# Patient Record
Sex: Female | Born: 1950 | Race: White | Hispanic: No | Marital: Married | Smoking: Never smoker
Health system: Southern US, Community
[De-identification: ages and names within clinical notes are randomized; demographics above are authoritative.]

## PROBLEM LIST (undated history)

## (undated) DIAGNOSIS — M1990 Unspecified osteoarthritis, unspecified site: Secondary | ICD-10-CM

## (undated) DIAGNOSIS — E119 Type 2 diabetes mellitus without complications: Secondary | ICD-10-CM

## (undated) DIAGNOSIS — F5104 Psychophysiologic insomnia: Secondary | ICD-10-CM

## (undated) DIAGNOSIS — G35 Multiple sclerosis: Secondary | ICD-10-CM

## (undated) DIAGNOSIS — G4730 Sleep apnea, unspecified: Secondary | ICD-10-CM

## (undated) DIAGNOSIS — Z8781 Personal history of (healed) traumatic fracture: Secondary | ICD-10-CM

## (undated) DIAGNOSIS — I10 Essential (primary) hypertension: Secondary | ICD-10-CM

## (undated) DIAGNOSIS — L719 Rosacea, unspecified: Secondary | ICD-10-CM

## (undated) DIAGNOSIS — G709 Myoneural disorder, unspecified: Secondary | ICD-10-CM

## (undated) DIAGNOSIS — M722 Plantar fascial fibromatosis: Secondary | ICD-10-CM

## (undated) DIAGNOSIS — H409 Unspecified glaucoma: Secondary | ICD-10-CM

## (undated) DIAGNOSIS — Z8711 Personal history of peptic ulcer disease: Secondary | ICD-10-CM

## (undated) DIAGNOSIS — Z87898 Personal history of other specified conditions: Secondary | ICD-10-CM

## (undated) DIAGNOSIS — R32 Unspecified urinary incontinence: Secondary | ICD-10-CM

## (undated) DIAGNOSIS — O00109 Unspecified tubal pregnancy without intrauterine pregnancy: Secondary | ICD-10-CM

## (undated) DIAGNOSIS — H269 Unspecified cataract: Secondary | ICD-10-CM

## (undated) DIAGNOSIS — M26609 Unspecified temporomandibular joint disorder, unspecified side: Secondary | ICD-10-CM

## (undated) DIAGNOSIS — G43909 Migraine, unspecified, not intractable, without status migrainosus: Secondary | ICD-10-CM

## (undated) DIAGNOSIS — T7840XA Allergy, unspecified, initial encounter: Secondary | ICD-10-CM

## (undated) DIAGNOSIS — E785 Hyperlipidemia, unspecified: Secondary | ICD-10-CM

## (undated) DIAGNOSIS — E669 Obesity, unspecified: Secondary | ICD-10-CM

## (undated) HISTORY — DX: Personal history of other specified conditions: Z87.898

## (undated) HISTORY — DX: Allergy, unspecified, initial encounter: T78.40XA

## (undated) HISTORY — DX: Rosacea, unspecified: L71.9

## (undated) HISTORY — PX: BRAIN SURGERY: SHX531

## (undated) HISTORY — PX: APPENDECTOMY: SHX54

## (undated) HISTORY — DX: Unspecified temporomandibular joint disorder, unspecified side: M26.609

## (undated) HISTORY — PX: GALLBLADDER SURGERY: SHX652

## (undated) HISTORY — DX: Obesity, unspecified: E66.9

## (undated) HISTORY — DX: Unspecified tubal pregnancy without intrauterine pregnancy: O00.109

## (undated) HISTORY — DX: Unspecified osteoarthritis, unspecified site: M19.90

## (undated) HISTORY — DX: Essential (primary) hypertension: I10

## (undated) HISTORY — DX: Plantar fascial fibromatosis: M72.2

## (undated) HISTORY — DX: Unspecified glaucoma: H40.9

## (undated) HISTORY — DX: Personal history of peptic ulcer disease: Z87.11

## (undated) HISTORY — PX: FRACTURE SURGERY: SHX138

## (undated) HISTORY — DX: Unspecified urinary incontinence: R32

## (undated) HISTORY — PX: EYE SURGERY: SHX253

## (undated) HISTORY — DX: Multiple sclerosis: G35

## (undated) HISTORY — DX: Sleep apnea, unspecified: G47.30

## (undated) HISTORY — DX: Myoneural disorder, unspecified: G70.9

## (undated) HISTORY — DX: Migraine, unspecified, not intractable, without status migrainosus: G43.909

## (undated) HISTORY — DX: Psychophysiologic insomnia: F51.04

## (undated) HISTORY — PX: CHOLECYSTECTOMY: SHX55

## (undated) HISTORY — DX: Personal history of (healed) traumatic fracture: Z87.81

## (undated) HISTORY — PX: OTHER SURGICAL HISTORY: SHX169

## (undated) HISTORY — DX: Hyperlipidemia, unspecified: E78.5

## (undated) HISTORY — DX: Type 2 diabetes mellitus without complications: E11.9

## (undated) HISTORY — DX: Unspecified cataract: H26.9

---

## 1992-08-13 LAB — HM PAP SMEAR

## 1995-03-03 LAB — HM PAP SMEAR

## 1997-10-28 ENCOUNTER — Ambulatory Visit (HOSPITAL_COMMUNITY): Admission: RE | Admit: 1997-10-28 | Discharge: 1997-10-28 | Payer: Self-pay | Admitting: Obstetrics and Gynecology

## 1998-04-27 ENCOUNTER — Encounter: Payer: Self-pay | Admitting: Obstetrics and Gynecology

## 1998-04-27 ENCOUNTER — Ambulatory Visit (HOSPITAL_COMMUNITY): Admission: RE | Admit: 1998-04-27 | Discharge: 1998-04-27 | Payer: Self-pay | Admitting: Obstetrics and Gynecology

## 1999-05-19 ENCOUNTER — Other Ambulatory Visit: Admission: RE | Admit: 1999-05-19 | Discharge: 1999-05-19 | Payer: Self-pay | Admitting: Family Medicine

## 1999-05-19 ENCOUNTER — Ambulatory Visit (HOSPITAL_COMMUNITY): Admission: RE | Admit: 1999-05-19 | Discharge: 1999-05-19 | Payer: Self-pay | Admitting: Family Medicine

## 1999-05-19 ENCOUNTER — Encounter: Payer: Self-pay | Admitting: Family Medicine

## 2000-05-30 ENCOUNTER — Encounter: Payer: Self-pay | Admitting: Family Medicine

## 2000-05-30 ENCOUNTER — Ambulatory Visit (HOSPITAL_COMMUNITY): Admission: RE | Admit: 2000-05-30 | Discharge: 2000-05-30 | Payer: Self-pay | Admitting: Family Medicine

## 2001-06-05 ENCOUNTER — Ambulatory Visit (HOSPITAL_COMMUNITY): Admission: RE | Admit: 2001-06-05 | Discharge: 2001-06-05 | Payer: Self-pay | Admitting: Family Medicine

## 2001-06-05 ENCOUNTER — Encounter: Payer: Self-pay | Admitting: Family Medicine

## 2002-07-11 ENCOUNTER — Encounter: Payer: Self-pay | Admitting: Family Medicine

## 2002-07-11 ENCOUNTER — Ambulatory Visit (HOSPITAL_COMMUNITY): Admission: RE | Admit: 2002-07-11 | Discharge: 2002-07-11 | Payer: Self-pay | Admitting: Family Medicine

## 2003-03-28 ENCOUNTER — Encounter: Payer: Self-pay | Admitting: Family Medicine

## 2003-03-28 ENCOUNTER — Ambulatory Visit (HOSPITAL_COMMUNITY): Admission: RE | Admit: 2003-03-28 | Discharge: 2003-03-28 | Payer: Self-pay | Admitting: Family Medicine

## 2003-05-11 ENCOUNTER — Ambulatory Visit (HOSPITAL_COMMUNITY): Admission: RE | Admit: 2003-05-11 | Discharge: 2003-05-11 | Payer: Self-pay | Admitting: Family Medicine

## 2003-05-11 ENCOUNTER — Encounter: Payer: Self-pay | Admitting: Family Medicine

## 2003-06-18 ENCOUNTER — Ambulatory Visit (HOSPITAL_COMMUNITY): Admission: RE | Admit: 2003-06-18 | Discharge: 2003-06-18 | Payer: Self-pay | Admitting: Neurology

## 2003-11-04 ENCOUNTER — Ambulatory Visit (HOSPITAL_COMMUNITY): Admission: RE | Admit: 2003-11-04 | Discharge: 2003-11-04 | Payer: Self-pay | Admitting: Neurology

## 2004-03-29 ENCOUNTER — Ambulatory Visit (HOSPITAL_COMMUNITY): Admission: RE | Admit: 2004-03-29 | Discharge: 2004-03-29 | Payer: Self-pay | Admitting: Obstetrics and Gynecology

## 2004-06-07 ENCOUNTER — Encounter: Admission: RE | Admit: 2004-06-07 | Discharge: 2004-06-07 | Payer: Self-pay | Admitting: Neurology

## 2004-09-23 ENCOUNTER — Ambulatory Visit: Payer: Self-pay | Admitting: Cardiology

## 2004-10-01 ENCOUNTER — Ambulatory Visit: Payer: Self-pay

## 2004-11-28 ENCOUNTER — Ambulatory Visit (HOSPITAL_COMMUNITY): Admission: RE | Admit: 2004-11-28 | Discharge: 2004-11-28 | Payer: Self-pay | Admitting: Neurology

## 2005-01-16 ENCOUNTER — Ambulatory Visit (HOSPITAL_COMMUNITY): Admission: RE | Admit: 2005-01-16 | Discharge: 2005-01-16 | Payer: Self-pay | Admitting: Neurology

## 2005-04-05 ENCOUNTER — Ambulatory Visit (HOSPITAL_COMMUNITY): Admission: RE | Admit: 2005-04-05 | Discharge: 2005-04-05 | Payer: Self-pay | Admitting: Obstetrics and Gynecology

## 2005-04-13 ENCOUNTER — Ambulatory Visit: Payer: Self-pay | Admitting: Cardiology

## 2005-04-19 ENCOUNTER — Ambulatory Visit: Payer: Self-pay | Admitting: Cardiology

## 2005-07-06 ENCOUNTER — Ambulatory Visit: Payer: Self-pay | Admitting: Cardiology

## 2005-09-14 ENCOUNTER — Ambulatory Visit: Payer: Self-pay | Admitting: Cardiology

## 2005-10-26 ENCOUNTER — Ambulatory Visit: Payer: Self-pay | Admitting: Cardiology

## 2006-01-07 ENCOUNTER — Emergency Department (HOSPITAL_COMMUNITY): Admission: EM | Admit: 2006-01-07 | Discharge: 2006-01-07 | Payer: Self-pay | Admitting: Family Medicine

## 2006-02-28 ENCOUNTER — Encounter: Admission: RE | Admit: 2006-02-28 | Discharge: 2006-05-29 | Payer: Self-pay | Admitting: Neurology

## 2006-04-07 ENCOUNTER — Ambulatory Visit (HOSPITAL_COMMUNITY): Admission: RE | Admit: 2006-04-07 | Discharge: 2006-04-07 | Payer: Self-pay | Admitting: Obstetrics and Gynecology

## 2006-04-13 ENCOUNTER — Ambulatory Visit: Payer: Self-pay | Admitting: Cardiology

## 2006-04-18 ENCOUNTER — Ambulatory Visit: Payer: Self-pay | Admitting: Cardiology

## 2006-05-03 ENCOUNTER — Ambulatory Visit: Payer: Self-pay | Admitting: Cardiology

## 2006-05-12 ENCOUNTER — Ambulatory Visit: Payer: Self-pay | Admitting: Cardiology

## 2006-06-02 ENCOUNTER — Encounter: Admission: RE | Admit: 2006-06-02 | Discharge: 2006-06-02 | Payer: Self-pay | Admitting: Orthopedic Surgery

## 2006-10-11 ENCOUNTER — Ambulatory Visit (HOSPITAL_COMMUNITY): Admission: RE | Admit: 2006-10-11 | Discharge: 2006-10-11 | Payer: Self-pay | Admitting: Neurology

## 2006-11-01 ENCOUNTER — Encounter
Admission: RE | Admit: 2006-11-01 | Discharge: 2007-01-30 | Payer: Self-pay | Admitting: Physical Medicine & Rehabilitation

## 2006-11-01 ENCOUNTER — Ambulatory Visit: Payer: Self-pay | Admitting: Physical Medicine & Rehabilitation

## 2007-01-17 ENCOUNTER — Ambulatory Visit: Payer: Self-pay | Admitting: Cardiology

## 2007-03-16 LAB — HM HEPATITIS C SCREENING LAB: HM Hepatitis Screen: NEGATIVE

## 2007-07-23 ENCOUNTER — Ambulatory Visit: Payer: Self-pay | Admitting: Cardiology

## 2007-08-09 ENCOUNTER — Encounter: Admission: RE | Admit: 2007-08-09 | Discharge: 2007-08-09 | Payer: Self-pay | Admitting: Family Medicine

## 2007-08-17 ENCOUNTER — Encounter: Admission: RE | Admit: 2007-08-17 | Discharge: 2007-08-17 | Payer: Self-pay | Admitting: Family Medicine

## 2008-01-16 ENCOUNTER — Ambulatory Visit: Payer: Self-pay | Admitting: Cardiology

## 2008-01-17 ENCOUNTER — Ambulatory Visit: Payer: Self-pay | Admitting: Cardiology

## 2008-01-17 LAB — CONVERTED CEMR LAB
BUN: 17 mg/dL (ref 6–23)
CO2: 31 meq/L (ref 19–32)
Creatinine, Ser: 0.9 mg/dL (ref 0.4–1.2)
Glucose, Bld: 106 mg/dL — ABNORMAL HIGH (ref 70–99)
LDL Cholesterol: 107 mg/dL — ABNORMAL HIGH (ref 0–99)
Potassium: 3.7 meq/L (ref 3.5–5.1)
Sodium: 142 meq/L (ref 135–145)
Total CHOL/HDL Ratio: 4.6
Total Protein: 7.2 g/dL (ref 6.0–8.3)
Triglycerides: 136 mg/dL (ref 0–149)
VLDL: 27 mg/dL (ref 0–40)

## 2008-08-23 LAB — HM PAP SMEAR

## 2008-12-26 ENCOUNTER — Telehealth (INDEPENDENT_AMBULATORY_CARE_PROVIDER_SITE_OTHER): Payer: Self-pay | Admitting: *Deleted

## 2009-01-14 ENCOUNTER — Encounter (INDEPENDENT_AMBULATORY_CARE_PROVIDER_SITE_OTHER): Payer: Self-pay | Admitting: *Deleted

## 2009-01-14 ENCOUNTER — Encounter: Payer: Self-pay | Admitting: Cardiology

## 2009-01-14 LAB — CONVERTED CEMR LAB
ALT: 50 units/L
BUN: 19 mg/dL
CO2: 23 meq/L
Creatinine, Ser: 1.05 mg/dL
Eosinophils Relative: 2 %
HCT: 39.3 %
Hemoglobin: 14 g/dL
Lymphocytes, automated: 39 %
MCV: 89 fL
Monocytes Relative: 9 %
Neutrophils Relative %: 49 %
Platelets: 194 10*3/uL
Potassium: 3.5 meq/L
RBC: 4.44 M/uL
Total Bilirubin: 0.2 mg/dL
Total Protein: 6.9 g/dL

## 2009-01-30 ENCOUNTER — Encounter: Payer: Self-pay | Admitting: Cardiology

## 2009-01-30 ENCOUNTER — Ambulatory Visit (HOSPITAL_COMMUNITY): Admission: RE | Admit: 2009-01-30 | Discharge: 2009-01-30 | Payer: Self-pay | Admitting: Neurology

## 2009-02-06 ENCOUNTER — Ambulatory Visit (HOSPITAL_COMMUNITY): Payer: Self-pay | Admitting: Licensed Clinical Social Worker

## 2009-02-16 ENCOUNTER — Ambulatory Visit (HOSPITAL_COMMUNITY): Payer: Self-pay | Admitting: Licensed Clinical Social Worker

## 2009-02-20 DIAGNOSIS — R0609 Other forms of dyspnea: Secondary | ICD-10-CM | POA: Insufficient documentation

## 2009-02-20 DIAGNOSIS — R5381 Other malaise: Secondary | ICD-10-CM

## 2009-02-20 DIAGNOSIS — R0989 Other specified symptoms and signs involving the circulatory and respiratory systems: Secondary | ICD-10-CM | POA: Insufficient documentation

## 2009-02-20 DIAGNOSIS — M129 Arthropathy, unspecified: Secondary | ICD-10-CM

## 2009-02-20 DIAGNOSIS — K219 Gastro-esophageal reflux disease without esophagitis: Secondary | ICD-10-CM

## 2009-02-20 DIAGNOSIS — R350 Frequency of micturition: Secondary | ICD-10-CM

## 2009-02-20 DIAGNOSIS — F3289 Other specified depressive episodes: Secondary | ICD-10-CM | POA: Insufficient documentation

## 2009-02-20 DIAGNOSIS — F329 Major depressive disorder, single episode, unspecified: Secondary | ICD-10-CM

## 2009-02-20 DIAGNOSIS — IMO0002 Reserved for concepts with insufficient information to code with codable children: Secondary | ICD-10-CM | POA: Insufficient documentation

## 2009-02-20 DIAGNOSIS — I10 Essential (primary) hypertension: Secondary | ICD-10-CM | POA: Insufficient documentation

## 2009-02-20 DIAGNOSIS — G35 Multiple sclerosis: Secondary | ICD-10-CM

## 2009-02-20 DIAGNOSIS — R5383 Other fatigue: Secondary | ICD-10-CM | POA: Insufficient documentation

## 2009-02-20 HISTORY — DX: Multiple sclerosis: G35

## 2009-02-20 HISTORY — DX: Major depressive disorder, single episode, unspecified: F32.9

## 2009-02-20 HISTORY — DX: Other malaise: R53.81

## 2009-02-20 HISTORY — DX: Gastro-esophageal reflux disease without esophagitis: K21.9

## 2009-02-20 HISTORY — DX: Arthropathy, unspecified: M12.9

## 2009-02-20 HISTORY — DX: Other specified depressive episodes: F32.89

## 2009-02-23 ENCOUNTER — Ambulatory Visit (HOSPITAL_COMMUNITY): Payer: Self-pay | Admitting: Licensed Clinical Social Worker

## 2009-03-02 ENCOUNTER — Ambulatory Visit: Payer: Self-pay | Admitting: Cardiology

## 2009-03-02 DIAGNOSIS — R072 Precordial pain: Secondary | ICD-10-CM | POA: Insufficient documentation

## 2009-03-02 DIAGNOSIS — R079 Chest pain, unspecified: Secondary | ICD-10-CM | POA: Insufficient documentation

## 2009-03-02 HISTORY — DX: Precordial pain: R07.2

## 2009-03-03 ENCOUNTER — Encounter (HOSPITAL_COMMUNITY): Admission: RE | Admit: 2009-03-03 | Discharge: 2009-06-01 | Payer: Self-pay | Admitting: Neurology

## 2009-03-10 ENCOUNTER — Ambulatory Visit (HOSPITAL_COMMUNITY): Payer: Self-pay | Admitting: Licensed Clinical Social Worker

## 2009-03-13 ENCOUNTER — Encounter (INDEPENDENT_AMBULATORY_CARE_PROVIDER_SITE_OTHER): Payer: Self-pay | Admitting: *Deleted

## 2009-03-20 ENCOUNTER — Telehealth: Payer: Self-pay | Admitting: Cardiology

## 2009-03-24 ENCOUNTER — Ambulatory Visit (HOSPITAL_COMMUNITY): Payer: Self-pay | Admitting: Licensed Clinical Social Worker

## 2009-04-14 ENCOUNTER — Ambulatory Visit (HOSPITAL_COMMUNITY): Payer: Self-pay | Admitting: Licensed Clinical Social Worker

## 2009-04-27 ENCOUNTER — Ambulatory Visit (HOSPITAL_COMMUNITY): Payer: Self-pay | Admitting: Licensed Clinical Social Worker

## 2009-05-06 ENCOUNTER — Ambulatory Visit (HOSPITAL_COMMUNITY): Payer: Self-pay | Admitting: Psychology

## 2009-05-14 ENCOUNTER — Ambulatory Visit (HOSPITAL_COMMUNITY): Payer: Self-pay | Admitting: Psychology

## 2009-05-20 ENCOUNTER — Ambulatory Visit (HOSPITAL_COMMUNITY): Payer: Self-pay | Admitting: Psychology

## 2009-05-27 ENCOUNTER — Ambulatory Visit (HOSPITAL_COMMUNITY): Payer: Self-pay | Admitting: Psychology

## 2009-06-09 ENCOUNTER — Ambulatory Visit (HOSPITAL_COMMUNITY): Payer: Self-pay | Admitting: Psychology

## 2009-06-22 ENCOUNTER — Encounter (HOSPITAL_COMMUNITY): Admission: RE | Admit: 2009-06-22 | Discharge: 2009-09-03 | Payer: Self-pay | Admitting: Psychiatry

## 2009-06-24 ENCOUNTER — Ambulatory Visit (HOSPITAL_COMMUNITY): Payer: Self-pay | Admitting: Psychology

## 2009-07-03 ENCOUNTER — Ambulatory Visit (HOSPITAL_COMMUNITY): Payer: Self-pay | Admitting: Psychology

## 2009-07-23 ENCOUNTER — Telehealth (INDEPENDENT_AMBULATORY_CARE_PROVIDER_SITE_OTHER): Payer: Self-pay | Admitting: *Deleted

## 2009-08-10 ENCOUNTER — Encounter: Payer: Self-pay | Admitting: Cardiology

## 2009-08-15 ENCOUNTER — Ambulatory Visit (HOSPITAL_COMMUNITY): Admission: RE | Admit: 2009-08-15 | Discharge: 2009-08-15 | Payer: Self-pay | Admitting: Neurology

## 2009-09-14 ENCOUNTER — Encounter (HOSPITAL_COMMUNITY): Admission: RE | Admit: 2009-09-14 | Discharge: 2009-12-13 | Payer: Self-pay | Admitting: Neurology

## 2009-09-24 ENCOUNTER — Telehealth (INDEPENDENT_AMBULATORY_CARE_PROVIDER_SITE_OTHER): Payer: Self-pay | Admitting: *Deleted

## 2009-10-27 ENCOUNTER — Telehealth (INDEPENDENT_AMBULATORY_CARE_PROVIDER_SITE_OTHER): Payer: Self-pay | Admitting: *Deleted

## 2009-10-29 ENCOUNTER — Telehealth (INDEPENDENT_AMBULATORY_CARE_PROVIDER_SITE_OTHER): Payer: Self-pay | Admitting: *Deleted

## 2009-11-23 LAB — HM PAP SMEAR

## 2009-12-16 ENCOUNTER — Encounter (HOSPITAL_COMMUNITY): Admission: RE | Admit: 2009-12-16 | Discharge: 2010-03-16 | Payer: Self-pay | Admitting: Neurology

## 2010-01-27 ENCOUNTER — Ambulatory Visit (HOSPITAL_COMMUNITY): Admission: RE | Admit: 2010-01-27 | Discharge: 2010-01-27 | Payer: Self-pay | Admitting: Neurology

## 2010-03-16 ENCOUNTER — Ambulatory Visit (HOSPITAL_COMMUNITY): Admission: RE | Admit: 2010-03-16 | Discharge: 2010-03-16 | Payer: Self-pay | Admitting: Neurology

## 2010-04-07 ENCOUNTER — Encounter (HOSPITAL_COMMUNITY): Admission: RE | Admit: 2010-04-07 | Discharge: 2010-07-06 | Payer: Self-pay | Admitting: Neurology

## 2010-05-04 ENCOUNTER — Ambulatory Visit: Payer: Self-pay | Admitting: Cardiology

## 2010-05-04 DIAGNOSIS — Z9119 Patient's noncompliance with other medical treatment and regimen: Secondary | ICD-10-CM

## 2010-05-04 DIAGNOSIS — E785 Hyperlipidemia, unspecified: Secondary | ICD-10-CM

## 2010-05-04 HISTORY — DX: Patient's noncompliance with other medical treatment and regimen: Z91.19

## 2010-05-04 HISTORY — DX: Hyperlipidemia, unspecified: E78.5

## 2010-05-17 ENCOUNTER — Telehealth (INDEPENDENT_AMBULATORY_CARE_PROVIDER_SITE_OTHER): Payer: Self-pay | Admitting: *Deleted

## 2010-05-22 LAB — LAB REPORT - SCANNED: EGFR (Non-African Amer.): 63

## 2010-07-06 ENCOUNTER — Encounter
Admission: RE | Admit: 2010-07-06 | Discharge: 2010-09-02 | Payer: Self-pay | Source: Home / Self Care | Attending: Neurology | Admitting: Neurology

## 2010-07-27 ENCOUNTER — Encounter (HOSPITAL_COMMUNITY)
Admission: RE | Admit: 2010-07-27 | Discharge: 2010-10-05 | Payer: Self-pay | Source: Home / Self Care | Attending: Neurology | Admitting: Neurology

## 2010-08-18 ENCOUNTER — Ambulatory Visit (HOSPITAL_COMMUNITY)
Admission: RE | Admit: 2010-08-18 | Discharge: 2010-08-18 | Payer: Self-pay | Source: Home / Self Care | Attending: Neurosurgery | Admitting: Neurosurgery

## 2010-08-25 ENCOUNTER — Inpatient Hospital Stay (HOSPITAL_COMMUNITY)
Admission: RE | Admit: 2010-08-25 | Discharge: 2010-08-25 | Payer: Self-pay | Source: Home / Self Care | Attending: Neurosurgery | Admitting: Neurosurgery

## 2010-09-24 ENCOUNTER — Encounter
Admission: RE | Admit: 2010-09-24 | Discharge: 2010-10-05 | Payer: Self-pay | Source: Home / Self Care | Attending: Neurology | Admitting: Neurology

## 2010-09-26 ENCOUNTER — Encounter: Payer: Self-pay | Admitting: Family Medicine

## 2010-09-26 ENCOUNTER — Encounter: Payer: Self-pay | Admitting: Neurology

## 2010-09-27 ENCOUNTER — Ambulatory Visit (HOSPITAL_COMMUNITY)
Admission: RE | Admit: 2010-09-27 | Discharge: 2010-09-27 | Payer: Self-pay | Source: Home / Self Care | Attending: Neurology | Admitting: Neurology

## 2010-09-28 ENCOUNTER — Ambulatory Visit
Admission: RE | Admit: 2010-09-28 | Discharge: 2010-09-28 | Payer: Self-pay | Source: Home / Self Care | Attending: Psychology | Admitting: Psychology

## 2010-10-05 ENCOUNTER — Ambulatory Visit: Admit: 2010-10-05 | Payer: Self-pay | Admitting: Psychology

## 2010-10-05 NOTE — Assessment & Plan Note (Signed)
Summary: per check out/sf  Medications Added TYSABRI 300 MG/15ML CONC (NATALIZUMAB) q monthly for MS ZONISAMIDE 100 MG CAPS (ZONISAMIDE) 1 tab once daily w/3 tabs of 25 to equal 175 mg total ZONISAMIDE 25 MG CAPS (ZONISAMIDE) 3 caps once daily w/100mg  tab to equal 175        Visit Type:  1 yr f/u Primary Provider:  Windle Guard MD  CC:  pt states she had cp x 2 wks back in May however states that she almost called our office but decided not to because she states she was not going to do what the Dr would have suggested so she decided not to call our office not go to the ED pt states she had sob and arm pain during this 2 wks.Marland KitchenMarland KitchenMarland KitchenMarland Kitchenpt lost 13 lb since 02/2009.  History of Present Illness: Ms. Gravlin returns today for further evaluation and management of her hypertension, chest pain, and mixed hyperlipidemia.  She tells today that she has had chest pain in May which he calls about. It was worrisome enough that we told her to go the emergency room. She decided not to go. She tells my clinical assistant today that she is not going to do with the doctors tell her to do regardless.  She will stop her atenolol so she can go on a multiple sclerosis drugs. Her blood pressures been under good control and she has lost 13 pounds since June of 2010. She brings in her blood pressure log today.  She's also stopped her simvastatin. She did not tell her primary care about this as well.    Current Medications (verified): 1)  Diovan 320 Mg Tabs (Valsartan) .Marland Kitchen.. 1 Tab Once Daily 2)  Atenolol-Chlorthalidone 50-25 Mg Tabs (Atenolol-Chlorthalidone) .... 1/2 Tab Every Morning 3)  Relpax 40 Mg Tabs (Eletriptan Hydrobromide) .... As Needed 4)  Tysabri 300 Mg/26ml Conc (Natalizumab) .... Q Monthly For Ms 5)  Zonisamide 100 Mg Caps (Zonisamide) .Marland Kitchen.. 1 Tab Once Daily W/3 Tabs of 25 To Equal 175 Mg Total 6)  Zonisamide 25 Mg Caps (Zonisamide) .... 3 Caps Once Daily W/100mg  Tab To Equal 175  Allergies: 1)  !  Niacin  Past History:  Past Medical History: Last updated: 02/20/2009 HYPERTENSION, UNSPECIFIED (ICD-401.9) DYSPNEA ON EXERTION (ICD-786.09) FATIGUE (ICD-780.79) GERD (ICD-530.81) DEPRESSION (ICD-311) ARTHRITIS (ICD-716.90) DEGENERATIVE DISC DISEASE (ICD-722.6) MULTIPLE SCLEROSIS (ICD-340) FREQUENCY, URINARY (ICD-788.41) Ulcer..during college yrs  Past Surgical History: Last updated: 02/20/2009 Lumbar puncture... 06/18/2003 ACUPUNCTURE TREATMENT #2.Marland Kitchen 11/07/2006 Acupuncture treatment #3... 11/10/2006 Acupuncture treatment #4.Marland Kitchen 11/17/2006 Appendectomy at age 23 Repair of leg Fx < 57yr Repair of depressed skull Fx < 65yr Cholecystectomy..1984 Ectopic pregnancy rupture..1984 Pylonidal cyst removal..1973 3 Vaginal births, 64, 48, 89  Family History: Last updated: 02/20/2009 Pt is adopted says birth family sketchy. Mother: alive and in her 60's..says mother and 2 sisters' have heart disease Father: deceased ?28's..liver cancer Siblings: 2 sisters deceased..1 sister..lung and brain ca                2nd sister MI and COPD                2 other sisters alive ? 80's                1 brother alive ? 37's  Social History: Last updated: 02/20/2009 Full Time Married  Tobacco Use - No.  Alcohol Use - no Regular Exercise - yes Drug Use - no  Risk Factors: Exercise: yes (02/20/2009)  Risk Factors: Smoking Status: never (02/20/2009)  Review of Systems       negative other than history of present illness  Vital Signs:  Patient profile:   60 year old female Height:      67 inches Weight:      168.8 pounds BMI:     26.53 Pulse rate:   65 / minute Pulse rhythm:   irregular BP sitting:   104 / 70  (left arm) Cuff size:   large  Vitals Entered By: Danielle Rankin, CMA (May 04, 2010 9:40 AM)  Physical Exam  General:  no acute distress, she has lost weight Head:  normocephalic and atraumatic Eyes:  PERRLA/EOM intact; conjunctiva and lids normal. Neck:  Neck supple,  no JVD. No masses, thyromegaly or abnormal cervical nodes. Chest Tenesia Escudero:  no deformities or breast masses noted Lungs:  Clear bilaterally to auscultation and percussion. Heart:  PMI nondisplaced, regular rate and rhythm, no murmur rub or gallop. Carotids equal bilaterally without bruit Msk:  decreased ROM.   Pulses:  pulses normal in all 4 extremities Extremities:  No clubbing or cyanosis. Neurologic:  Alert and oriented x 3. Skin:  Intact without lesions or rashes. Psych:  agitated.  alert oriented x3   Problems:  Medical Problems Added: 1)  Dx of Pers Hx Noncompliance W/med Tx [facility]s [facility]  (ICD-V15.81) 2)  Dx of Hyperlipidemia-mixed  (ICD-272.4)  Impression & Recommendations:  Problem # 1:  CHEST PAIN-PRECORDIAL (ICD-786.51) Assessment Improved She Has not had any further chest pain since May. She was advised at that time to go to the ED but refused. No further evaluation this time. The following medications were removed from the medication list:    Atenolol-chlorthalidone 50-25 Mg Tabs (Atenolol-chlorthalidone) .Marland Kitchen... 1/2 tab every morning  Problem # 2:  HYPERTENSION, UNSPECIFIED (ICD-401.9) Assessment: Improved Hopefully she will have good blood pressure control off the atenolol chlorthalidone. She will monitor this. The following medications were removed from the medication list:    Atenolol-chlorthalidone 50-25 Mg Tabs (Atenolol-chlorthalidone) .Marland Kitchen... 1/2 tab every morning Her updated medication list for this problem includes:    Diovan 320 Mg Tabs (Valsartan) .Marland Kitchen... 1 tab once daily  Orders: EKG w/ Interpretation (93000)  Problem # 3:  MULTIPLE SCLEROSIS (ICD-340) Hopefully coming off the beta block will allow her to go on p.o. multiple sclerosis drug.   Problem # 4:  HYPERLIPIDEMIA-MIXED (ICD-272.4) She has taken herself off the simvastatin. No blood work or followup necessary. The following medications were removed from the medication list:    Simvastatin 10 Mg  Tabs (Simvastatin) .Marland Kitchen... 1 tab once daily  Problem # 5:  PERS HX NONCOMPLIANCE W/MED TX PRS [facility] (ICD-V15.81)  Clinical Reports Reviewed:  Nuclear Study:  11/19/2003:  Interpretation: Clinically and electrocardiographically negative for ischemia. The patient did, however, have significant baseline hypertension with a hypertensive blood pressure response. Perfusion data were esssentially negative for ischemia. There was a small anteroapical defect, which likely represents breast attenuation. There was no associated Maija Biggers motion abnormality.  Learta Codding, MD   Patient Instructions: 1)  Your physician recommends that you schedule a follow-up appointment in: as needed with Dr. Daleen Squibb 2)  Your physician recommends that you continue on your current medications as directed. Please refer to the Current Medication list given to you today.

## 2010-10-05 NOTE — Progress Notes (Signed)
   Pt Completed ROI, I copied records and LMOM for her to Pick up. [facility] Mes[place]ore  May 17, 2010 9:48 AM

## 2010-10-05 NOTE — Progress Notes (Signed)
Summary: Records release to [facility]     Follow-up for Phone Call       Follow-up by: Lenard Forth    Attempts were made to resolve issue of records not being released in a timely matter. Second request received at [facility] 09/24/09, stamped as received by [employer] 09/28/09-ID# [id number]. Records processed 10/16/09, sent to [facility] in [place] for release. Records not released until 2/20. [facility] could not tell me a reason for the delay in releasing the information so I asked for a Supervisor to return my call. Pincus Badder from [facility] left a voicemail 2/23 and explained [place] had a snowstorm that hindered their processes.  Miss Tozzi came to [facility] Wednesday 2/23 to present the final request letter received from [facility].  After calling [facility] because patient had received third and final warning 2/19-postmarked 2/15, a decision was made to fax the records.Patient was notified after confirmation was received confirming receipt of records from [place] representing [employer] who will process her claim.  Appended Document: Records release to [facility] Received call back from [facility]. Bill for $55.65 will be written off due to exceeding time limits.

## 2010-10-05 NOTE — Progress Notes (Signed)
   Spoke with Pt.10/26/09 & 10/27/09, SHe has been trying to get answers on her Disability, [facility] says they have not recieved the records, in the system it says they were mailed out on 10/16/09, I have told pt that, she does not wont to leave a message she would like some answers.I have also spoken with Renne @ (10/27/09) [facility] she said all she can do is take a message and give it to [place] tomorrow when she returns.I told pt I will try and get some answers for her and return her call @ [phone]. I have aslo told her I will speak with Almira Coaster and see if something can be resolved. Cala Bradford Mesiemore  October 27, 2009 12:59 PM

## 2010-10-05 NOTE — Progress Notes (Signed)
   Pt.Dropped off a Disability paper from [facility] @ front Desk, She signed a ROI and I have forwarded to [facility]. Cala Bradford Mesiemore  September 24, 2009 10:46 AM

## 2010-10-11 ENCOUNTER — Encounter (HOSPITAL_COMMUNITY): Payer: 59 | Attending: Neurology

## 2010-10-11 DIAGNOSIS — G35 Multiple sclerosis: Secondary | ICD-10-CM | POA: Insufficient documentation

## 2010-10-11 DIAGNOSIS — Z79899 Other long term (current) drug therapy: Secondary | ICD-10-CM | POA: Insufficient documentation

## 2010-10-12 ENCOUNTER — Ambulatory Visit (INDEPENDENT_AMBULATORY_CARE_PROVIDER_SITE_OTHER): Payer: 59 | Admitting: Psychology

## 2010-10-12 DIAGNOSIS — F331 Major depressive disorder, recurrent, moderate: Secondary | ICD-10-CM

## 2010-10-13 ENCOUNTER — Ambulatory Visit: Payer: 59 | Attending: Neurology

## 2010-10-13 DIAGNOSIS — IMO0001 Reserved for inherently not codable concepts without codable children: Secondary | ICD-10-CM | POA: Insufficient documentation

## 2010-10-13 DIAGNOSIS — R269 Unspecified abnormalities of gait and mobility: Secondary | ICD-10-CM | POA: Insufficient documentation

## 2010-10-13 DIAGNOSIS — M6281 Muscle weakness (generalized): Secondary | ICD-10-CM | POA: Insufficient documentation

## 2010-10-19 ENCOUNTER — Ambulatory Visit (INDEPENDENT_AMBULATORY_CARE_PROVIDER_SITE_OTHER): Payer: 59 | Admitting: Psychology

## 2010-10-19 DIAGNOSIS — F331 Major depressive disorder, recurrent, moderate: Secondary | ICD-10-CM

## 2010-10-26 ENCOUNTER — Ambulatory Visit (INDEPENDENT_AMBULATORY_CARE_PROVIDER_SITE_OTHER): Payer: 59 | Admitting: Psychology

## 2010-10-26 DIAGNOSIS — F331 Major depressive disorder, recurrent, moderate: Secondary | ICD-10-CM

## 2010-11-03 ENCOUNTER — Ambulatory Visit (INDEPENDENT_AMBULATORY_CARE_PROVIDER_SITE_OTHER): Payer: 59 | Admitting: Psychology

## 2010-11-03 DIAGNOSIS — F331 Major depressive disorder, recurrent, moderate: Secondary | ICD-10-CM

## 2010-11-08 ENCOUNTER — Encounter (HOSPITAL_COMMUNITY)
Admission: RE | Admit: 2010-11-08 | Discharge: 2010-11-08 | Disposition: A | Discharge status: Home / Self Care | Payer: 59 | Source: Ambulatory Visit | Attending: Neurology | Admitting: Neurology

## 2010-11-08 DIAGNOSIS — Z79899 Other long term (current) drug therapy: Secondary | ICD-10-CM | POA: Insufficient documentation

## 2010-11-08 DIAGNOSIS — G35 Multiple sclerosis: Secondary | ICD-10-CM | POA: Insufficient documentation

## 2010-11-15 LAB — CBC
HCT: 39.4 % (ref 36.0–46.0)
Hemoglobin: 13.6 g/dL (ref 12.0–15.0)
MCH: 30.4 pg (ref 26.0–34.0)
RBC: 4.47 MIL/uL (ref 3.87–5.11)

## 2010-11-15 LAB — URINALYSIS, ROUTINE W REFLEX MICROSCOPIC
Hgb urine dipstick: NEGATIVE
Nitrite: NEGATIVE
Specific Gravity, Urine: 1.016 (ref 1.005–1.030)
Urobilinogen, UA: 0.2 mg/dL (ref 0.0–1.0)

## 2010-11-15 LAB — BASIC METABOLIC PANEL
BUN: 17 mg/dL (ref 6–23)
Calcium: 9.9 mg/dL (ref 8.4–10.5)
Creatinine, Ser: 0.97 mg/dL (ref 0.4–1.2)
GFR calc Af Amer: >60 mL/min (ref >60)
Sodium: 143 mEq/L (ref 135–145)

## 2010-11-15 LAB — SURGICAL PCR SCREEN
MRSA, PCR: NEGATIVE
Staphylococcus aureus: NEGATIVE

## 2010-11-15 LAB — URINE MICROSCOPIC-ADD ON

## 2010-11-22 LAB — CREATININE, SERUM: GFR calc non Af Amer: 55 mL/min — ABNORMAL LOW (ref >60)

## 2010-11-24 LAB — MISCELLANEOUS TEST

## 2010-12-06 ENCOUNTER — Encounter (HOSPITAL_COMMUNITY): Payer: 59 | Attending: Neurology

## 2010-12-06 DIAGNOSIS — G35 Multiple sclerosis: Secondary | ICD-10-CM | POA: Insufficient documentation

## 2011-01-03 ENCOUNTER — Encounter (HOSPITAL_COMMUNITY)
Admission: RE | Admit: 2011-01-03 | Discharge: 2011-01-03 | Payer: 59 | Source: Ambulatory Visit | Attending: Neurology | Admitting: Neurology

## 2011-01-06 ENCOUNTER — Ambulatory Visit (INDEPENDENT_AMBULATORY_CARE_PROVIDER_SITE_OTHER): Payer: 59 | Admitting: Psychology

## 2011-01-06 DIAGNOSIS — F331 Major depressive disorder, recurrent, moderate: Secondary | ICD-10-CM

## 2011-01-18 NOTE — Assessment & Plan Note (Signed)
[facility]                            CARDIOLOGY OFFICE NOTE   NAME:[name], Katherine [name]                         MRN:          [id number]  DATE:01/17/2007                            DOB:          1950-10-10    [name] returns today for further management of her hypertension.   She is having more and more difficulties at work.  She says she does not  think and do her job nearly as well as she used to.  She is getting  ready to retire.  The multiple sclerosis has really become an issue.  She has had a lot of multiple areas of pain, and has actually been  through some acupuncture.  Please refer to the notes from Dr. Wynn Banker.   Her blood pressure from her checking has been, on the average, 140  systolic, 85-90 diastolic.  She does have pressures that are in the  120s, 130s.   CURRENT MEDICATIONS:  1. Atenolol chlorthalidone 50/25 a half a tablet daily.  2. Effexor XR 150 mg a day.  3. Relpax 40 mg b.i.d. p.r.n.  4. Betaseron 0.25 mg injection every other day.  5. Diovan 320 mg a day.  6. Calcium and vitamin D.   PHYSICAL EXAMINATION:  She is very emotional and tearful today.  Her blood pressure is actually good at 127/80, her pulse is 70 and  regular.  EKG is normal.  Her weight is 197, up 11.  HEENT:  Normocephalic, atraumatic.  PERRLA.  Extraocular movements  intact.  Her sclerae are injected from crying.  Facial symmetry is  normal.  NECK:  Supple.  Carotids are full without bruits.  There is no  thyromegaly.  Trachea is midline.  LUNGS:  Remarkable for expiratory rhonchi in the upper airways (she has  had a respiratory tract infection).  HEART:  Reveals a soft S1, S2, without murmur, rub, or gallop.  ABDOMINAL EXAM:  Soft, good bowel sounds.  EXTREMITIES:  Reveal no edema.  Pulses are brisk.  NEURO EXAM:  Intact.   ASSESSMENT AND PLAN:  Katherine [name]'s blood pressure is still borderline.  I think a lot of this is related to a lot of the  emotional stress and  pain that she has gone through.  I have chosen not  to change her regimen today.  I think retiring, staying at home, doing  more exercise will help.  She will keep a check on this.  I will see her  back again in 6 months.     Thomas C. Daleen Squibb, MD, [facility]  Electronically Signed    TCW/MedQ  DD: 01/17/2007  DT: 01/17/2007  Job #: [id number]   cc:   Windle Guard, M.D.

## 2011-01-18 NOTE — Assessment & Plan Note (Signed)
[facility]                            CARDIOLOGY OFFICE NOTE   NAME:Mcgonagle, Elnita Maxwell                         MRN:          [id number]  DATE:07/23/2007                            DOB:          Aug 24, 1951    Ms. Chaviano returns today for further management of her hypertension.   She has gotten really serious about her weight and is down 13 pounds.  She feels remarkably better.  Her blood pressures have been running  around 130-140 systolic over about 80.  She does not check it near as  frequently.  she has no complaints today.  She states she is going to  lose even further weight.   Her meds are unchanged since last visit.  Her hypertensive meds  including Diovan 320 mg a day, atenolol, chlorthalidone 50/25 a half a  tablet q. day.   Her blood pressure today 139/81, her pulse is 64 and regular, weight is  184.  HEENT:  Unchanged.  Carotids are full without bruits.  No JVD.  Thyroid is not enlarged.  Trachea is midline.  LUNGS:  Clear.  HEART:  Regular rate and rhythm.  ABDOMEN:  Soft.  EXTREMITIES:  No cyanosis, clubbing, or edema.  Pulses are intact.  NEURO EXAM:  Grossly intact.   I am pleased with Mrs. Delmont's weight loss.  She seems to be happier  with herself and much more positive today than she was last visit.   I will plan on seeing her back again in 6 months.     Thomas C. Daleen Squibb, MD, [facility]  Electronically Signed    TCW/MedQ  DD: 07/23/2007  DT: 07/24/2007  Job #: [id number]

## 2011-01-18 NOTE — Assessment & Plan Note (Signed)
[facility]                            CARDIOLOGY OFFICE NOTE   NAME:[name], [name]                         MRN:          [id number]  DATE:01/16/2008                            DOB:          1950/09/20    Katherine [name] comes in today for further management of her hypertension.  Her blood pressures at home have been quite good, running around  130/about 80.   She continues to remain on Diovan 320 mg q.a.m., atenolol/chlorthalidone  50/25 one-half tablet q.a.m., and simvastatin 10 mg a day.  She is on  Rebif for her multiple sclerosis.  She takes vitamin B6.   She is way overdue blood work for her lipids and LFTs.  She says that  Dr. Jeannetta Nap is counting on me to draw these.   She denies any orthopnea, PND, or peripheral edema.  She really has  limitations with exercise because of her MS.  However, she had been  picking strawberries and doing preserves all day!   PHYSICAL EXAMINATION:  Her blood pressure, today, is 144/86; her pulse  72 and regular.  Weight is 188 and stable.  HEENT:  Unchanged.  Carotids were equal bilaterally without bruits, no JVD.  Thyroid is not  enlarged.  Trachea is midline.  LUNGS:  Clear.  HEART:  Reveals a nondisplaced PMI.  She has normal S1-S2 without  gallop.  ABDOMINAL EXAM: Soft, good bowel sounds.  EXTREMITIES:  Revealed no cyanosis, clubbing, or edema.  Pulses are  intact.  NEURO:  Exam is intact.   Katherine [name] is due blood work; however, she has not fasted.  We will have  her return for fasting lipids, LFTs, and a Chem-7.  I have made no  changes in her medical program since her blood pressure seems to be  under good control at home.  I will see her back, again, in a year.     Thomas C. Daleen Squibb, MD, [facility]  Electronically Signed    TCW/MedQ  DD: 01/16/2008  DT: 01/16/2008  Job #: [id number]   cc:   Windle Guard, M.D.

## 2011-01-21 NOTE — Op Note (Signed)
   Katherine Yu, Katherine Yu                            ACCOUNT NO.:  [id number]   MEDICAL RECORD NO.:  [id number]                   PATIENT TYPE:  OUT   LOCATION:  [facility]                                  FACILITY:  [facility]   PHYSICIAN:  Marlan Palau, M.D.               DATE OF BIRTH:  [birth year]   DATE OF PROCEDURE:  06/18/2003  DATE OF DISCHARGE:                                 OPERATIVE REPORT   PROCEDURE:  Lumbar puncture.   HISTORY:  This patient is a 60 year old with history of problems with  difficulty with concentration, weakness in the right greater than left upper  extremity, abnormal MRI scan. The patient is being evaluated for possible  demyelinating disease.   DESCRIPTION OF PROCEDURE:  The lumbar puncture was performed with the  patient in the fetal position on the right side. The low back was cleaned  with Betadine solution and approximately 2 mL of 1% Xylocaine was used as a  local anesthetic. A 20 gauge spinal needle was inserted in the L3-4  interspace and approximately 18 mL of clear colorless spinal fluid was  removed for testing. Opening pressure was 200 mm of water. Tube #1 was sent  for VDRL cryptococcal antigen angiotensin converting enzyme level, tube #2  was sent for oligoclonal banding, IgG albumin ration, tube #3 was sent for  cell differential, glucose and protein. Tube #4 was sent for Limes antibody  panel, blood work was also sent for ANA, rheumatoid factor, sed rate,  antiphospholipid antibody panel, homocystine level, lupus anticoagulant. The  patient tolerated the procedure well, no complications in the above  procedure were noted.                                               Marlan Palau, M.D.    CKW/MEDQ  D:  06/18/2003  T:  06/18/2003  Job:  [id number]

## 2011-01-21 NOTE — Procedures (Signed)
Katherine Yu, Katherine Yu                  ACCOUNT NO.:  [id number]   MEDICAL RECORD NO.:  [id number]           PATIENT TYPE:   LOCATION:                                 FACILITY:   PHYSICIAN:  Erick Colace, M.D.DATE OF BIRTH:  1951/05/09   DATE OF PROCEDURE:  11/10/2006  DATE OF DISCHARGE:                               OPERATIVE REPORT   PROCEDURE:  Acupuncture treatment #3.   ATTENDING:  Erick Colace, M.D.   INDICATION:  Right carpal tunnel syndrome, cervicalgia as well as  lumbago.   Pain score today 3/10.   TREATMENT:  Treatment today consisted of bilateral GB20, bilateral BL10,  right MH6, right MH7, right TH4, right TH5, right TH8 and bilateral  BL23, DL52 and GV4.  Electrical stimulation between points, 4 Hz for 30  minutes.  The patient tolerated the procedure well.  The patient was  checked, 14 needles in and 14 needles out.      Erick Colace, M.D.  Electronically Signed     AEK/MEDQ  D:  11/10/2006 16:04:31  T:  11/11/2006 07:17:16  Job:  [id number]

## 2011-01-21 NOTE — Procedures (Signed)
[name], [name]                  ACCOUNT NO.:  [id number]   MEDICAL RECORD NO.:  [id number]          PATIENT TYPE:  REC   LOCATION:  [facility]                          FACILITY:  [facility]   PHYSICIAN:  Erick Colace, M.D.DATE OF BIRTH:  04/21/1951   DATE OF PROCEDURE:  11/07/2006  DATE OF DISCHARGE:                               OPERATIVE REPORT   ACUPUNCTURE TREATMENT #2.   Last treatment performed February 29.  Pain score today 2/10, but  averaging 4 mainly neck and right wrist and hand, has some numbness,  tingling right hand.  Her treatment today consisted of bilateral GB20,  bilateral BL10 and GV14 as well as right MH6, right MH7, right TH4,  right TH5 and right TH8.  Electrical stimulation between TH4, MH7 and  TH5, TH8 as well as between GB20 and BL10 bilaterally 4 hertz x30  minutes.  The patient tolerated the procedure well.  Pre and post  procedure instructions given.  Ten needles in, 10 needles out.      Erick Colace, M.D.  Electronically Signed     AEK/MEDQ  D:  11/07/2006 09:10:27  T:  11/07/2006 10:42:25  Job:  [id number]   cc:   Marlan Palau, M.D.  Fax: [phone]

## 2011-01-21 NOTE — Group Therapy Note (Signed)
CHIEF COMPLAINT:  Neck pain, right thumb pain, right knee pain.   HISTORY:  A 60 year old female diagnosed with multiple sclerosis  approximately 4 years ago.  She thinks she may have had some symptoms  longer than that.  She had cervical degenerative disk disease as well  and states she has had neck pain for several years.  In addition, she  has developed some numbness of the hands and feet, had an EMG  demonstrating carpal tunnel, but lower extremity neuropathy.  She has  had knee pain diagnosed as degenerative joint disease of the right knee  and has arthritis of the right thumb joint, although she does not  remember exactly which of the thumb joints had the x-ray findings at Dr.  Carlos Levering office.   SOCIAL HISTORY:  She continues to work full time at [employer] and  has been there for many years.   She rates her average pain as 3/10.  Functional status:  She can walk 45  minutes 3 times a week.  She drives.  She climbs steps slowly.  Sometimes uses a cane.  Does not need any assistance with any household  duties or shopping.  Her pain improves with rest, heat, exercise, pacing  her activities, and medications.  She gets some physical therapy at  [facility].   REVIEW OF SYSTEMS:  Positive for numbness, tremor, tingling, spasms in  the legs, dizziness, confusion, nausea, respiratory infections,  coughing.   SOCIAL HISTORY:  Married.  Lives with her husband and 37 year old son.   FAMILY HISTORY:  Heart disease.  No high blood pressure, alcohol abuse,  rheumatoid arthritis, but states she was adopted so she does not know a  lot of her family history.   Blood pressure 119/66, respiratory rate 16, O2 saturation 97% on room  air, heart rate is 65 beats per minute.  She is a well-developed, well-nourished female in no acute distress.  Mood and affect are appropriate.  She has full strength bilateral upper and lower extremities.  She has no  evidence of ataxia and  finger to nose testing or on tandem gait.  She has no skin lesions on the trunk, arms, or legs.  She has mild  tenderness to palpation in the cervical paraspinal.  Her cervical range  of motion is good.  Lumbar range of motion is good as well.   REVIEW OF IMAGING STUDIES:  Include MRI of the brain demonstrate  moderately advanced white matter disease compatible with MS, without  significant changes from 2006, apparently diffuse gray-white junction.  C spine MRI showed no edema on any of the lesions of the cervical cord,  and only mild spondylosis at C5-6, C6-7.  She has had a right knee MRI  demonstrating degenerative changes mainly at the patellofemoral  compartment, and MRI of the lumbar spine December 26, 2004.  Minimal  degenerative disease L4-5.   IMPRESSION:  1. Mild cervical degenerative disk with spondylosis and cervicalgia.  2. Multiple sclerosis, maybe having some paresthesias from this, hands      and feet.  3. Right thumb pain.  Negative Finkelstein's.  No pain with carpal      metacarpal stress at the base of the thumb.   PLAN:  I believe a trial of acupuncture is a reasonable treatment for  her multiple pain issues.  Discussed the need for 1 to 2 time a week  treatments over the next couple weeks to fully assess efficacy.  Then we  could schedule  further treatments as needed.   We will do initial treatment today.   ADDENDUM:  Bilateral GB 20, GB 21 and BL 10, as well as right LI 4,  right LI 5, and right UE 5 electrostimulation through the sites, 4 Hz  x30 minutes.  The patient tolerated the procedure well pre-post  procedure instructions given.      Erick Colace, M.D.  Electronically Signed     AEK/MedQ  D:  11/02/2006 16:39:27  T:  11/02/2006 17:23:14  Job #:  [id number]   cc:   Marlan Palau, M.D.  Fax: [phone]   Loreta Ave, M.D.  Fax: [phone]   Dionne Ano. Everlene Other, M.D.  Fax: [phone]   [facility]   Buren Kos, M.D.   Fax: [phone]

## 2011-01-21 NOTE — Procedures (Signed)
NAMECEANNA, WAREING                  ACCOUNT NO.:  [id number]   MEDICAL RECORD NO.:  [id number]          PATIENT TYPE:  REC   LOCATION:  [facility]                          FACILITY:  [facility]   PHYSICIAN:  Erick Colace, M.D.DATE OF BIRTH:  12/05/1950   DATE OF PROCEDURE:  11/17/2006  DATE OF DISCHARGE:                               OPERATIVE REPORT   Acupuncture treatment #4 for cervicalgia, right knee pain due to  patellofemoral disorder, and right carpal tunnel syndrome.  The  treatment today consisted of bilateral GB-20, bilateral GB-21, right MH-  6, right MH-7, right TH-4, right TH-5, right TH-8, as well as the 4 eyes  of the knee on the right side.  4 Hz stimulation for 30 minutes, the  patient tolerated the procedure well.  In addition, a right shen men  placed auricular point without electrical stimulation, and patient  checked 10 needles in and 10 needles out.      Erick Colace, M.D.  Electronically Signed     AEK/MEDQ  D:  11/16/2006 13:45:47  T:  11/17/2006 22:30:27  Job:  [id number]

## 2011-01-21 NOTE — Assessment & Plan Note (Signed)
[facility]                              CARDIOLOGY OFFICE NOTE   NAME:Yu, [name]                         MRN:          [id number]  DATE:04/13/2006                            DOB:          11-23-1950    Katherine Yu comes in today for treatment of her hypertension.   She brings blood pressure readings from the last 6 months. Notably, her  blood pressures are on the average 130/80 and I would say even lower than  that over the last month or so.   She said that Dr. Jeannetta Nap wanted to know why I had her on 2 diuretics. We  added the furosemide 20 mg a day because she had some swelling. She has now  stopped this on her own. She is having no swelling.   CURRENT MEDICATIONS:  Atenolol, HCTZ 50/25 one-half tablet daily, Diovan 320  mg a day, simvastatin 10 mg a day, and potassium 20 mEq which she is now  taking p.r.n. She would like her potassium checked.   Her other big concern is that she says [facility] will not pay for Diovan. They  want to switch to lisinopril. I made it clear to her that these were  different classes of drugs, and I am sure she will develop a cough if I put  her on lisinopril.   PHYSICAL EXAMINATION:  Her exam today showed a blood pressure of 132/82,  pulse 76 and regular, weight 186 which is stable. The rest of her exam is  unchanged. She has no edema.   ASSESSMENT/PLAN:  Katherine Yu's blood pressure is under good control. I have  asked her to stay off the furosemide. If her potassium is normal, we will  discontinue her potassium. She is only taking it p.r.n. so I am not sure  what to make of any value. We will see her back again in 6 months.                               Thomas C. Daleen Squibb, MD, [facility]    TCW/MedQ  DD:  04/13/2006  DT:  04/13/2006  Job #:  [id number]

## 2011-01-25 ENCOUNTER — Ambulatory Visit (INDEPENDENT_AMBULATORY_CARE_PROVIDER_SITE_OTHER): Payer: 59 | Admitting: Psychology

## 2011-01-25 DIAGNOSIS — F331 Major depressive disorder, recurrent, moderate: Secondary | ICD-10-CM

## 2011-01-26 ENCOUNTER — Other Ambulatory Visit: Payer: Self-pay | Admitting: Family Medicine

## 2011-01-26 DIAGNOSIS — Z1231 Encounter for screening mammogram for malignant neoplasm of breast: Secondary | ICD-10-CM

## 2011-02-01 ENCOUNTER — Encounter (HOSPITAL_COMMUNITY)
Admission: RE | Admit: 2011-02-01 | Discharge: 2011-02-01 | Disposition: A | Discharge status: Home / Self Care | Payer: 59 | Source: Ambulatory Visit | Attending: Neurology | Admitting: Neurology

## 2011-02-01 DIAGNOSIS — G35 Multiple sclerosis: Secondary | ICD-10-CM | POA: Insufficient documentation

## 2011-02-04 ENCOUNTER — Ambulatory Visit
Admission: RE | Admit: 2011-02-04 | Discharge: 2011-02-04 | Disposition: A | Discharge status: Home / Self Care | Payer: 59 | Source: Ambulatory Visit | Attending: Family Medicine | Admitting: Family Medicine

## 2011-02-04 DIAGNOSIS — Z1231 Encounter for screening mammogram for malignant neoplasm of breast: Secondary | ICD-10-CM

## 2011-02-23 ENCOUNTER — Ambulatory Visit (INDEPENDENT_AMBULATORY_CARE_PROVIDER_SITE_OTHER): Payer: 59 | Admitting: Psychology

## 2011-02-23 DIAGNOSIS — F331 Major depressive disorder, recurrent, moderate: Secondary | ICD-10-CM

## 2011-03-01 ENCOUNTER — Encounter (HOSPITAL_COMMUNITY): Payer: 59 | Attending: Neurology

## 2011-03-01 DIAGNOSIS — G35 Multiple sclerosis: Secondary | ICD-10-CM | POA: Insufficient documentation

## 2011-03-03 ENCOUNTER — Ambulatory Visit: Payer: 59 | Admitting: Psychology

## 2011-03-29 ENCOUNTER — Encounter (HOSPITAL_COMMUNITY): Payer: 59 | Attending: Neurology

## 2011-03-29 DIAGNOSIS — G35 Multiple sclerosis: Secondary | ICD-10-CM | POA: Insufficient documentation

## 2011-04-26 ENCOUNTER — Encounter (HOSPITAL_COMMUNITY): Payer: 59 | Attending: Neurology

## 2011-04-26 ENCOUNTER — Ambulatory Visit: Payer: 59 | Admitting: Psychology

## 2011-04-26 DIAGNOSIS — G35 Multiple sclerosis: Secondary | ICD-10-CM | POA: Insufficient documentation

## 2011-05-06 ENCOUNTER — Other Ambulatory Visit: Payer: Self-pay | Admitting: Neurology

## 2011-05-06 DIAGNOSIS — R269 Unspecified abnormalities of gait and mobility: Secondary | ICD-10-CM

## 2011-05-06 DIAGNOSIS — G35 Multiple sclerosis: Secondary | ICD-10-CM

## 2011-05-06 DIAGNOSIS — M545 Low back pain: Secondary | ICD-10-CM

## 2011-05-24 ENCOUNTER — Encounter (HOSPITAL_COMMUNITY): Payer: 59

## 2011-06-01 ENCOUNTER — Ambulatory Visit
Admission: RE | Admit: 2011-06-01 | Discharge: 2011-06-01 | Disposition: A | Discharge status: Home / Self Care | Payer: 59 | Source: Ambulatory Visit | Attending: Neurology | Admitting: Neurology

## 2011-06-01 DIAGNOSIS — M545 Low back pain: Secondary | ICD-10-CM

## 2011-06-01 DIAGNOSIS — G35 Multiple sclerosis: Secondary | ICD-10-CM

## 2011-06-01 DIAGNOSIS — R269 Unspecified abnormalities of gait and mobility: Secondary | ICD-10-CM

## 2011-06-01 MED ORDER — GADOBENATE DIMEGLUMINE 529 MG/ML IV SOLN
15.0000 mL | Freq: Once | INTRAVENOUS | Status: AC | PRN
  Administered 2011-06-01: 15 mL via INTRAVENOUS

## 2011-08-12 ENCOUNTER — Other Ambulatory Visit (HOSPITAL_COMMUNITY): Payer: Self-pay | Admitting: Orthopedic Surgery

## 2011-08-12 DIAGNOSIS — M25561 Pain in right knee: Secondary | ICD-10-CM

## 2011-08-18 ENCOUNTER — Ambulatory Visit (HOSPITAL_COMMUNITY)
Admission: RE | Admit: 2011-08-18 | Discharge: 2011-08-18 | Disposition: A | Discharge status: Home / Self Care | Payer: 59 | Source: Ambulatory Visit | Attending: Orthopedic Surgery | Admitting: Orthopedic Surgery

## 2011-08-18 DIAGNOSIS — M25561 Pain in right knee: Secondary | ICD-10-CM

## 2011-08-18 DIAGNOSIS — M23329 Other meniscus derangements, posterior horn of medial meniscus, unspecified knee: Secondary | ICD-10-CM | POA: Insufficient documentation

## 2011-08-18 DIAGNOSIS — IMO0002 Reserved for concepts with insufficient information to code with codable children: Secondary | ICD-10-CM | POA: Insufficient documentation

## 2011-08-18 DIAGNOSIS — G35 Multiple sclerosis: Secondary | ICD-10-CM | POA: Insufficient documentation

## 2011-08-18 DIAGNOSIS — M25569 Pain in unspecified knee: Secondary | ICD-10-CM | POA: Insufficient documentation

## 2011-08-18 DIAGNOSIS — M7120 Synovial cyst of popliteal space [Baker], unspecified knee: Secondary | ICD-10-CM | POA: Insufficient documentation

## 2011-08-18 DIAGNOSIS — M25469 Effusion, unspecified knee: Secondary | ICD-10-CM | POA: Insufficient documentation

## 2011-08-18 DIAGNOSIS — M1710 Unilateral primary osteoarthritis, unspecified knee: Secondary | ICD-10-CM | POA: Insufficient documentation

## 2011-09-03 ENCOUNTER — Other Ambulatory Visit: Payer: Self-pay | Admitting: Orthopedic Surgery

## 2011-09-28 ENCOUNTER — Ambulatory Visit (HOSPITAL_BASED_OUTPATIENT_CLINIC_OR_DEPARTMENT_OTHER): Admission: RE | Admit: 2011-09-28 | Payer: 59 | Source: Ambulatory Visit | Admitting: Orthopedic Surgery

## 2011-09-28 ENCOUNTER — Encounter (HOSPITAL_BASED_OUTPATIENT_CLINIC_OR_DEPARTMENT_OTHER): Admission: RE | Payer: Self-pay | Source: Ambulatory Visit

## 2011-09-28 SURGERY — ARTHROSCOPY, KNEE
Anesthesia: Choice | Laterality: Right

## 2012-11-24 ENCOUNTER — Other Ambulatory Visit (HOSPITAL_COMMUNITY): Payer: Self-pay | Admitting: Neurology

## 2012-12-04 ENCOUNTER — Other Ambulatory Visit (HOSPITAL_COMMUNITY): Payer: Self-pay | Admitting: Neurology

## 2013-01-04 ENCOUNTER — Other Ambulatory Visit (HOSPITAL_COMMUNITY): Payer: Self-pay | Admitting: Neurology

## 2013-05-13 ENCOUNTER — Other Ambulatory Visit: Payer: Self-pay | Admitting: Neurology

## 2013-08-07 ENCOUNTER — Telehealth: Payer: Self-pay | Admitting: Neurology

## 2013-08-08 MED ORDER — INTERFERON BETA-1A 44 MCG/0.5ML ~~LOC~~ SOLN: 44.0000 ug | SUBCUTANEOUS | Status: DC

## 2013-08-08 NOTE — Telephone Encounter (Signed)
Rx has been sent  

## 2013-09-25 ENCOUNTER — Other Ambulatory Visit: Payer: Self-pay

## 2013-09-25 MED ORDER — ELETRIPTAN HYDROBROMIDE 40 MG PO TABS: 40.0000 mg | ORAL_TABLET | ORAL | Status: DC | PRN

## 2013-10-15 ENCOUNTER — Encounter: Payer: Self-pay | Admitting: Neurology

## 2013-10-15 ENCOUNTER — Ambulatory Visit (INDEPENDENT_AMBULATORY_CARE_PROVIDER_SITE_OTHER): Payer: 59 | Admitting: Neurology

## 2013-10-15 ENCOUNTER — Encounter: Payer: Self-pay | Admitting: *Deleted

## 2013-10-15 ENCOUNTER — Encounter (INDEPENDENT_AMBULATORY_CARE_PROVIDER_SITE_OTHER): Payer: Self-pay

## 2013-10-15 VITALS — BP 149/88 | HR 60 | Wt 168.5 lb

## 2013-10-15 DIAGNOSIS — G35 Multiple sclerosis: Secondary | ICD-10-CM

## 2013-10-15 NOTE — Patient Instructions (Signed)
Multiple Sclerosis  Multiple sclerosis (MS) is a disease of the central nervous system. It leads to loss of the insulating covering of the nerves (myelin sheath) of your brain. When this happens, brain signals do not get transmitted properly or may not get transmitted at all. The symptoms of MS occur in episodes or attacks. These attacks may last weeks to months. There may be long periods of nearly no problems between attacks. The age of onset of MS varies.   CAUSES  The cause of MS is unknown. However, it is more common in the [place] than in the southern [place].  RISK FACTORS  There is a higher incidence of MS in women than in men. MS is not an inherited illness, although your risk of MS is higher if you have a relative with MS.  SIGNS AND SYMPTOMS   The symptoms of MS occur in episodes or attacks. These attacks may last weeks to months. There may be long periods of almost no symptoms between attacks.  The symptoms of MS vary. This is because of the many different ways it affects the central nervous system. The main symptoms of MS include:   Vision problems and eye pain.   Numbness.   Weakness.   Paralysis in your arms, hands, feet, and legs (extremities).   Balance problems.   Tremors.  DIAGNOSIS   Your health care provider can diagnose MS with the help of imaging exams and lab tests. These may include specialized X-ray exams and spinal fluid tests. The best imaging exam to confirm a diagnosis of MS is MRI.  TREATMENT   There is no known cure for MS, but there are medicines that can decrease the number and frequency of attacks. Steroids are often used for short-term relief. Physical and occupational therapy may also help.  HOME CARE INSTRUCTIONS    Take medicines as directed by your health care provider.   Exercise as directed by your health care provider.  SEEK MEDICAL CARE IF:  You begin to feel depressed.  SEEK IMMEDIATE MEDICAL CARE IF:   You develop paralysis.   You develop  problems with bladder, bowel, or sexual function.   You develop mental changes, such as forgetfulness or mood swings.   You have a seizure.  Document Released: 08/19/2000 Document Revised: 06/12/2013 Document Reviewed: 04/29/2013  [employer] Patient Information 2014 [employer].

## 2013-10-15 NOTE — Progress Notes (Signed)
Reason for visit: Multiple sclerosis  Katherine Yu is an 63 y.o. female  History of present illness:  Katherine Yu is a 63 year old right-handed white female with a history of multiple sclerosis, currently on Rebif and doing well with this. The patient has not had any new clinical symptoms since last seen that involved numbness, weakness, bowel or bladder dysfunction, speech or vision changes. The patient is in general is tolerating the Rebif well. The patient will be getting annual blood work done through her primary care physician within the next several weeks. The patient indicates that her son has had a single white matter lesion, and he is being followed for this. The patient has not had any new other medical issues that have come up since last seen.  Past Medical History  Diagnosis Date  . MS (multiple sclerosis)   . Dyslipidemia   . Hypertension   . Migraine   . History of seizures     In childhood  . Mild obesity   . History of peptic ulcer   . Temporomandibular joint disease   . Tubal pregnancy   . History of lower leg fracture   . History of skull fracture   . Plantar fasciitis   . Degenerative arthritis   . Rosacea   . Chronic insomnia     Past Surgical History  Procedure Laterality Date  . Gallbladder surgery    . Appendectomy    . Pilonidal cyst    . Carpal tunnel release Bilateral   . Metal plate resection      skull    Family History  Problem Relation Age of Onset  . Adopted: Yes  . Lupus Sister   . Cancer Sister   . Heart attack Sister   . Heart attack Sister     Social history:  reports that she has never smoked. She has never used smokeless tobacco. She reports that she does not drink alcohol or use illicit drugs.    Allergies  Allergen Reactions  . Niacin     Medications:  Current Outpatient Prescriptions on File Prior to Visit  Medication Sig Dispense Refill  . eletriptan (RELPAX) 40 MG tablet Take 1 tablet (40 mg total) by mouth as  needed for migraine or headache. One tablet by mouth at onset of headache. May repeat in 2 hours if headache persists or recurs.  24 tablet  0  . interferon beta-1a (REBIF) 44 MCG/0.5ML injection Inject 0.5 mLs (44 mcg total) into the skin 3 (three) times a week.  6 mL  2   No current facility-administered medications on file prior to visit.    ROS:  Out of a complete 14 system review of symptoms, the patient complains only of the following symptoms, and all other reviewed systems are negative.  Fatigue Neck discomfort Eye itching, light sensitivity Heat intolerance Constipation, diarrhea Insomnia, frequent waking, daytime sleepiness, snoring, acting out dreams Environmental allergies Incontinence of bladder, urgency Joint pain, joint swelling, back pain, achy muscles, muscle cramps, walking difficulties, coordination problems Bruising easily Memory loss, headache, numbness, weakness Agitation, confusion, decreased concentration, depression, anxiety  Blood pressure 149/88, pulse 60, weight 168 lb 8 oz (76.431 kg).  Physical Exam  General: The patient is alert and cooperative at the time of the examination.  Skin: No significant peripheral edema is noted.   Neurologic Exam  Mental status: The patient is oriented x 2. Mini-Mental status examination done today shows a total score of 29/30.  Cranial nerves: Facial  symmetry is present. Speech is normal, no aphasia or dysarthria is noted. Extraocular movements are full. Visual fields are full. Pupils are equal, round, and reactive to light. Discs are flat bilaterally.  Motor: The patient has good strength in all 4 extremities.  Sensory examination: Soft touch sensation is symmetric on the face, arms, and legs.  Coordination: The patient has good finger-nose-finger and heel-to-shin bilaterally.  Gait and station: The patient has a normal gait. Tandem gait is slightly unsteady. Romberg is negative. No drift is seen.  Reflexes:  Deep tendon reflexes are symmetric.   Assessment/Plan:  1. Multiple sclerosis  The patient is to continue the Rebif for now. The patient is in general doing fairly well. Within the next 6 months, we will recheck a MRI of the brain to compare to one done in September of 2012. The patient otherwise will followup in one year.  [name] MD 10/15/2013 7:56 PM  [facility] [address] [place], [place] [address]  Phone [phone] Fax [phone]

## 2013-10-29 ENCOUNTER — Other Ambulatory Visit: Payer: Self-pay | Admitting: Neurology

## 2013-11-15 ENCOUNTER — Telehealth: Payer: Self-pay | Admitting: Neurology

## 2013-11-15 NOTE — Telephone Encounter (Signed)
Patient calling to request labwork results that Dr. Jannifer Franklin ordered which she had drawn at her PCP. Please call patient and advise.

## 2013-11-15 NOTE — Telephone Encounter (Signed)
I called patient. The patient had a CBC and liver function test done through her primary care physician. This is unremarkable with exception that the ALT is minimally elevated at 42. No other significant abnormalities are noted. I discussed this with patient.

## 2013-11-15 NOTE — Telephone Encounter (Signed)
Called patient and patient stated that she needed Dr. Jannifer Franklin to give her a call back concerning her labs work that she had drawn at her PCP. Patient wondering if Dr. Jannifer Franklin received the faxed results. Please advise.

## 2013-12-25 ENCOUNTER — Other Ambulatory Visit: Payer: Self-pay

## 2013-12-25 MED ORDER — ELETRIPTAN HYDROBROMIDE 40 MG PO TABS: 40.0000 mg | ORAL_TABLET | ORAL | Status: DC | PRN

## 2014-03-11 ENCOUNTER — Other Ambulatory Visit: Payer: Self-pay | Admitting: Neurology

## 2014-04-22 ENCOUNTER — Telehealth: Payer: Self-pay | Admitting: Neurology

## 2014-04-22 NOTE — Telephone Encounter (Signed)
I called patient. If the patient is amenable to having an MRI the brain, she is to contact our office, last study was in 2012.

## 2014-04-22 NOTE — Telephone Encounter (Signed)
Message copied by Margette Fast on Tue Apr 22, 2014  5:10 PM ------      Message from: Margette Fast      Created: Tue Oct 15, 2013  9:45 AM      Regarding: MRI followup       Recheck MRI brain with and without gadolinium enhancement. ------

## 2014-06-13 ENCOUNTER — Telehealth: Payer: Self-pay | Admitting: *Deleted

## 2014-06-13 NOTE — Telephone Encounter (Signed)
Form,[employer] to nurse 06-13-14.

## 2014-06-16 DIAGNOSIS — Z0289 Encounter for other administrative examinations: Secondary | ICD-10-CM

## 2014-06-24 NOTE — Telephone Encounter (Signed)
Forms completed by MD and were taken to our medical record department for processing.

## 2014-06-25 ENCOUNTER — Telehealth: Payer: Self-pay | Admitting: *Deleted

## 2014-06-25 NOTE — Telephone Encounter (Signed)
Form,[employer] received, completed from [place] faxed 06-25-14.

## 2014-07-23 ENCOUNTER — Encounter: Payer: Self-pay | Admitting: Neurology

## 2014-07-29 ENCOUNTER — Encounter: Payer: Self-pay | Admitting: Neurology

## 2014-10-15 ENCOUNTER — Encounter: Payer: Self-pay | Admitting: Neurology

## 2014-10-15 ENCOUNTER — Ambulatory Visit (INDEPENDENT_AMBULATORY_CARE_PROVIDER_SITE_OTHER): Payer: 59 | Admitting: Neurology

## 2014-10-15 VITALS — BP 192/87 | HR 67 | Ht 67.0 in | Wt 179.2 lb

## 2014-10-15 DIAGNOSIS — Z5181 Encounter for therapeutic drug level monitoring: Secondary | ICD-10-CM

## 2014-10-15 DIAGNOSIS — G35 Multiple sclerosis: Secondary | ICD-10-CM

## 2014-10-15 DIAGNOSIS — G43009 Migraine without aura, not intractable, without status migrainosus: Secondary | ICD-10-CM | POA: Insufficient documentation

## 2014-10-15 MED ORDER — ELETRIPTAN HYDROBROMIDE 40 MG PO TABS: 40.0000 mg | ORAL_TABLET | ORAL | Status: DC | PRN

## 2014-10-15 MED ORDER — HYDROXYZINE HCL 25 MG PO TABS: 25.0000 mg | ORAL_TABLET | Freq: Four times a day (QID) | ORAL | Status: DC | PRN

## 2014-10-15 MED ORDER — INTERFERON BETA-1A 44 MCG/0.5ML ~~LOC~~ SOSY: 44.0000 ug | PREFILLED_SYRINGE | SUBCUTANEOUS | Status: DC

## 2014-10-15 NOTE — Patient Instructions (Signed)

## 2014-10-15 NOTE — Progress Notes (Signed)
Reason for visit: Multiple sclerosis  Katherine Yu is an 64 y.o. female  History of present illness:  Katherine Yu is a 64 year old right-handed white female with a history of multiple sclerosis. The patient indicates that she has not had much change in her underlying clinical condition since last seen. She does indicate that her right arm feels somewhat heavy, and this issue began in March 2015. It has progressed slightly, which she has not had any functional change in her ability to use her arm. The patient is on Rebif, she has some flulike symptoms on the medication, but she does premedicate with Vistaril and Advil. This patient does report some problems with cognitive slowing, but she indicates that she is not sleeping well, her mind will race at nighttime. The patient has recently had some visual disturbance in the right eye that is felt to be related to problem with the retina, and she is followed by her ophthalmologist for this. The patient returns to this office for an evaluation. MRI evaluation was recommended on her last evaluation, but she did not wish to undergo the studies. The patient in the past has been on Tysabri, but she has a positive JC virus antibody. She had some cardiac issues, and she could not go on Gilenya previously. She does not wish to go on Aubagio or Tecfidera as she already has some issues with diarrhea.  Past Medical History  Diagnosis Date  . MS (multiple sclerosis)   . Dyslipidemia   . Hypertension   . Migraine   . History of seizures     In childhood  . Mild obesity   . History of peptic ulcer   . Temporomandibular joint disease   . Tubal pregnancy   . History of lower leg fracture   . History of skull fracture   . Plantar fasciitis   . Degenerative arthritis   . Rosacea   . Chronic insomnia     Past Surgical History  Procedure Laterality Date  . Gallbladder surgery    . Appendectomy    . Pilonidal cyst    . Carpal tunnel release Bilateral     . Metal plate resection      skull    Family History  Problem Relation Age of Onset  . Adopted: Yes  . Lupus Sister   . Cancer Sister   . Heart attack Sister   . Heart attack Sister     Social history:  reports that she has never smoked. She has never used smokeless tobacco. She reports that she does not drink alcohol or use illicit drugs.    Allergies  Allergen Reactions  . Niacin     Medications:  Current Outpatient Prescriptions on File Prior to Visit  Medication Sig Dispense Refill  . acetaminophen (TYLENOL) 650 MG CR tablet Take 650 mg by mouth every 8 (eight) hours as needed for pain.    Marland Kitchen aspirin 81 MG tablet Take 81 mg by mouth daily.    Marland Kitchen atenolol (TENORMIN) 100 MG tablet Take 100 mg by mouth daily.    . famotidine (PEPCID) 20 MG tablet Take 20 mg by mouth daily.    Marland Kitchen ibuprofen (ADVIL,MOTRIN) 200 MG tablet Take 200 mg by mouth every 6 (six) hours as needed.    . naproxen sodium (ANAPROX) 220 MG tablet Take 220 mg by mouth 2 (two) times daily with a meal.     No current facility-administered medications on file prior to visit.  ROS:  Out of a complete 14 system review of symptoms, the patient complains only of the following symptoms, and all other reviewed systems are negative.  Fatigue Ringing in the ears, runny nose Constipation, diarrhea, nausea Insomnia, frequent waking, daytime sleepiness, snoring Incontinence of the bladder, frequency of urination, urinary urgency Joint pain, back pain, achy muscles Itching Memory loss, dizziness, headache, numbness, weakness Agitation, confusion, depression  Blood pressure 192/87, pulse 67, height 5\' 7"  (1.702 m), weight 179 lb 3.2 oz (81.285 kg).  Physical Exam  General: The patient is alert and cooperative at the time of the examination. The patient is minimally to moderately obese.  Skin: No significant peripheral edema is noted.   Neurologic Exam  Mental status: The patient is oriented x 3.  Cranial  nerves: Facial symmetry is present. Speech is normal, no aphasia or dysarthria is noted. Extraocular movements are full. Visual fields are full. Pupils are equal, round, and react to light. Discs are flat bilaterally.  Motor: The patient has good strength in all 4 extremities.  Sensory examination: Soft touch sensation is symmetric on the face, arms, and legs.  Coordination: The patient has good finger-nose-finger and heel-to-shin bilaterally.  Gait and station: The patient has a normal gait. Tandem gait is slightly unsteady. Romberg is negative, but is unsteady. No drift is seen.  Reflexes: Deep tendon reflexes are symmetric.   Assessment/Plan:  1. Multiple sclerosis  2. Migraine headache  The patient clinically has done fairly well, but she does report some issues with cognitive processing. The patient is not sleeping well associated with chronic insomnia. The patient reports subjective heaviness of the right arm, no true weakness is seen. The patient will be set up for MRI evaluation of the brain and cervical spine. She will undergo blood work today. She will follow-up in one year, or sooner if needed. Prescriptions were given for the Rebif, Vistaril, and Relpax.  [name] MD 10/15/2014 10:31 PM  [facility] [address] [place],  [address]  Phone [phone] Fax [phone]

## 2014-10-16 ENCOUNTER — Telehealth: Payer: Self-pay | Admitting: Neurology

## 2014-10-16 LAB — CBC WITH DIFFERENTIAL/PLATELET
BASOS ABS: 0 10*3/uL (ref 0.0–0.2)
Basos: 1 %
EOS ABS: 0.1 10*3/uL (ref 0.0–0.4)
Eos: 2 %
HCT: 41.9 % (ref 34.0–46.6)
Hemoglobin: 14.2 g/dL (ref 11.1–15.9)
IMMATURE GRANULOCYTES: 0 %
Immature Grans (Abs): 0 10*3/uL (ref 0.0–0.1)
Lymphocytes Absolute: 1.5 10*3/uL (ref 0.7–3.1)
Lymphs: 36 %
MCH: 30.3 pg (ref 26.6–33.0)
MCHC: 33.9 g/dL (ref 31.5–35.7)
MCV: 89 fL (ref 79–97)
Monocytes Absolute: 0.4 10*3/uL (ref 0.1–0.9)
Monocytes: 9 %
NEUTROS PCT: 52 %
Neutrophils Absolute: 2.1 10*3/uL (ref 1.4–7.0)
PLATELETS: 180 10*3/uL (ref 150–379)
RBC: 4.69 x10E6/uL (ref 3.77–5.28)
RDW: 14.8 % (ref 12.3–15.4)
WBC: 4.1 10*3/uL (ref 3.4–10.8)

## 2014-10-16 LAB — COMPREHENSIVE METABOLIC PANEL
ALT: 58 IU/L — ABNORMAL HIGH (ref 0–32)
AST: 34 IU/L (ref 0–40)
Albumin/Globulin Ratio: 1.9 (ref 1.1–2.5)
Albumin: 4.6 g/dL (ref 3.6–4.8)
Alkaline Phosphatase: 84 IU/L (ref 39–117)
BUN/Creatinine Ratio: 19 (ref 11–26)
BUN: 15 mg/dL (ref 8–27)
Bilirubin Total: <0.2 mg/dL (ref 0.0–1.2)
CALCIUM: 9.2 mg/dL (ref 8.7–10.3)
CO2: 24 mmol/L (ref 18–29)
Chloride: 103 mmol/L (ref 97–108)
Creatinine, Ser: 0.78 mg/dL (ref 0.57–1.00)
GFR calc Af Amer: 95 mL/min/{1.73_m2} (ref >59)
GFR, EST NON AFRICAN AMERICAN: 82 mL/min/{1.73_m2} (ref >59)
GLOBULIN, TOTAL: 2.4 g/dL (ref 1.5–4.5)
GLUCOSE: 125 mg/dL — AB (ref 65–99)
Potassium: 4.6 mmol/L (ref 3.5–5.2)
SODIUM: 143 mmol/L (ref 134–144)
TOTAL PROTEIN: 7 g/dL (ref 6.0–8.5)

## 2014-10-16 NOTE — Telephone Encounter (Signed)
I called patient. The blood work shows a minimal elevation in the ALT level, of no significant concern. CBC was normal. The rest of the comprehensive metabolic profile was unremarkable. I discussed this with the patient.

## 2014-10-22 ENCOUNTER — Ambulatory Visit (INDEPENDENT_AMBULATORY_CARE_PROVIDER_SITE_OTHER): Payer: 59

## 2014-10-22 DIAGNOSIS — G35 Multiple sclerosis: Secondary | ICD-10-CM

## 2014-10-22 MED ORDER — GADOPENTETATE DIMEGLUMINE 469.01 MG/ML IV SOLN: 17.0000 mL | Freq: Once | INTRAVENOUS | Status: AC | PRN

## 2014-10-23 ENCOUNTER — Telehealth: Payer: Self-pay | Admitting: Neurology

## 2014-10-23 NOTE — Telephone Encounter (Signed)
I called the patient. The MRI of the cervical spine showed that the C4 cord lesion is no longer visible. There is some evidence of spondylosis apparent, disc bulges. The MRI the brain appears to be stable from 2012. The patient has moderate to severe involvement with MS plaques.    MRI cervical spine 10/22/2014:  IMPRESSION:  Abnormal MRI cervical spine (with and without) demonstrating: 1. At C5-6: disc bulging, leftward uncovertebral spurring, resulting in mild spinal stenosis, mild right and severe left foraminal stenosis. 2. At C3-4: disc bulging and facet hypertrophy with moderate-severe left foraminal stenosis. 3. At C6-7: disc bulging and uncovertebral joint hypertrophy with moderate biforaminal stenosis. 4. At C4-5: disc bulging with moderate left foraminal stenosis. 5. No intrinsic or abnormal enhancing spinal cord lesions. 6. Compared to MRI on 08/15/09, previously noted abnormality at C4 right hemicord is no longer seen. Otherwise no significant interval change.     MRI brain 10/22/14:  IMPRESSION:  Abnormal MRI brain (with and without) demonstrating: 1. Moderate periventricular and subcortical and peri-callosal chronic demyelinating plaques.  2. No acute plaques.  3. No significant change from MRI on 06/01/11.

## 2014-11-13 ENCOUNTER — Other Ambulatory Visit: Payer: 59

## 2015-06-11 ENCOUNTER — Other Ambulatory Visit: Payer: Self-pay | Admitting: Neurology

## 2015-10-16 ENCOUNTER — Ambulatory Visit: Payer: 59 | Admitting: Neurology

## 2015-10-21 ENCOUNTER — Encounter: Payer: Self-pay | Admitting: Adult Health

## 2015-10-21 ENCOUNTER — Ambulatory Visit (INDEPENDENT_AMBULATORY_CARE_PROVIDER_SITE_OTHER): Payer: 59 | Admitting: Adult Health

## 2015-10-21 VITALS — BP 137/84 | HR 66 | Ht 67.0 in | Wt 167.0 lb

## 2015-10-21 DIAGNOSIS — G35 Multiple sclerosis: Secondary | ICD-10-CM | POA: Diagnosis not present

## 2015-10-21 DIAGNOSIS — G43009 Migraine without aura, not intractable, without status migrainosus: Secondary | ICD-10-CM

## 2015-10-21 DIAGNOSIS — Z5181 Encounter for therapeutic drug level monitoring: Secondary | ICD-10-CM | POA: Diagnosis not present

## 2015-10-21 MED ORDER — ELETRIPTAN HYDROBROMIDE 40 MG PO TABS: 40.0000 mg | ORAL_TABLET | ORAL | Status: DC | PRN

## 2015-10-21 MED ORDER — INTERFERON BETA-1A 44 MCG/0.5ML ~~LOC~~ SOSY: 44.0000 ug | PREFILLED_SYRINGE | SUBCUTANEOUS | Status: DC

## 2015-10-21 MED ORDER — HYDROXYZINE PAMOATE 25 MG PO CAPS: 25.0000 mg | ORAL_CAPSULE | Freq: Four times a day (QID) | ORAL | Status: DC | PRN

## 2015-10-21 NOTE — Progress Notes (Signed)
I have read the note, and I agree with the clinical assessment and plan.  WILLIS,CHARLES KEITH   

## 2015-10-21 NOTE — Progress Notes (Signed)
PATIENT: Katherine Yu DOB: 02-18-1951  REASON FOR VISIT: follow up HISTORY FROM: patient  HISTORY OF PRESENT ILLNESS: Katherine Yu is a 65 year old female with a history of multiple sclerosis. She returns today for follow-up. She continues on rebif and tolerates it well. She states that back in the fall she noticed that she no longer had flulike symptoms after the injection. She states that ever since she was started on this medication she would have flulike symptoms and had to take Advil or Aleve. Patient states that she would also have itching and would use Vistaril. The patient denies any new numbness or weakness. Denies any changes with her gait or balance. She does state back in November she had an episode where the right leg beginning to cramp and when she stood it felt as if it was dead weight. She states this only lasted 1 day and it resolved. She had the same episode approximately 1 week later but again it resolves after one day and since then she has not had any additional episodes. The patient states that she recently saw her eye doctor and was referred to Dr. Zadie Rhine. She was diagnosed with a viscous floater. She states that she also has white flashes in the right periphery. The patient has had some issues with bladder and bowel incontinence. She states that occasionally she'll have episodes of incontinence. She states that she does feel that in the last year has become more frequent. She states that her headaches are usually clustered together. She states that she'll go several weeks without a headache and then have several headaches within 48 hours. She continues to use the Relpax with good benefit. She returns today for an evaluation.  HISTORY  10/15/14 (WILLIS): Katherine Yu is a 65 year old right-handed white female with a history of multiple sclerosis. The patient indicates that she has not had much change in her underlying clinical condition since last seen. She does indicate that her  right arm feels somewhat heavy, and this issue began in March 2015. It has progressed slightly, which she has not had any functional change in her ability to use her arm. The patient is on Rebif, she has some flulike symptoms on the medication, but she does premedicate with Vistaril and Advil. This patient does report some problems with cognitive slowing, but she indicates that she is not sleeping well, her mind will race at nighttime. The patient has recently had some visual disturbance in the right eye that is felt to be related to problem with the retina, and she is followed by her ophthalmologist for this. The patient returns to this office for an evaluation. MRI evaluation was recommended on her last evaluation, but she did not wish to undergo the studies. The patient in the past has been on Tysabri, but she has a positive JC virus antibody. She had some cardiac issues, and she could not go on Gilenya previously. She does not wish to go on Aubagio or Tecfidera as she already has some issues with diarrhea.  REVIEW OF SYSTEMS: Out of a complete 14 system review of symptoms, the patient complains only of the following symptoms, and all other reviewed systems are negative.  Chills, fatigue, runny nose, cough, blurred vision, eye itching, heat intolerance, flushing, diarrhea, nausea, insomnia, frequent waking, daytime sleepiness, snoring, joint pain, joint swelling, back pain, aching muscles, muscle cramps, neck pain, neck stiffness, frequency of urination, incontinence of bladder, memory loss, dizziness, headache  ALLERGIES: Allergies  Allergen Reactions  .  Niacin     HOME MEDICATIONS: Outpatient Prescriptions Prior to Visit  Medication Sig Dispense Refill  . acetaminophen (TYLENOL) 650 MG CR tablet Take 650 mg by mouth every 8 (eight) hours as needed for pain.    Marland Kitchen aspirin 81 MG tablet Take 81 mg by mouth daily.    Marland Kitchen atenolol (TENORMIN) 100 MG tablet Take 100 mg by mouth daily.    .  Cholecalciferol (VITAMIN D3) 5000 UNITS TABS Take 5,000 Units by mouth daily.    Marland Kitchen eletriptan (RELPAX) 40 MG tablet Take 1 tablet (40 mg total) by mouth as needed for migraine. May repeat in 2 hours if headache persists or recurs. 24 tablet 5  . famotidine (PEPCID) 20 MG tablet Take 20 mg by mouth daily.    . hydrOXYzine (VISTARIL) 25 MG capsule TAKE 1 TABLET BY MOUTH EVERY 6 HOURS AS NEEDED. 30 capsule 5  . interferon beta-1a (REBIF) 44 MCG/0.5ML SOSY injection Inject 0.5 mLs (44 mcg total) into the skin 3 (three) times a week. 0.5 mL 12  . Misc Natural Products (MIDNITE PO) Take by mouth at bedtime.    Vladimir Faster Glycol-Propyl Glycol 0.4-0.3 % GEL Apply to eye at bedtime.    . benazepril (LOTENSIN) 10 MG tablet Take 10 mg by mouth daily.    Vladimir Faster Glycol-Propyl Glycol (SYSTANE OP) Apply to eye daily.    Marland Kitchen ibuprofen (ADVIL,MOTRIN) 200 MG tablet Take 200 mg by mouth every 6 (six) hours as needed. Reported on 10/21/2015    . naproxen sodium (ANAPROX) 220 MG tablet Take 220 mg by mouth 2 (two) times daily with a meal. Reported on 10/21/2015     No facility-administered medications prior to visit.    PAST MEDICAL HISTORY: Past Medical History  Diagnosis Date  . Katherine (multiple sclerosis) ([place])   . Dyslipidemia   . Hypertension   . Migraine   . History of seizures     In childhood  . Mild obesity   . History of peptic ulcer   . Temporomandibular joint disease   . Tubal pregnancy   . History of lower leg fracture   . History of skull fracture   . Plantar fasciitis   . Degenerative arthritis   . Rosacea   . Chronic insomnia     PAST SURGICAL HISTORY: Past Surgical History  Procedure Laterality Date  . Gallbladder surgery    . Appendectomy    . Pilonidal cyst    . Carpal tunnel release Bilateral   . Metal plate resection      skull    FAMILY HISTORY: Family History  Problem Relation Age of Onset  . Adopted: Yes  . Lupus Sister   . Cancer Sister   . Heart attack Sister     . Heart attack Sister     SOCIAL HISTORY: Social History   Social History  . Marital Status: Married    Spouse Name: N/A  . Number of Children: 3  . Years of Education: Masters   Occupational History  . registered nurse    Social History Main Topics  . Smoking status: Never Smoker   . Smokeless tobacco: Never Used  . Alcohol Use: No  . Drug Use: No  . Sexual Activity: Not on file   Other Topics Concern  . Not on file   Social History Narrative   Patient is right handed.   Patient drinks 1 cup caffeine daily.      PHYSICAL EXAM  Filed Vitals:  10/21/15 0906  BP: 137/84  Pulse: 66  Height: 5\' 7"  (1.702 m)  Weight: 167 lb (75.751 kg)   Body mass index is 26.15 kg/(m^2).  Generalized: Well developed, in no acute distress   Neurological examination  Mentation: Alert oriented to time, place, history taking. Follows all commands speech and language fluent Cranial nerve II-XII: Pupils were equal round reactive to light. Extraocular movements were full, visual field were full on confrontational test. Facial sensation and strength were normal. Uvula tongue midline. Head turning and shoulder shrug  were normal and symmetric. Motor: The motor testing reveals 5 over 5 strength of all 4 extremities. Good symmetric motor tone is noted throughout.  Sensory: Sensory testing is intact to soft touch on all 4 extremities. No evidence of extinction is noted.  Coordination: Cerebellar testing reveals good finger-nose-finger and heel-to-shin bilaterally.  Gait and station: Gait is normal. Tandem gait is unsteady. Romberg is negative. No drift is seen.  Reflexes: Deep tendon reflexes are symmetric and normal bilaterally.   DIAGNOSTIC DATA (LABS, IMAGING, TESTING) - I reviewed patient records, labs, notes, testing and imaging myself where available.  Lab Results  Component Value Date   WBC 4.1 10/15/2014   HGB 14.2 10/15/2014   HCT 41.9 10/15/2014   MCV 89 10/15/2014   PLT  180 10/15/2014      Component Value Date/Time   NA 143 10/15/2014 0844   NA 143 08/24/2010 1003   K 4.6 10/15/2014 0844   CL 103 10/15/2014 0844   CO2 24 10/15/2014 0844   GLUCOSE 125* 10/15/2014 0844   GLUCOSE 115* 08/24/2010 1003   BUN 15 10/15/2014 0844   BUN 17 08/24/2010 1003   CREATININE 0.78 10/15/2014 0844   CALCIUM 9.2 10/15/2014 0844   PROT 7.0 10/15/2014 0844   PROT 6.9 01/14/2009   ALBUMIN 4.6 10/15/2014 0844   ALBUMIN 4.6 01/14/2009   AST 34 10/15/2014 0844   ALT 58* 10/15/2014 0844   ALKPHOS 84 10/15/2014 0844   BILITOT <0.2 10/15/2014 0844   BILITOT 0.2 01/14/2009   GFRNONAA 82 10/15/2014 0844   GFRAA 95 10/15/2014 0844       ASSESSMENT AND PLAN 65 y.o. year old female  has a past medical history of Katherine (multiple sclerosis) ([place]); Dyslipidemia; Hypertension; Migraine; History of seizures; Mild obesity; History of peptic ulcer; Temporomandibular joint disease; Tubal pregnancy; History of lower leg fracture; History of skull fracture; Plantar fasciitis; Degenerative arthritis; Rosacea; and Chronic insomnia. here with:  1. Multiple sclerosis 2. Migraine  headaches  The patient has remained relatively stable. She will continue on rebif. I will check blood work today. I will refill the patient's Relpax for migraine headaches and Vistaril for itching. At this time the patient does not want to repeat MRI of the brain or spine. Patient advised that if she develops any new symptoms she should let us know. She will follow-up in one year or sooner if needed.     Ward Givens, MSN, NP-C 10/21/2015, 9:24 AM [facility] [address] [place], [place] [address] [phone]

## 2015-10-21 NOTE — Patient Instructions (Signed)
Blood work today Continue Rebif If your symptoms worsen or you develop new symptoms please let us know.

## 2015-10-22 ENCOUNTER — Telehealth: Payer: Self-pay | Admitting: Adult Health

## 2015-10-22 LAB — CBC WITH DIFFERENTIAL/PLATELET
BASOS ABS: 0 10*3/uL (ref 0.0–0.2)
BASOS: 1 %
EOS (ABSOLUTE): 0.1 10*3/uL (ref 0.0–0.4)
EOS: 3 %
HEMOGLOBIN: 14.1 g/dL (ref 11.1–15.9)
Hematocrit: 43.2 % (ref 34.0–46.6)
IMMATURE GRANS (ABS): 0 10*3/uL (ref 0.0–0.1)
Immature Granulocytes: 0 %
LYMPHS: 40 %
Lymphocytes Absolute: 1.6 10*3/uL (ref 0.7–3.1)
MCH: 29.6 pg (ref 26.6–33.0)
MCHC: 32.6 g/dL (ref 31.5–35.7)
MCV: 91 fL (ref 79–97)
Monocytes Absolute: 0.3 10*3/uL (ref 0.1–0.9)
Monocytes: 7 %
NEUTROS ABS: 2.1 10*3/uL (ref 1.4–7.0)
Neutrophils: 49 %
Platelets: 193 10*3/uL (ref 150–379)
RBC: 4.77 x10E6/uL (ref 3.77–5.28)
RDW: 14.1 % (ref 12.3–15.4)
WBC: 4.1 10*3/uL (ref 3.4–10.8)

## 2015-10-22 LAB — COMPREHENSIVE METABOLIC PANEL
A/G RATIO: 2.1 (ref 1.1–2.5)
ALBUMIN: 4.7 g/dL (ref 3.6–4.8)
ALK PHOS: 69 IU/L (ref 39–117)
ALT: 47 IU/L — ABNORMAL HIGH (ref 0–32)
AST: 37 IU/L (ref 0–40)
BUN / CREAT RATIO: 21 (ref 11–26)
BUN: 15 mg/dL (ref 8–27)
Bilirubin Total: 0.3 mg/dL (ref 0.0–1.2)
CHLORIDE: 101 mmol/L (ref 96–106)
CO2: 26 mmol/L (ref 18–29)
Calcium: 9.6 mg/dL (ref 8.7–10.3)
Creatinine, Ser: 0.73 mg/dL (ref 0.57–1.00)
GFR calc non Af Amer: 89 mL/min/{1.73_m2} (ref >59)
GFR, EST AFRICAN AMERICAN: 102 mL/min/{1.73_m2} (ref >59)
GLUCOSE: 117 mg/dL — AB (ref 65–99)
Globulin, Total: 2.2 g/dL (ref 1.5–4.5)
POTASSIUM: 5 mmol/L (ref 3.5–5.2)
SODIUM: 142 mmol/L (ref 134–144)
TOTAL PROTEIN: 6.9 g/dL (ref 6.0–8.5)

## 2015-10-22 NOTE — Telephone Encounter (Signed)
-----   Message from Ward Givens, NP sent at 10/22/2015  7:33 AM EST ----- Lab work is ok. Please call patient.

## 2015-10-22 NOTE — Telephone Encounter (Signed)
Called and spoke to patient relayed labs were ok. Patient understood .

## 2015-11-13 DIAGNOSIS — I10 Essential (primary) hypertension: Secondary | ICD-10-CM | POA: Diagnosis not present

## 2015-11-13 DIAGNOSIS — Z0000 Encounter for general adult medical examination without abnormal findings: Secondary | ICD-10-CM | POA: Diagnosis not present

## 2015-11-23 DIAGNOSIS — S00211A Abrasion of right eyelid and periocular area, initial encounter: Secondary | ICD-10-CM | POA: Diagnosis not present

## 2016-01-26 ENCOUNTER — Ambulatory Visit: Payer: 59 | Attending: Internal Medicine | Admitting: Pharmacist

## 2016-01-26 DIAGNOSIS — G35 Multiple sclerosis: Secondary | ICD-10-CM

## 2016-01-26 MED ORDER — INTERFERON BETA-1A 44 MCG/0.5ML ~~LOC~~ SOSY: 44.0000 ug | PREFILLED_SYRINGE | SUBCUTANEOUS | Status: DC

## 2016-01-26 NOTE — Progress Notes (Addendum)
S: Patient presents today to the [facility].  Patient is currently taking Rebif for MS. Patient is managed by Dr. Jannifer Franklin for this. She reports that she is doing well with Rebif and has had no new issues or lesions in the last few years. She reports that she tolerates the Rebif well except for flu-like symptoms and injection-site reactions. She takes Aleve to prevent the flu-like symptoms and will take hydroxyzine for itching.   Adherence: denies any missed doses  Dosing: Rebif (subQ):44 mcg 3 times weekly (separate doses by at least 48 hours)  Dose adjustments: Hepatic: none neded  Drug-drug interactions: none  Monitoring: CBC: WNL LFTs: slightly elevated ALT but overall WNL Thyroid function tests: reports it is from outside lab and WNL Injection site reactions: reports but tolerates them S/sx of malignancy: denies Neuropsychiatric symptoms: denies Thrombotic microangiopathy (monitor for new onset HTN, thrombocytopenia, or impaired renal dysfunction): all labs and values WNL    O:     Lab Results  Component Value Date   WBC 4.1 10/21/2015   HGB 14.2 10/15/2014   HCT 43.2 10/21/2015   MCV 91 10/21/2015   PLT 193 10/21/2015      Chemistry      Component Value Date/Time   NA 142 10/21/2015 0954   NA 143 08/24/2010 1003   K 5.0 10/21/2015 0954   CL 101 10/21/2015 0954   CO2 26 10/21/2015 0954   BUN 15 10/21/2015 0954   BUN 17 08/24/2010 1003   CREATININE 0.73 10/21/2015 0954      Component Value Date/Time   CALCIUM 9.6 10/21/2015 0954   ALKPHOS 69 10/21/2015 0954   AST 37 10/21/2015 0954   ALT 47* 10/21/2015 0954   BILITOT 0.3 10/21/2015 0954   BILITOT 0.2 01/14/2009       A/P: 1. Medication review: Patient on Rebif for MS and is tolerating the medication well with the exception of flu-like symptoms (which she is able to manage with Aleve) and injection site reactions. Provided education on medication. No  recommendations for any changes. Patient to follow up with Dr. Jannifer Franklin as directed.    Nicoletta Ba, PharmD, BCPS, [facility] [phone]

## 2016-02-17 DIAGNOSIS — H04123 Dry eye syndrome of bilateral lacrimal glands: Secondary | ICD-10-CM | POA: Diagnosis not present

## 2016-02-17 DIAGNOSIS — H524 Presbyopia: Secondary | ICD-10-CM | POA: Diagnosis not present

## 2016-02-17 DIAGNOSIS — H40013 Open angle with borderline findings, low risk, bilateral: Secondary | ICD-10-CM | POA: Diagnosis not present

## 2016-06-06 ENCOUNTER — Telehealth: Payer: Self-pay

## 2016-06-06 NOTE — Telephone Encounter (Signed)
[facility] forms received, completed and signed.

## 2016-06-08 DIAGNOSIS — Z23 Encounter for immunization: Secondary | ICD-10-CM | POA: Diagnosis not present

## 2016-06-10 ENCOUNTER — Telehealth: Payer: Self-pay | Admitting: *Deleted

## 2016-06-10 NOTE — Telephone Encounter (Signed)
I called pt, I was unable to leave a voicemail. Pt form is ready for pick up.

## 2016-06-14 DIAGNOSIS — Z0289 Encounter for other administrative examinations: Secondary | ICD-10-CM

## 2016-06-28 ENCOUNTER — Other Ambulatory Visit: Payer: Self-pay | Admitting: Adult Health

## 2016-07-20 DIAGNOSIS — H43811 Vitreous degeneration, right eye: Secondary | ICD-10-CM | POA: Diagnosis not present

## 2016-07-20 DIAGNOSIS — H2513 Age-related nuclear cataract, bilateral: Secondary | ICD-10-CM | POA: Diagnosis not present

## 2016-07-20 DIAGNOSIS — H25013 Cortical age-related cataract, bilateral: Secondary | ICD-10-CM | POA: Diagnosis not present

## 2016-07-20 DIAGNOSIS — H40013 Open angle with borderline findings, low risk, bilateral: Secondary | ICD-10-CM | POA: Diagnosis not present

## 2016-10-20 ENCOUNTER — Encounter: Payer: Self-pay | Admitting: Adult Health

## 2016-10-20 ENCOUNTER — Other Ambulatory Visit: Payer: Self-pay | Admitting: Pharmacist

## 2016-10-20 ENCOUNTER — Ambulatory Visit (INDEPENDENT_AMBULATORY_CARE_PROVIDER_SITE_OTHER): Payer: 59 | Admitting: Adult Health

## 2016-10-20 VITALS — BP 138/88 | HR 64 | Ht 67.0 in | Wt 174.0 lb

## 2016-10-20 DIAGNOSIS — R519 Headache, unspecified: Secondary | ICD-10-CM

## 2016-10-20 DIAGNOSIS — G35 Multiple sclerosis: Secondary | ICD-10-CM

## 2016-10-20 DIAGNOSIS — R51 Headache: Secondary | ICD-10-CM | POA: Diagnosis not present

## 2016-10-20 DIAGNOSIS — L299 Pruritus, unspecified: Secondary | ICD-10-CM

## 2016-10-20 DIAGNOSIS — Z5181 Encounter for therapeutic drug level monitoring: Secondary | ICD-10-CM

## 2016-10-20 MED ORDER — ELETRIPTAN HYDROBROMIDE 40 MG PO TABS: 40.0000 mg | ORAL_TABLET | ORAL | 5 refills | Status: DC | PRN

## 2016-10-20 MED ORDER — INTERFERON BETA-1A 44 MCG/0.5ML ~~LOC~~ SOSY: 44.0000 ug | PREFILLED_SYRINGE | SUBCUTANEOUS | 9 refills | Status: DC

## 2016-10-20 MED ORDER — HYDROXYZINE PAMOATE 25 MG PO CAPS: ORAL_CAPSULE | ORAL | 5 refills | Status: DC

## 2016-10-20 NOTE — Progress Notes (Signed)
I have read the note, and I agree with the clinical assessment and plan.  Katherine Yu   

## 2016-10-20 NOTE — Progress Notes (Signed)
Faxed to [place] [facility] the medication request form(for rebif) that pt brought with her.  Information completed faxed and confirmation received.  [id number].  Also gave copy and fax confirmation sheet to pt.  [phone]. sy

## 2016-10-20 NOTE — Patient Instructions (Addendum)
Continue Rebif  Blood Work today If your symptoms worsen or you develop new symptoms please let us know.  If itching persists or becomes more frequent can consider antidepressant- Tricyclic antidepressant- nortriptyline

## 2016-10-20 NOTE — Progress Notes (Signed)
PATIENT: Katherine Yu DOB: 1950-12-15  REASON FOR VISIT: follow up- multiple sclerosis, headaches HISTORY FROM: patient  HISTORY OF PRESENT ILLNESS: Katherine Yu is a 66 year old female with a history of multiple sclerosis. She returns today for follow-up. She is currently on Rebif and tolerating it well. She denies any new symptoms. Denies any new numbness or weakness. No change in gait or balance. No change in her vision. No change in bowel or bladder. She states that she is having flulike symptoms and itching after taking Rebif. She uses ibuprofen and Vistaril with good benefit. She states that usually once a week she will have an episode of extreme itching. She reports that her headaches are under relatively good control. She states occasionally she'll have migraines that occur back-to-back but does respond to Relpax. She returns today for an evaluation.  HISTORY 10/21/15: Ms Katherine Yu is a 66 year old female with a history of multiple sclerosis. She returns today for follow-up. She continues on rebif and tolerates it well. She states that back in the fall she noticed that she no longer had flulike symptoms after the injection. She states that ever since she was started on this medication she would have flulike symptoms and had to take Advil or Aleve. Patient states that she would also have itching and would use Vistaril. The patient denies any new numbness or weakness. Denies any changes with her gait or balance. She does state back in November she had an episode where the right leg beginning to cramp and when she stood it felt as if it was dead weight. She states this only lasted 1 day and it resolved. She had the same episode approximately 1 week later but again it resolves after one day and since then she has not had any additional episodes. The patient states that she recently saw her eye doctor and was referred to Dr. Zadie Rhine. She was diagnosed with a viscous floater. She states that she also has  white flashes in the right periphery. The patient has had some issues with bladder and bowel incontinence. She states that occasionally she'll have episodes of incontinence. She states that she does feel that in the last year has become more frequent. She states that her headaches are usually clustered together. She states that she'll go several weeks without a headache and then have several headaches within 48 hours. She continues to use the Relpax with good benefit. She returns today for an evaluation.  HISTORY  10/15/14 (WILLIS): Katherine Yu is a 66 year old right-handed white female with a history of multiple sclerosis. The patient indicates that she has not had much change in her underlying clinical condition since last seen. She does indicate that her right arm feels somewhat heavy, and this issue began in March 2015. It has progressed slightly, which she has not had any functional change in her ability to use her arm. The patient is on Rebif, she has some flulike symptoms on the medication, but she does premedicate with Vistaril and Advil. This patient does report some problems with cognitive slowing, but she indicates that she is not sleeping well, her mind will race at nighttime. The patient has recently had some visual disturbance in the right eye that is felt to be related to problem with the retina, and she is followed by her ophthalmologist for this. The patient returns to this office for an evaluation. MRI evaluation was recommended on her last evaluation, but she did not wish to undergo the studies. The patient in  the past has been on Tysabri, but she has a positive JC virus antibody. She had some cardiac issues, and she could not go on Gilenya previously. She does not wish to go on Aubagio or Tecfidera as she already has some issues with diarrhea.   REVIEW OF SYSTEMS: Out of a complete 14 system review of symptoms, the patient complains only of the following symptoms, and all other reviewed  systems are negative.  Fatigue, ringing in ears, diarrhea, nausea, insomnia, daytime sleepiness, joint pain, back pain, aching muscles, muscle cramps, walking difficulty, itching, agitation, confusion, incontinence of bladder, frequency of urination, memory loss, dizziness, headache, heat intolerance  ALLERGIES: Allergies  Allergen Reactions  . Niacin Anaphylaxis, Hives and Itching    HOME MEDICATIONS: Outpatient Medications Prior to Visit  Medication Sig Dispense Refill  . acetaminophen (TYLENOL) 650 MG CR tablet Take 650 mg by mouth every 8 (eight) hours as needed for pain.    Marland Kitchen aspirin 81 MG tablet Take 81 mg by mouth daily.    Marland Kitchen atenolol (TENORMIN) 100 MG tablet Take 100 mg by mouth daily.    . benazepril (LOTENSIN) 40 MG tablet Take 40 mg by mouth daily.   4  . Cholecalciferol (VITAMIN D3) 5000 UNITS TABS Take 5,000 Units by mouth daily.    Marland Kitchen eletriptan (RELPAX) 40 MG tablet Take 1 tablet (40 mg total) by mouth as needed for migraine. May repeat in 2 hours if headache persists or recurs. 24 tablet 5  . famotidine (PEPCID) 20 MG tablet Take 20 mg by mouth daily.    . hydrochlorothiazide (HYDRODIURIL) 50 MG tablet Take 50 mg by mouth daily.   0  . hydrOXYzine (VISTARIL) 25 MG capsule TAKE 1 CAPSULE BY MOUTH EVERY 6 HOURS AS NEEDED. 30 capsule 5  . interferon beta-1a (REBIF) 44 MCG/0.5ML SOSY injection Inject 0.5 mLs (44 mcg total) into the skin 3 (three) times a week. 0.5 mL 9  . Misc Natural Products (MIDNITE PO) Take by mouth at bedtime.    Vladimir Faster Glycol-Propyl Glycol 0.4-0.3 % GEL Apply to eye at bedtime.     No facility-administered medications prior to visit.     PAST MEDICAL HISTORY: Past Medical History:  Diagnosis Date  . Chronic insomnia   . Degenerative arthritis   . Dyslipidemia   . History of lower leg fracture   . History of peptic ulcer   . History of seizures    In childhood  . History of skull fracture   . Hypertension   . Migraine   . Mild obesity     . MS (multiple sclerosis) ([place])   . Plantar fasciitis   . Rosacea   . Temporomandibular joint disease   . Tubal pregnancy     PAST SURGICAL HISTORY: Past Surgical History:  Procedure Laterality Date  . APPENDECTOMY    . CARPAL TUNNEL RELEASE Bilateral   . GALLBLADDER SURGERY    . metal plate resection     skull  . pilonidal cyst      FAMILY HISTORY: Family History  Problem Relation Age of Onset  . Adopted: Yes  . Lupus Sister   . Cancer Sister   . Heart attack Sister   . Heart attack Sister     SOCIAL HISTORY: Social History   Social History  . Marital status: Married    Spouse name: N/A  . Number of children: 3  . Years of education: Masters   Occupational History  . registered nurse  Social History Main Topics  . Smoking status: Never Smoker  . Smokeless tobacco: Never Used  . Alcohol use No  . Drug use: No  . Sexual activity: Not on file   Other Topics Concern  . Not on file   Social History Narrative   Patient is right handed.   Patient drinks 1 cup caffeine daily.      PHYSICAL EXAM  Vitals:   10/20/16 0808  BP: 138/88  Pulse: 64  Weight: 174 lb (78.9 kg)  Height: 5\' 7"  (1.702 m)   Body mass index is 27.25 kg/m.  Generalized: Well developed, in no acute distress   Neurological examination  Mentation: Alert oriented to time, place, history taking. Follows all commands speech and language fluent Cranial nerve II-XII: Pupils were equal round reactive to light. Extraocular movements were full, visual field were full on confrontational test. Facial sensation and strength were normal. Uvula tongue midline. Head turning and shoulder shrug  were normal and symmetric. Motor: The motor testing reveals 5 over 5 strength of all 4 extremities. Good symmetric motor tone is noted throughout.  Sensory: Sensory testing is intact to soft touch on all 4 extremities. No evidence of extinction is noted.  Coordination: Cerebellar testing reveals good  finger-nose-finger and heel-to-shin bilaterally.  Gait and station: Gait is normal. Tandem gait is unsteady. Romberg is negative. No drift is seen.  Reflexes: Deep tendon reflexes are symmetric and normal bilaterally.   DIAGNOSTIC DATA (LABS, IMAGING, TESTING) - I reviewed patient records, labs, notes, testing and imaging myself where available.  Lab Results  Component Value Date   WBC 4.1 10/21/2015   HGB 14.2 10/15/2014   HCT 43.2 10/21/2015   MCV 91 10/21/2015   PLT 193 10/21/2015      Component Value Date/Time   NA 142 10/21/2015 0954   K 5.0 10/21/2015 0954   CL 101 10/21/2015 0954   CO2 26 10/21/2015 0954   GLUCOSE 117 (H) 10/21/2015 0954   GLUCOSE 115 (H) 08/24/2010 1003   BUN 15 10/21/2015 0954   CREATININE 0.73 10/21/2015 0954   CALCIUM 9.6 10/21/2015 0954   PROT 6.9 10/21/2015 0954   ALBUMIN 4.7 10/21/2015 0954   AST 37 10/21/2015 0954   ALT 47 (H) 10/21/2015 0954   ALKPHOS 69 10/21/2015 0954   BILITOT 0.3 10/21/2015 0954   GFRNONAA 89 10/21/2015 0954   GFRAA 102 10/21/2015 0954       ASSESSMENT AND PLAN 66 y.o. year old female  has a past medical history of Chronic insomnia; Degenerative arthritis; Dyslipidemia; History of lower leg fracture; History of peptic ulcer; History of seizures; History of skull fracture; Hypertension; Migraine; Mild obesity; MS (multiple sclerosis) ([place]); Plantar fasciitis; Rosacea; Temporomandibular joint disease; and Tubal pregnancy. here with:  1. Multiple sclerosis 2. Headaches 3. Pruritus  The patient will continue on Rebif. I will check blood work today. The patient does not want a repeat MRI of the brain and cervical spine at this time. The patient does have chronic pruritus not always associated with her Rebif injection. The pruritus does not always respond to Vistaril. We discussed other medications such as a tricyclic antidepressant for the itching. At this time the patient does not want to try any new medication. Advised  that if her symptoms worsen or she develops new symptoms she should let us know. She will follow-up in 1 year or sooner if needed.     Ward Givens, MSN, NP-C 10/20/2016, 8:10 AM [facility] [address] [place], [place] [address] [phone]

## 2016-10-21 LAB — COMPREHENSIVE METABOLIC PANEL
ALBUMIN: 4.5 g/dL (ref 3.6–4.8)
ALK PHOS: 65 IU/L (ref 39–117)
ALT: 74 IU/L — ABNORMAL HIGH (ref 0–32)
AST: 62 IU/L — ABNORMAL HIGH (ref 0–40)
Albumin/Globulin Ratio: 1.8 (ref 1.2–2.2)
BUN / CREAT RATIO: 19 (ref 12–28)
BUN: 19 mg/dL (ref 8–27)
Bilirubin Total: 0.4 mg/dL (ref 0.0–1.2)
CALCIUM: 9.9 mg/dL (ref 8.7–10.3)
CO2: 18 mmol/L (ref 18–29)
CREATININE: 1.01 mg/dL — AB (ref 0.57–1.00)
Chloride: 101 mmol/L (ref 96–106)
GFR calc Af Amer: 68 mL/min/{1.73_m2} (ref >59)
GFR, EST NON AFRICAN AMERICAN: 59 mL/min/{1.73_m2} — AB (ref >59)
GLUCOSE: 139 mg/dL — AB (ref 65–99)
Globulin, Total: 2.5 g/dL (ref 1.5–4.5)
Potassium: 5.3 mmol/L — ABNORMAL HIGH (ref 3.5–5.2)
Sodium: 143 mmol/L (ref 134–144)
TOTAL PROTEIN: 7 g/dL (ref 6.0–8.5)

## 2016-10-21 LAB — CBC WITH DIFFERENTIAL/PLATELET
Basophils Absolute: 0 10*3/uL (ref 0.0–0.2)
Basos: 0 %
EOS (ABSOLUTE): 0.2 10*3/uL (ref 0.0–0.4)
EOS: 3 %
HEMOGLOBIN: 14.6 g/dL (ref 11.1–15.9)
Hematocrit: 41.7 % (ref 34.0–46.6)
IMMATURE GRANULOCYTES: 0 %
Immature Grans (Abs): 0 10*3/uL (ref 0.0–0.1)
LYMPHS ABS: 1.4 10*3/uL (ref 0.7–3.1)
Lymphs: 28 %
MCH: 31.5 pg (ref 26.6–33.0)
MCHC: 35 g/dL (ref 31.5–35.7)
MCV: 90 fL (ref 79–97)
Monocytes Absolute: 0.6 10*3/uL (ref 0.1–0.9)
Monocytes: 12 %
NEUTROS PCT: 57 %
Neutrophils Absolute: 2.7 10*3/uL (ref 1.4–7.0)
Platelets: 160 10*3/uL (ref 150–379)
RBC: 4.63 x10E6/uL (ref 3.77–5.28)
RDW: 14.4 % (ref 12.3–15.4)
WBC: 4.9 10*3/uL (ref 3.4–10.8)

## 2016-10-24 ENCOUNTER — Telehealth: Payer: Self-pay | Admitting: *Deleted

## 2016-10-24 ENCOUNTER — Encounter: Payer: Self-pay | Admitting: *Deleted

## 2016-10-24 DIAGNOSIS — H43813 Vitreous degeneration, bilateral: Secondary | ICD-10-CM | POA: Diagnosis not present

## 2016-10-24 DIAGNOSIS — H5319 Other subjective visual disturbances: Secondary | ICD-10-CM | POA: Diagnosis not present

## 2016-10-24 DIAGNOSIS — H43393 Other vitreous opacities, bilateral: Secondary | ICD-10-CM | POA: Diagnosis not present

## 2016-10-24 NOTE — Telephone Encounter (Signed)
Per Edman Circle, nN, spoke with patient and informed her that her lab results showed AST and ALT slightly elevated. She stated she had just pulled up her results from my chart. Advised her Jinny Blossom will  recheck in 1 month and call her when she is due to for blood work.  She stated she was going to mail a copy to her PCP due to elevated fasting glucose. She verbalized understanding, appreciation for call.

## 2016-10-24 NOTE — Progress Notes (Signed)
Faxed further information needed from [facility] verifying patient has diagnosis of a relapsing form of MS. [phone]. Received confirmation. CW,MD signed.

## 2016-10-26 NOTE — Progress Notes (Signed)
Received notice from [facility] that [place] rebif approved and is effective from 10/26/2016-10/25/2017. Approved for max of 12 refills on 22mL (12 syringes) per 28 days.  PA ref #: [id number]

## 2016-11-10 DIAGNOSIS — H43393 Other vitreous opacities, bilateral: Secondary | ICD-10-CM | POA: Diagnosis not present

## 2016-11-10 DIAGNOSIS — H43813 Vitreous degeneration, bilateral: Secondary | ICD-10-CM | POA: Diagnosis not present

## 2016-11-28 DIAGNOSIS — Z0000 Encounter for general adult medical examination without abnormal findings: Secondary | ICD-10-CM | POA: Diagnosis not present

## 2016-11-28 DIAGNOSIS — R7309 Other abnormal glucose: Secondary | ICD-10-CM | POA: Diagnosis not present

## 2016-11-28 DIAGNOSIS — I10 Essential (primary) hypertension: Secondary | ICD-10-CM | POA: Diagnosis not present

## 2016-11-30 ENCOUNTER — Telehealth: Payer: Self-pay | Admitting: Adult Health

## 2016-11-30 DIAGNOSIS — R740 Nonspecific elevation of levels of transaminase and lactic acid dehydrogenase [LDH]: Principal | ICD-10-CM

## 2016-11-30 DIAGNOSIS — R7401 Elevation of levels of liver transaminase levels: Secondary | ICD-10-CM

## 2016-11-30 NOTE — Telephone Encounter (Signed)
-----   Message from Ward Givens, NP sent at 10/24/2016  2:12 PM EST ----- ast and alt

## 2016-11-30 NOTE — Telephone Encounter (Signed)
Please have patient come in for repeat blood work to check liver function. Orders have been placed

## 2016-12-01 NOTE — Telephone Encounter (Signed)
Spoke to pt and relayed about repeat LFT's.  She stated that she had repeart via Dr. Arelia Sneddon (pcp) whom she saw yesterday.  Results came back still elevated LFT.  Dr. Arelia Sneddon to fax [place] copy.

## 2016-12-02 NOTE — Telephone Encounter (Signed)
Called Dr. Alanson Puls office.  Closed today.  [phone].

## 2016-12-06 NOTE — Telephone Encounter (Signed)
AST improved to 41 and ALT was 69. Both of these were slightly improved. We will check again in one month. Please call patient

## 2016-12-06 NOTE — Telephone Encounter (Signed)
Received lab results.  Placed in MM/NP in box.

## 2016-12-06 NOTE — Telephone Encounter (Signed)
I called pt. I advised her that both her AST and ALT were slightly improved but that Jinny Blossom, NP wants to recheck the labs again in 1 month. Pt is agreeable to this. I see that the order has already been placed so I asked pt to return during business hours at the end of April to [facility] to do the repeat labs. Pt verbalized understanding of results. Pt had no questions at this time but was encouraged to call back if questions arise.

## 2016-12-06 NOTE — Telephone Encounter (Signed)
Spoke to Dr. Tretha Sciara office asking or lab results on pt.  I gave her fax # and she will fax to [place].

## 2016-12-29 ENCOUNTER — Other Ambulatory Visit (INDEPENDENT_AMBULATORY_CARE_PROVIDER_SITE_OTHER): Payer: Self-pay

## 2016-12-29 DIAGNOSIS — Z0289 Encounter for other administrative examinations: Secondary | ICD-10-CM

## 2016-12-29 DIAGNOSIS — R740 Nonspecific elevation of levels of transaminase and lactic acid dehydrogenase [LDH]: Principal | ICD-10-CM

## 2016-12-29 DIAGNOSIS — R7401 Elevation of levels of liver transaminase levels: Secondary | ICD-10-CM

## 2016-12-30 LAB — HEPATIC FUNCTION PANEL
ALBUMIN: 4.6 g/dL (ref 3.6–4.8)
ALK PHOS: 60 IU/L (ref 39–117)
ALT: 39 IU/L — AB (ref 0–32)
AST: 33 IU/L (ref 0–40)
Bilirubin Total: 0.5 mg/dL (ref 0.0–1.2)
Bilirubin, Direct: 0.11 mg/dL (ref 0.00–0.40)
Total Protein: 7 g/dL (ref 6.0–8.5)

## 2017-01-02 ENCOUNTER — Telehealth: Payer: Self-pay

## 2017-01-02 NOTE — Telephone Encounter (Signed)
I called Katherine Yu. I advised her that her AST is normal and that her ALT has improved and that we will continue to monitor. Katherine Yu wants to know when she will need to return for repeat labs since we will be continuing to monitor.   I spoke to [place], NP and she said that it was fine to wait until Katherine Yu's next appt in February of 2019.   I called Katherine Yu again, and advised her that Jinny Blossom, NP said it was ok to wait until February of 2019 to recheck her labs. Katherine Yu states "Well that is confusing. If this medication is damaging my liver, and if I don't have my labs checked until February then it will be missed, but nobody cares, whatever." Katherine Yu then hung up on me before I could discuss further with her.

## 2017-01-02 NOTE — Telephone Encounter (Signed)
I called the patient. Advised that per the RN- she was upset and confused that that we were waiting until February to recheck lab work.  I proceeded to try to explain and answer any questions however the patient   stated "this is fine" and hung up on me.

## 2017-01-02 NOTE — Telephone Encounter (Signed)
-----   Message from Ward Givens, NP sent at 01/02/2017  8:07 AM EDT ----- AST is normal. ALT has improved. We will continue to monitor. Please call patient with results

## 2017-02-23 ENCOUNTER — Telehealth (HOSPITAL_BASED_OUTPATIENT_CLINIC_OR_DEPARTMENT_OTHER): Payer: 59 | Admitting: Pharmacist

## 2017-02-23 ENCOUNTER — Encounter: Payer: Self-pay | Admitting: Pharmacist

## 2017-02-23 DIAGNOSIS — G35 Multiple sclerosis: Secondary | ICD-10-CM

## 2017-02-23 NOTE — Progress Notes (Signed)
S: Patient contacted today for follow up with the [facility].  Patient is currently taking Rebif for MS. Patient is managed by Dr. Jannifer Franklin for this. She reports that she is doing well with Rebif and has had no new issues or lesions in the last year.  She reports that she feels pressure from her neurologist to stop the Rebif and to switch to something else.  Adherence: denies any missed doses  Dosing: Rebif (subQ):44 mcg 3 times weekly (separate doses by at least 48 hours)  Dose adjustments: Hepatic: none needed  Drug-drug interactions: none  Monitoring: CBC: WNL LFTs: slightly elevated ALT but last trend was down from previous readings Thyroid function tests: reports it is from outside lab and WNL Injection site reactions: reports but tolerates them S/sx of malignancy: denies Neuropsychiatric symptoms: denies Thrombotic microangiopathy (monitor for new onset HTN, thrombocytopenia, or impaired renal dysfunction): all labs and values WNL    O:     Lab Results  Component Value Date   WBC 4.9 10/20/2016   HGB 14.6 10/20/2016   HCT 41.7 10/20/2016   MCV 90 10/20/2016   PLT 160 10/20/2016      Chemistry      Component Value Date/Time   NA 143 10/20/2016 0854   K 5.3 (H) 10/20/2016 0854   CL 101 10/20/2016 0854   CO2 18 10/20/2016 0854   BUN 19 10/20/2016 0854   CREATININE 1.01 (H) 10/20/2016 0854      Component Value Date/Time   CALCIUM 9.9 10/20/2016 0854   ALKPHOS 60 12/29/2016 1004   AST 33 12/29/2016 1004   ALT 39 (H) 12/29/2016 1004   BILITOT 0.5 12/29/2016 1004       A/P: 1. Medication review: Patient on Rebif for MS and is tolerating the medication well. Last LFTs were slightly elevated but per neurology, ok to follow up Feb 2019. Provided education on medication and that if it is working, she doesn't have to change her medication but that there are newer drugs on the market that are options if she ever  would want to switch. However, that is a discussion she needs to continue to have with her neurologist. . No recommendations for any changes. Patient to follow up with Dr. Jannifer Franklin as directed.    Christella Hartigan, PharmD, BCPS, BCACP, [facility] [phone]

## 2017-06-23 DIAGNOSIS — Z23 Encounter for immunization: Secondary | ICD-10-CM | POA: Diagnosis not present

## 2017-07-25 DIAGNOSIS — H524 Presbyopia: Secondary | ICD-10-CM | POA: Diagnosis not present

## 2017-07-25 DIAGNOSIS — H43393 Other vitreous opacities, bilateral: Secondary | ICD-10-CM | POA: Diagnosis not present

## 2017-07-25 DIAGNOSIS — H2513 Age-related nuclear cataract, bilateral: Secondary | ICD-10-CM | POA: Diagnosis not present

## 2017-07-25 DIAGNOSIS — H40013 Open angle with borderline findings, low risk, bilateral: Secondary | ICD-10-CM | POA: Diagnosis not present

## 2017-07-25 DIAGNOSIS — H43813 Vitreous degeneration, bilateral: Secondary | ICD-10-CM | POA: Diagnosis not present

## 2017-07-31 ENCOUNTER — Other Ambulatory Visit: Payer: Self-pay | Admitting: Internal Medicine

## 2017-08-01 ENCOUNTER — Other Ambulatory Visit: Payer: Self-pay | Admitting: *Deleted

## 2017-08-01 MED ORDER — INTERFERON BETA-1A 44 MCG/0.5ML ~~LOC~~ SOSY: 44.0000 ug | PREFILLED_SYRINGE | SUBCUTANEOUS | 9 refills | Status: DC

## 2017-08-01 NOTE — Telephone Encounter (Signed)
Rebif escribed per faxed request. Patient has FU with Dr Jannifer Franklin.

## 2017-10-23 ENCOUNTER — Telehealth: Payer: Self-pay | Admitting: *Deleted

## 2017-10-23 ENCOUNTER — Ambulatory Visit: Payer: 59 | Admitting: Neurology

## 2017-10-23 ENCOUNTER — Telehealth: Payer: Self-pay | Admitting: Neurology

## 2017-10-23 ENCOUNTER — Encounter: Payer: Self-pay | Admitting: Neurology

## 2017-10-23 VITALS — BP 136/83 | HR 81 | Wt 174.0 lb

## 2017-10-23 DIAGNOSIS — G43009 Migraine without aura, not intractable, without status migrainosus: Secondary | ICD-10-CM

## 2017-10-23 DIAGNOSIS — Z5181 Encounter for therapeutic drug level monitoring: Secondary | ICD-10-CM

## 2017-10-23 DIAGNOSIS — G35 Multiple sclerosis: Secondary | ICD-10-CM | POA: Diagnosis not present

## 2017-10-23 MED ORDER — ALPRAZOLAM 0.5 MG PO TABS: ORAL_TABLET | ORAL | 0 refills | Status: DC

## 2017-10-23 NOTE — Telephone Encounter (Signed)
Initiated PA Rebif 46mcg/0.5ml on covermymeds. Key: [id number]. In process of completing.

## 2017-10-23 NOTE — Patient Instructions (Signed)
  We will check MRI of the brain and cervical spine. 

## 2017-10-23 NOTE — Progress Notes (Signed)
Reason for visit: Multiple sclerosis  Katherine Yu is an 67 y.o. female  History of present illness:  Katherine Yu is a 67 year old right-handed white female with a history of multiple sclerosis.  The patient was last seen 1 year ago, she has done well over the last year.  She has not noted any new numbness, weakness, balance changes, or any bowel or bladder control problems.  She does report some mild memory problems with word finding issues, she does not believe that there has been any significant change over the last year.  She does get migraine headaches, the headaches may occur only once a month but may last several days.  He may have to take several Relpax to get through the headache.  The patient indicates that stress and odors, lack of sleep may be activators.  The patient comes to this office for an evaluation.  She is on Requip, she tolerates the medication relatively well.  Past Medical History:  Diagnosis Date  . Chronic insomnia   . Degenerative arthritis   . Dyslipidemia   . History of lower leg fracture   . History of peptic ulcer   . History of seizures    In childhood  . History of skull fracture   . Hypertension   . Migraine   . Mild obesity   . MS (multiple sclerosis) (Katherine Yu)   . Plantar fasciitis   . Rosacea   . Temporomandibular joint disease   . Tubal pregnancy     Past Surgical History:  Procedure Laterality Date  . APPENDECTOMY    . CARPAL TUNNEL RELEASE Bilateral   . GALLBLADDER SURGERY    . metal plate resection     skull  . pilonidal cyst      Family History  Adopted: Yes  Problem Relation Age of Onset  . Lupus Sister   . Cancer Sister   . Heart attack Sister   . Heart attack Sister     Social history:  reports that  has never smoked. she has never used smokeless tobacco. She reports that she does not drink alcohol or use drugs.    Allergies  Allergen Reactions  . Niacin Anaphylaxis, Hives and Itching    Medications:  Prior to  Admission medications   Medication Sig Start Date End Date Taking? Authorizing Provider  acetaminophen (TYLENOL) 650 MG CR tablet Take 650 mg by mouth every 8 (eight) hours as needed for pain.   Yes [provider]  aspirin 81 MG tablet Take 81 mg by mouth daily.   Yes [provider]  benazepril (LOTENSIN) 40 MG tablet Take 40 mg by mouth daily.  08/04/15  Yes [provider]  Cholecalciferol (VITAMIN D3) 5000 UNITS TABS Take 5,000 Units by mouth daily.   Yes [provider]  eletriptan (RELPAX) 40 MG tablet Take 1 tablet (40 mg total) by mouth as needed for migraine. May repeat in 2 hours if headache persists or recurs. 10/20/16  Yes Ward Givens, NP  famotidine (PEPCID) 20 MG tablet Take 20 mg by mouth daily.   Yes [provider]  hydrochlorothiazide (HYDRODIURIL) 50 MG tablet Take 25 mg by mouth daily.  09/22/15  Yes [provider]  hydrOXYzine (VISTARIL) 25 MG capsule TAKE 1 CAPSULE BY MOUTH EVERY 6 HOURS AS NEEDED. 10/20/16  Yes Ward Givens, NP  ibuprofen (ADVIL,MOTRIN) 200 MG tablet Take 200 mg by mouth as needed.   Yes [provider]  interferon beta-1a (REBIF) 44  MCG/0.5ML SOSY injection Inject 0.5 mLs (44 mcg total) into the skin 3 (three) times a week. 08/02/17  Yes Kathrynn Ducking, MD  Misc Natural Products (MIDNITE PO) Take by mouth at bedtime.   Yes [provider]  Polyethyl Glycol-Propyl Glycol 0.4-0.3 % GEL Apply to eye at bedtime.   Yes [provider]  ALPRAZolam Duanne Moron) 0.5 MG tablet Take 2 tablets approximately 45 minutes prior to the MRI study, take a third tablet if needed. 10/23/17   Kathrynn Ducking, MD    ROS:  Out of a complete 14 system review of symptoms, the patient complains only of the following symptoms, and all other reviewed systems are negative.  Incontinence of the bowels Insomnia Incontinence of bladder, frequency of urination Joint pain, joint swelling, back pain,  aching muscles, muscle cramps, walking difficulty, neck pain Memory loss, dizziness, headache  Blood pressure 136/83, pulse 81, weight 174 lb (78.9 kg).  Physical Exam  General: The patient is alert and cooperative at the time of the examination.  Skin: No significant peripheral edema is noted.   Neurologic Exam  Mental status: The patient is alert and oriented x 3 at the time of the examination. The patient has apparent normal recent and remote memory, with an apparently normal attention span and concentration ability.   Cranial nerves: Facial symmetry is present. Speech is normal, no aphasia or dysarthria is noted. Extraocular movements are full. Visual fields are full.  Pupils are equal, round, and reactive to light.  Discs are flat bilaterally.  Motor: The patient has good strength in all 4 extremities.  Sensory examination: Soft touch sensation is symmetric on the face, arms, and legs.  Coordination: The patient has good finger-nose-finger and heel-to-shin bilaterally.  Gait and station: The patient has a normal gait. Tandem gait is slightly unsteady. Romberg is negative, but is unsteady. No drift is seen.  Reflexes: Deep tendon reflexes are symmetric.   Assessment/Plan:  1.  Multiple sclerosis  2.  Migraine headaches  The patient is doing fairly well on Rebif.  The patient will continue this medication, we will check blood work today.  It has been 3 years since the last MRI scan procedure, we will do MRI of the brain and cervical spine.  The migraine headaches are few but may last several days in duration.  I have recommended potentially switching from Relpax to a long-acting triptan medication such as Amerge or Frova.  The patient wants to "think about".  The patient will follow up in 1 year.  [name] MD 10/23/2017 7:55 AM  [facility] [address] [place], [place] [address]  Phone [phone] Fax [phone]

## 2017-10-23 NOTE — Telephone Encounter (Signed)
I left a detail voicemail on her home and mobile about her MRI appt at [facility] for Saturday 10/28/17 arrival time is 11:30 AM. If for any reason she needed to reschedule I left their phone number of [phone].

## 2017-10-24 LAB — COMPREHENSIVE METABOLIC PANEL
A/G RATIO: 1.6 (ref 1.2–2.2)
ALBUMIN: 4.4 g/dL (ref 3.6–4.8)
ALK PHOS: 70 IU/L (ref 39–117)
ALT: 46 IU/L — ABNORMAL HIGH (ref 0–32)
AST: 28 IU/L (ref 0–40)
BILIRUBIN TOTAL: 0.2 mg/dL (ref 0.0–1.2)
BUN / CREAT RATIO: 21 (ref 12–28)
BUN: 22 mg/dL (ref 8–27)
CHLORIDE: 100 mmol/L (ref 96–106)
CO2: 23 mmol/L (ref 20–29)
Calcium: 9.9 mg/dL (ref 8.7–10.3)
Creatinine, Ser: 1.06 mg/dL — ABNORMAL HIGH (ref 0.57–1.00)
GFR calc Af Amer: 64 mL/min/{1.73_m2} (ref >59)
GFR calc non Af Amer: 56 mL/min/{1.73_m2} — ABNORMAL LOW (ref >59)
GLOBULIN, TOTAL: 2.7 g/dL (ref 1.5–4.5)
Glucose: 138 mg/dL — ABNORMAL HIGH (ref 65–99)
POTASSIUM: 4.1 mmol/L (ref 3.5–5.2)
SODIUM: 140 mmol/L (ref 134–144)
Total Protein: 7.1 g/dL (ref 6.0–8.5)

## 2017-10-24 LAB — CBC WITH DIFFERENTIAL/PLATELET
BASOS ABS: 0 10*3/uL (ref 0.0–0.2)
Basos: 1 %
EOS (ABSOLUTE): 0.2 10*3/uL (ref 0.0–0.4)
EOS: 4 %
HEMATOCRIT: 42.3 % (ref 34.0–46.6)
Hemoglobin: 14 g/dL (ref 11.1–15.9)
Immature Grans (Abs): 0 10*3/uL (ref 0.0–0.1)
Immature Granulocytes: 0 %
LYMPHS ABS: 1.5 10*3/uL (ref 0.7–3.1)
Lymphs: 35 %
MCH: 30 pg (ref 26.6–33.0)
MCHC: 33.1 g/dL (ref 31.5–35.7)
MCV: 91 fL (ref 79–97)
MONOS ABS: 0.4 10*3/uL (ref 0.1–0.9)
Monocytes: 9 %
Neutrophils Absolute: 2.3 10*3/uL (ref 1.4–7.0)
Neutrophils: 51 %
Platelets: 192 10*3/uL (ref 150–379)
RBC: 4.67 x10E6/uL (ref 3.77–5.28)
RDW: 14 % (ref 12.3–15.4)
WBC: 4.4 10*3/uL (ref 3.4–10.8)

## 2017-10-24 NOTE — Telephone Encounter (Signed)
Submitted PA. Waiting on determination. °

## 2017-10-25 ENCOUNTER — Other Ambulatory Visit: Payer: Self-pay | Admitting: Pharmacist

## 2017-10-25 MED ORDER — INTERFERON BETA-1A 44 MCG/0.5ML ~~LOC~~ SOSY: 44.0000 ug | PREFILLED_SYRINGE | SUBCUTANEOUS | 6 refills | Status: DC

## 2017-10-25 NOTE — Telephone Encounter (Signed)
Received fax notification from [employer] that PA approved effective 10/23/17-10/22/18. 12 syringes per 28 days.

## 2017-10-28 ENCOUNTER — Ambulatory Visit (HOSPITAL_COMMUNITY)
Admission: RE | Admit: 2017-10-28 | Discharge: 2017-10-28 | Disposition: A | Discharge status: Home / Self Care | Payer: 59 | Source: Ambulatory Visit | Attending: Neurology | Admitting: Neurology

## 2017-10-28 DIAGNOSIS — G35 Multiple sclerosis: Secondary | ICD-10-CM

## 2017-10-28 DIAGNOSIS — M47812 Spondylosis without myelopathy or radiculopathy, cervical region: Secondary | ICD-10-CM | POA: Insufficient documentation

## 2017-10-28 MED ORDER — GADOBENATE DIMEGLUMINE 529 MG/ML IV SOLN
20.0000 mL | Freq: Once | INTRAVENOUS | Status: AC | PRN
  Administered 2017-10-28: 17 mL via INTRAVENOUS

## 2017-10-29 ENCOUNTER — Telehealth: Payer: Self-pay | Admitting: Neurology

## 2017-10-29 NOTE — Telephone Encounter (Signed)
I called the patient.  MRI of the brain shows good stability with MS lesions from 3 years ago.  No disease within the spinal cord is seen.  The patient seems to be doing well on the Rebif, she is to continue this medication.     MRI brain and cervical 10/29/17:  IMPRESSION: 1. Stable diffuse white matter disease with extensive involvement of the corpus callosum and subcortical regions compatible with the given diagnosis of multiple sclerosis. There is no evidence for disease progression. No restricted diffusion or enhancement associated with the lesions, signs of acute or progressive demyelination. 2. No acute intracranial abnormality. 3. No focal cord signal abnormality to suggest demyelinating disease within the cervical spinal cord. 4. 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

## 2017-11-29 ENCOUNTER — Other Ambulatory Visit: Payer: Self-pay | Admitting: Adult Health

## 2017-12-28 DIAGNOSIS — Z0000 Encounter for general adult medical examination without abnormal findings: Secondary | ICD-10-CM | POA: Diagnosis not present

## 2017-12-28 DIAGNOSIS — R7301 Impaired fasting glucose: Secondary | ICD-10-CM | POA: Diagnosis not present

## 2017-12-29 DIAGNOSIS — I10 Essential (primary) hypertension: Secondary | ICD-10-CM | POA: Diagnosis not present

## 2017-12-29 DIAGNOSIS — R7301 Impaired fasting glucose: Secondary | ICD-10-CM | POA: Diagnosis not present

## 2017-12-29 DIAGNOSIS — Z0000 Encounter for general adult medical examination without abnormal findings: Secondary | ICD-10-CM | POA: Diagnosis not present

## 2018-02-24 ENCOUNTER — Encounter (HOSPITAL_COMMUNITY): Payer: Self-pay

## 2018-02-24 ENCOUNTER — Ambulatory Visit (HOSPITAL_COMMUNITY)
Admission: EM | Admit: 2018-02-24 | Discharge: 2018-02-24 | Disposition: A | Discharge status: Home / Self Care | Payer: 59 | Attending: Internal Medicine | Admitting: Internal Medicine

## 2018-02-24 DIAGNOSIS — R21 Rash and other nonspecific skin eruption: Secondary | ICD-10-CM | POA: Diagnosis not present

## 2018-02-24 DIAGNOSIS — L237 Allergic contact dermatitis due to plants, except food: Secondary | ICD-10-CM

## 2018-02-24 MED ORDER — METHYLPREDNISOLONE SODIUM SUCC 125 MG IJ SOLR
INTRAMUSCULAR | Status: AC
Start: 2018-02-24 — End: 2018-02-24
  Filled 2018-02-24: qty 2

## 2018-02-24 MED ORDER — METHYLPREDNISOLONE SODIUM SUCC 125 MG IJ SOLR
125.0000 mg | Freq: Once | INTRAMUSCULAR | Status: AC
  Administered 2018-02-24: 125 mg via INTRAMUSCULAR

## 2018-02-24 MED ORDER — PREDNISONE 10 MG (21) PO TBPK: ORAL_TABLET | ORAL | 0 refills | Status: DC

## 2018-02-24 NOTE — ED Triage Notes (Signed)
Pt presents with complaints of poison ivy on Thursday.  Reports having it spread to her eye and perineum that scared her and brought her in.

## 2018-02-24 NOTE — ED Provider Notes (Signed)
[facility]   [id number] 02/24/18 Arrival Time: 1275  SUBJECTIVE:  Katherine Yu is a 67 y.o. female hx significant for MS on interferon who presents with a skin complaint that began on Thursday.  Admits to positive poison ivy exposure. Localizes the rash to left eye and perineum.  Describes it as painful, red, spreading and itchy.  Has tried benadryl topical cream and vistral without relief.  Denies worsening factors.  Reports similar symptoms in the past that improved with steroids.   Complains of chills.  Denies fever, nausea, vomiting, erythema, swollen glands, tongue swelling, lip swelling, throat swelling, SOB, chest pain, abdominal pain, changes in bowel or bladder function.    ROS: As per HPI.  Past Medical History:  Diagnosis Date  . Chronic insomnia   . Degenerative arthritis   . Dyslipidemia   . History of lower leg fracture   . History of peptic ulcer   . History of seizures    In childhood  . History of skull fracture   . Hypertension   . Migraine   . Mild obesity   . MS (multiple sclerosis) ([place])   . Plantar fasciitis   . Rosacea   . Temporomandibular joint disease   . Tubal pregnancy    Past Surgical History:  Procedure Laterality Date  . APPENDECTOMY    . CARPAL TUNNEL RELEASE Bilateral   . GALLBLADDER SURGERY    . metal plate resection     skull  . pilonidal cyst     Allergies  Allergen Reactions  . Niacin Anaphylaxis, Hives and Itching   No current facility-administered medications on file prior to encounter.    Current Outpatient Medications on File Prior to Encounter  Medication Sig Dispense Refill  . acetaminophen (TYLENOL) 650 MG CR tablet Take 650 mg by mouth every 8 (eight) hours as needed for pain.    Marland Kitchen ALPRAZolam (XANAX) 0.5 MG tablet Take 2 tablets approximately 45 minutes prior to the MRI study, take a third tablet if needed. 3 tablet 0  . aspirin 81 MG tablet Take 81 mg by mouth daily.    . benazepril (LOTENSIN) 40 MG tablet  Take 40 mg by mouth daily.   4  . Cholecalciferol (VITAMIN D3) 5000 UNITS TABS Take 5,000 Units by mouth daily.    Marland Kitchen eletriptan (RELPAX) 40 MG tablet TAKE 1 TABLET (40 MG TOTAL) BY MOUTH AS NEEDED FOR MIGRAINE. MAY REPEAT IN 2 HOURS IF HEADACHE PERSISTS OR RECURS. 24 tablet 5  . famotidine (PEPCID) 20 MG tablet Take 20 mg by mouth daily.    . hydrochlorothiazide (HYDRODIURIL) 50 MG tablet Take 25 mg by mouth daily.   0  . hydrOXYzine (VISTARIL) 25 MG capsule TAKE 1 CAPSULE BY MOUTH EVERY 6 HOURS AS NEEDED. 30 capsule 5  . ibuprofen (ADVIL,MOTRIN) 200 MG tablet Take 200 mg by mouth as needed.    . interferon beta-1a (REBIF) 44 MCG/0.5ML SOSY injection Inject 0.5 mLs (44 mcg total) into the skin 3 (three) times a week. 12 Syringe 6  . Misc Natural Products (MIDNITE PO) Take by mouth at bedtime.    Vladimir Faster Glycol-Propyl Glycol 0.4-0.3 % GEL Apply to eye at bedtime.     Social History   Socioeconomic History  . Marital status: Married    Spouse name: Not on file  . Number of children: 3  . Years of education: Masters  . Highest education level: Not on file  Occupational History  . Occupation: Equities trader  Social Needs  . Financial resource strain: Not on file  . Food insecurity:    Worry: Not on file    Inability: Not on file  . Transportation needs:    Medical: Not on file    Non-medical: Not on file  Tobacco Use  . Smoking status: Never Smoker  . Smokeless tobacco: Never Used  Substance and Sexual Activity  . Alcohol use: No  . Drug use: No  . Sexual activity: Not on file  Lifestyle  . Physical activity:    Days per week: Not on file    Minutes per session: Not on file  . Stress: Not on file  Relationships  . Social connections:    Talks on phone: Not on file    Gets together: Not on file    Attends religious service: Not on file    Active member of club or organization: Not on file    Attends meetings of clubs or organizations: Not on file    Relationship  status: Not on file  . Intimate partner violence:    Fear of current or ex partner: Not on file    Emotionally abused: Not on file    Physically abused: Not on file    Forced sexual activity: Not on file  Other Topics Concern  . Not on file  Social History Narrative   Patient is right handed.   Patient drinks 1 cup caffeine daily.   Family History  Adopted: Yes  Problem Relation Age of Onset  . Lupus Sister   . Cancer Sister   . Heart attack Sister   . Heart attack Sister     OBJECTIVE: Vitals:   02/24/18 1453  BP: (!) 148/90  Pulse: 83  Resp: 16  Temp: 98.8 F (37.1 C)  TempSrc: Oral  SpO2: 95%    General appearance: alert; no distress; talking in full sentences; tolerating own secretions Eyes: PERRL; EOMI grossly Ears: EACs clear, TM pearly gray with visible cone of light Mouth: Oropharynx clearn; uvula midline; no obvious swelling  Lungs: clear to auscultation bilaterally Heart: regular rate and rhythm.  Radial pulse 2+ bilaterally Extremities: no edema Skin: warm and dry; multiple erythematous rash diffuse about the body including left eye, right ear, and right side abdominal involvement; declines GU examination Psychological: alert and cooperative; normal mood and affect          ASSESSMENT & PLAN:  1. Allergic contact dermatitis due to plants, except food     Meds ordered this encounter  Medications  . predniSONE (STERAPRED UNI-PAK 21 TAB) 10 MG (21) TBPK tablet    Sig: Take 6 tabs by mouth daily  for 2 days, then 5 tabs for 2 days, then 4 tabs for 2 days, then 3 tabs for 2 days, 2 tabs for 2 days, then 1 tab by mouth daily for 2 days    Dispense:  42 tablet    Refill:  0    Order Specific Question:   Supervising Provider    Answer:   Wynona Luna [phone]  . methylPREDNISolone sodium succinate (SOLU-MEDROL) 125 mg/2 mL injection 125 mg   Methylprednisolone injection given in office Prescribed prednisone dose pak.  Take as directed and  to completion Continue vistaril OR benadryl as needed for symptomatic relief of itching Follow up with PCP next week if symptoms persists Return or go to the ED if you have any new or worsening symptoms  Reviewed expectations re: course of current medical issues. Questions  answered. Outlined signs and symptoms indicating need for more acute intervention. Patient verbalized understanding. After Visit Summary given.   Lestine Box, PA-C 02/24/18 1528

## 2018-02-24 NOTE — Discharge Instructions (Addendum)
Methylprednisolone injection given in office Prescribed prednisone dose pak.  Take as directed and to completion Continue vistaril OR benadryl as needed for symptomatic relief of itching Follow up with PCP next week if symptoms persists Return or go to the ED if you have any new or worsening symptoms

## 2018-02-28 ENCOUNTER — Other Ambulatory Visit: Payer: Self-pay | Admitting: Family Medicine

## 2018-02-28 DIAGNOSIS — Z1231 Encounter for screening mammogram for malignant neoplasm of breast: Secondary | ICD-10-CM

## 2018-03-21 ENCOUNTER — Ambulatory Visit
Admission: RE | Admit: 2018-03-21 | Discharge: 2018-03-21 | Disposition: A | Discharge status: Home / Self Care | Payer: 59 | Source: Ambulatory Visit | Attending: Family Medicine | Admitting: Family Medicine

## 2018-03-21 DIAGNOSIS — Z1231 Encounter for screening mammogram for malignant neoplasm of breast: Secondary | ICD-10-CM

## 2018-04-17 ENCOUNTER — Other Ambulatory Visit: Payer: Self-pay | Admitting: Obstetrics and Gynecology

## 2018-04-17 ENCOUNTER — Telehealth: Payer: Self-pay | Admitting: Neurology

## 2018-04-17 ENCOUNTER — Other Ambulatory Visit (HOSPITAL_COMMUNITY)
Admission: RE | Admit: 2018-04-17 | Discharge: 2018-04-17 | Disposition: A | Discharge status: Home / Self Care | Payer: 59 | Source: Ambulatory Visit | Attending: Obstetrics and Gynecology | Admitting: Obstetrics and Gynecology

## 2018-04-17 DIAGNOSIS — Z01411 Encounter for gynecological examination (general) (routine) with abnormal findings: Secondary | ICD-10-CM | POA: Insufficient documentation

## 2018-04-17 DIAGNOSIS — N816 Rectocele: Secondary | ICD-10-CM | POA: Diagnosis not present

## 2018-04-17 DIAGNOSIS — N814 Uterovaginal prolapse, unspecified: Secondary | ICD-10-CM | POA: Diagnosis not present

## 2018-04-17 NOTE — Telephone Encounter (Signed)
Pt is having difficulty getting out of bed for the past 3 weeks.  She is having left upper muscle pain and balance issues. Please call to discuss

## 2018-04-17 NOTE — Telephone Encounter (Signed)
I called and spoke with patient and she is available to come in tomorrow at 12p to see Dr. Jannifer Franklin.

## 2018-04-17 NOTE — Telephone Encounter (Signed)
I called the patient.  Beginning on 26 March 2018, she began having onset of back pain and leg discomfort, she does not recall which leg.  Over the last week she has been left with significant pain in the left groin area, she has difficulty flexing at the hip because of this.  She is not sure that there is any weakness per se.  She has not noted any change in bowel or bladder function.  She is not sure whether this is related to multiple sclerosis or some other process.  I will try to get her into the office for an evaluation.

## 2018-04-18 ENCOUNTER — Ambulatory Visit
Admission: RE | Admit: 2018-04-18 | Discharge: 2018-04-18 | Disposition: A | Discharge status: Home / Self Care | Payer: 59 | Source: Ambulatory Visit | Attending: Neurology | Admitting: Neurology

## 2018-04-18 ENCOUNTER — Encounter: Payer: Self-pay | Admitting: Neurology

## 2018-04-18 ENCOUNTER — Ambulatory Visit: Payer: 59 | Admitting: Neurology

## 2018-04-18 VITALS — BP 129/79 | HR 79 | Wt 171.0 lb

## 2018-04-18 DIAGNOSIS — M25552 Pain in left hip: Secondary | ICD-10-CM

## 2018-04-18 DIAGNOSIS — R300 Dysuria: Secondary | ICD-10-CM | POA: Diagnosis not present

## 2018-04-18 DIAGNOSIS — G35 Multiple sclerosis: Secondary | ICD-10-CM

## 2018-04-18 DIAGNOSIS — Z5181 Encounter for therapeutic drug level monitoring: Secondary | ICD-10-CM

## 2018-04-18 DIAGNOSIS — G43009 Migraine without aura, not intractable, without status migrainosus: Secondary | ICD-10-CM

## 2018-04-18 DIAGNOSIS — M160 Bilateral primary osteoarthritis of hip: Secondary | ICD-10-CM | POA: Diagnosis not present

## 2018-04-18 DIAGNOSIS — G35D Multiple sclerosis, unspecified: Secondary | ICD-10-CM

## 2018-04-18 NOTE — Progress Notes (Signed)
Reason for visit: Left leg discomfort  Katherine Yu is an 67 y.o. female  History of present illness:  Katherine Yu is a 67 year old right-handed white female with history of multiple sclerosis treated with Rebif.  The patient does have some chronic gait problems and some difficulty controlling the bowels and the bladder.  The patient comes in today with a new problem.  She apparently was on prednisone following a Solu-Medrol injection for a poison ivy exposure.  Around the time that she came off of prednisone 26 March 2018 she developed some discomfort in the back and in the legs.  Over the last week she has had pain particularly in the left groin area that seems to be worse when she is weightbearing, better when she is sitting or lying down.  The patient does not note any true weakness of the lower extremities, she denies any new numbness of the legs and any new problems with numbness or weakness of the arms.  She does have some chronic neck pain that has not changed.  She fell 4 weeks ago, she fell forward going up some steps, she did not injure her back or her leg at that time.  The patient has not noted any symptoms of visual changes, difficulty with swallowing or choking.  She comes into the office today for evaluation of this new problem.  Past Medical History:  Diagnosis Date  . Chronic insomnia   . Degenerative arthritis   . Dyslipidemia   . History of lower leg fracture   . History of peptic ulcer   . History of seizures    In childhood  . History of skull fracture   . Hypertension   . Migraine   . Mild obesity   . MS (multiple sclerosis) ([place])   . Plantar fasciitis   . Rosacea   . Temporomandibular joint disease   . Tubal pregnancy     Past Surgical History:  Procedure Laterality Date  . APPENDECTOMY    . CARPAL TUNNEL RELEASE Bilateral   . GALLBLADDER SURGERY    . metal plate resection     skull  . pilonidal cyst      Family History  Adopted: Yes  Problem  Relation Age of Onset  . Lupus Sister   . Cancer Sister   . Heart attack Sister   . Heart attack Sister     Social history:  reports that she has never smoked. She has never used smokeless tobacco. She reports that she does not drink alcohol or use drugs.    Allergies  Allergen Reactions  . Niacin Anaphylaxis, Hives and Itching    Medications:  Prior to Admission medications   Medication Sig Start Date End Date Taking? Authorizing Provider  acetaminophen (TYLENOL) 650 MG CR tablet Take 650 mg by mouth every 8 (eight) hours as needed for pain.   Yes [provider]  ALPRAZolam Duanne Moron) 0.5 MG tablet Take 2 tablets approximately 45 minutes prior to the MRI study, take a third tablet if needed. 10/23/17  Yes Kathrynn Ducking, MD  benazepril (LOTENSIN) 40 MG tablet Take 40 mg by mouth daily.  08/04/15  Yes [provider]  Cholecalciferol (VITAMIN D3) 5000 UNITS TABS Take 5,000 Units by mouth daily.   Yes [provider]  eletriptan (RELPAX) 40 MG tablet TAKE 1 TABLET (40 MG TOTAL) BY MOUTH AS NEEDED FOR MIGRAINE. MAY REPEAT IN 2 HOURS IF HEADACHE PERSISTS OR RECURS. 11/29/17  Yes Margette Fast  K, MD  famotidine (PEPCID) 20 MG tablet Take 20 mg by mouth daily.   Yes [provider]  hydrochlorothiazide (HYDRODIURIL) 50 MG tablet Take 25 mg by mouth daily.  09/22/15  Yes [provider]  hydrOXYzine (VISTARIL) 25 MG capsule TAKE 1 CAPSULE BY MOUTH EVERY 6 HOURS AS NEEDED. 11/29/17  Yes Kathrynn Ducking, MD  ibuprofen (ADVIL,MOTRIN) 200 MG tablet Take 200 mg by mouth as needed.   Yes [provider]  interferon beta-1a (REBIF) 44 MCG/0.5ML SOSY injection Inject 0.5 mLs (44 mcg total) into the skin 3 (three) times a week. 10/25/17  Yes Tresa Garter, MD  Misc Natural Products (MIDNITE PO) Take by mouth at bedtime.   Yes [provider]    ROS:  Out of a complete 14 system review of symptoms, the patient complains only of  the following symptoms, and all other reviewed systems are negative.  Leg pain Urinary urgency, incontinence  Blood pressure 129/79, pulse 79, weight 171 lb (77.6 kg).  Physical Exam  General: The patient is alert and cooperative at the time of the examination.  Neuromuscular: External rotation of the left hip produces significant discomfort, no pain was noted with rotation of the right hip.  Skin: No significant peripheral edema is noted.   Neurologic Exam  Mental status: The patient is alert and oriented x 3 at the time of the examination. The patient has apparent normal recent and remote memory, with an apparently normal attention span and concentration ability.   Cranial nerves: Facial symmetry is present. Speech is normal, no aphasia or dysarthria is noted. Extraocular movements are full. Visual fields are full.  Pupils are equal, round, and reactive to light.  Discs are flat bilaterally.  Motor: The patient has good strength in all 4 extremities.  Sensory examination: Soft touch sensation is symmetric on the face, arms, and legs.  Coordination: The patient has good finger-nose-finger and heel-to-shin bilaterally.   Gait and station: The patient has a minimally wide-based gait, the patient uses a cane for ambulation. Tandem gait is unsteady. Romberg is positive. No drift is seen.  Reflexes: Deep tendon reflexes are symmetric.  The ankle jerk reflexes are well-maintained.   Assessment/Plan:  1.  Left upper leg and hip discomfort  2.  Multiple sclerosis  The patient appears to be having some pain that may be associated with the left hip joint.  The patient just finished a course of Solu-Medrol and prednisone.  The patient will undergo an x-ray of the left hip to exclude degenerative arthritis or avascular necrosis.  She will have blood work done today and urinalysis.  The pain appears to be mechanical in nature, worse with weightbearing, I do not believe that this is  related to the multiple sclerosis.  We may consider MRI of the lumbar spine, or a possible orthopedic surgery referral in the future if the left hip pain continues.  [name] MD 04/18/2018 12:30 PM  [facility] [address] [place], [place] [address]  Phone [phone] Fax [phone]

## 2018-04-19 ENCOUNTER — Telehealth: Payer: Self-pay | Admitting: Neurology

## 2018-04-19 DIAGNOSIS — G35 Multiple sclerosis: Secondary | ICD-10-CM

## 2018-04-19 DIAGNOSIS — Z79899 Other long term (current) drug therapy: Secondary | ICD-10-CM

## 2018-04-19 LAB — MICROSCOPIC EXAMINATION

## 2018-04-19 LAB — CBC WITH DIFFERENTIAL/PLATELET
BASOS: 1 %
Basophils Absolute: 0 10*3/uL (ref 0.0–0.2)
EOS (ABSOLUTE): 0.1 10*3/uL (ref 0.0–0.4)
EOS: 3 %
HEMATOCRIT: 41.4 % (ref 34.0–46.6)
Hemoglobin: 13.7 g/dL (ref 11.1–15.9)
Immature Grans (Abs): 0 10*3/uL (ref 0.0–0.1)
Immature Granulocytes: 0 %
LYMPHS ABS: 1.8 10*3/uL (ref 0.7–3.1)
Lymphs: 43 %
MCH: 29.8 pg (ref 26.6–33.0)
MCHC: 33.1 g/dL (ref 31.5–35.7)
MCV: 90 fL (ref 79–97)
MONOS ABS: 0.4 10*3/uL (ref 0.1–0.9)
Monocytes: 10 %
NEUTROS ABS: 1.9 10*3/uL (ref 1.4–7.0)
Neutrophils: 43 %
Platelets: 198 10*3/uL (ref 150–450)
RBC: 4.6 x10E6/uL (ref 3.77–5.28)
RDW: 14.6 % (ref 12.3–15.4)
WBC: 4.3 10*3/uL (ref 3.4–10.8)

## 2018-04-19 LAB — COMPREHENSIVE METABOLIC PANEL
A/G RATIO: 1.8 (ref 1.2–2.2)
ALBUMIN: 4.4 g/dL (ref 3.6–4.8)
ALK PHOS: 66 IU/L (ref 39–117)
ALT: 144 IU/L — ABNORMAL HIGH (ref 0–32)
AST: 101 IU/L — ABNORMAL HIGH (ref 0–40)
BUN / CREAT RATIO: 25 (ref 12–28)
BUN: 21 mg/dL (ref 8–27)
Bilirubin Total: 0.2 mg/dL (ref 0.0–1.2)
CALCIUM: 10.1 mg/dL (ref 8.7–10.3)
CO2: 25 mmol/L (ref 20–29)
CREATININE: 0.85 mg/dL (ref 0.57–1.00)
Chloride: 101 mmol/L (ref 96–106)
GFR calc Af Amer: 83 mL/min/{1.73_m2} (ref >59)
GFR, EST NON AFRICAN AMERICAN: 72 mL/min/{1.73_m2} (ref >59)
GLOBULIN, TOTAL: 2.4 g/dL (ref 1.5–4.5)
Glucose: 106 mg/dL — ABNORMAL HIGH (ref 65–99)
POTASSIUM: 4 mmol/L (ref 3.5–5.2)
SODIUM: 141 mmol/L (ref 134–144)
Total Protein: 6.8 g/dL (ref 6.0–8.5)

## 2018-04-19 LAB — CYTOLOGY - PAP
Diagnosis: NEGATIVE
HPV: NOT DETECTED

## 2018-04-19 LAB — URINALYSIS, ROUTINE W REFLEX MICROSCOPIC
Bilirubin, UA: NEGATIVE
Glucose, UA: NEGATIVE
Ketones, UA: NEGATIVE
Nitrite, UA: POSITIVE — AB
PH UA: 5.5 (ref 5.0–7.5)
Specific Gravity, UA: 1.025 (ref 1.005–1.030)
UUROB: 0.2 mg/dL (ref 0.2–1.0)

## 2018-04-19 NOTE — Telephone Encounter (Signed)
I called the patient.  X-ray of the left hip shows mild degenerative changes, nothing that would be causing significant pain.  If the left hip discomfort continues, I would recommend an orthopedic surgery referral.  The ALT and AST liver enzymes are elevated, we will recheck in 2 to 3 weeks, if this continues to climb the patient will need to come off of Rebif.  The patient has had a chronic mild elevation of ALT enzymes in the past.

## 2018-05-08 NOTE — Telephone Encounter (Signed)
Patient calling regarding lab order that was to be put in to check liver enzymes again. I advised Dr. Jannifer Franklin is not in the office and will send to his nurse. Please call and advise.

## 2018-05-08 NOTE — Addendum Note (Signed)
Addended by: Hope Pigeon on: 05/08/2018 03:53 PM   Modules accepted: Orders

## 2018-05-08 NOTE — Telephone Encounter (Signed)
I called and spoke with pt. Advised Dr. Jannifer Franklin did have reminder for her to come in for repeat LFT's. I will place order. She will come in tomorrow for this. Placed her on lab schedule at [facility] order in Epic.

## 2018-05-09 ENCOUNTER — Encounter: Payer: Self-pay | Admitting: *Deleted

## 2018-05-09 ENCOUNTER — Other Ambulatory Visit (INDEPENDENT_AMBULATORY_CARE_PROVIDER_SITE_OTHER): Payer: Self-pay

## 2018-05-09 DIAGNOSIS — Z0289 Encounter for other administrative examinations: Secondary | ICD-10-CM

## 2018-05-09 DIAGNOSIS — Z79899 Other long term (current) drug therapy: Secondary | ICD-10-CM | POA: Diagnosis not present

## 2018-05-09 DIAGNOSIS — G35 Multiple sclerosis: Secondary | ICD-10-CM

## 2018-05-10 LAB — COMPREHENSIVE METABOLIC PANEL
A/G RATIO: 1.7 (ref 1.2–2.2)
ALBUMIN: 4.2 g/dL (ref 3.6–4.8)
ALK PHOS: 63 IU/L (ref 39–117)
ALT: 75 IU/L — ABNORMAL HIGH (ref 0–32)
AST: 58 IU/L — ABNORMAL HIGH (ref 0–40)
BILIRUBIN TOTAL: 0.3 mg/dL (ref 0.0–1.2)
BUN / CREAT RATIO: 14 (ref 12–28)
BUN: 13 mg/dL (ref 8–27)
CHLORIDE: 102 mmol/L (ref 96–106)
CO2: 25 mmol/L (ref 20–29)
Calcium: 9.5 mg/dL (ref 8.7–10.3)
Creatinine, Ser: 0.95 mg/dL (ref 0.57–1.00)
GFR calc Af Amer: 73 mL/min/{1.73_m2} (ref >59)
GFR calc non Af Amer: 63 mL/min/{1.73_m2} (ref >59)
GLOBULIN, TOTAL: 2.5 g/dL (ref 1.5–4.5)
Glucose: 134 mg/dL — ABNORMAL HIGH (ref 65–99)
POTASSIUM: 4.9 mmol/L (ref 3.5–5.2)
SODIUM: 142 mmol/L (ref 134–144)
Total Protein: 6.7 g/dL (ref 6.0–8.5)

## 2018-05-10 NOTE — Telephone Encounter (Signed)
Pt requesting a call to discuss lab results.  °

## 2018-05-10 NOTE — Telephone Encounter (Signed)
I called the patient.  The liver enzymes have dropped, the patient however is of the mindset that she wants to get off of Rebif.  I will get a revisit set up sometime in the next several weeks, we may discuss switching to one on the oral medications versus going to an injectable such as Copaxone.

## 2018-05-11 NOTE — Telephone Encounter (Signed)
Called pt. Scheduled work in appt for 05/18/18 at 12pm, check in 1130am. She verbalized understanding/agreeable to appt time/date.

## 2018-05-18 ENCOUNTER — Encounter: Payer: Self-pay | Admitting: Neurology

## 2018-05-18 ENCOUNTER — Ambulatory Visit: Payer: 59 | Admitting: Neurology

## 2018-05-18 VITALS — BP 125/80 | HR 82 | Ht 62.0 in | Wt 172.0 lb

## 2018-05-18 DIAGNOSIS — G35 Multiple sclerosis: Secondary | ICD-10-CM

## 2018-05-18 NOTE — Progress Notes (Signed)
Glatiramer acetate 40mg /ml nrollment form faxed to [facility] at [phone]. Received fax confirmation.

## 2018-05-18 NOTE — Progress Notes (Addendum)
Reason for visit: Multiple sclerosis  Katherine Yu is an 67 y.o. female  History of present illness:  Katherine Yu is a 67 year old right-handed white female with a history of multiple sclerosis.  The patient has had some chronic elevations in her liver enzymes, she has been on Rebif, she has tolerated the medication well.  The patient however is concerned about her liver, she wishes to come off the medication completely and switch to something else.  The patient has been relatively stable with her multiple sclerosis.  She comes in today to discuss another treatment option for her MS.  The left hip pain that she was having on the last visit has completely disappeared.  Past Medical History:  Diagnosis Date  . Chronic insomnia   . Degenerative arthritis   . Dyslipidemia   . History of lower leg fracture   . History of peptic ulcer   . History of seizures    In childhood  . History of skull fracture   . Hypertension   . Migraine   . Mild obesity   . MS (multiple sclerosis) (Katherine Yu)   . Plantar fasciitis   . Rosacea   . Temporomandibular joint disease   . Tubal pregnancy     Past Surgical History:  Procedure Laterality Date  . APPENDECTOMY    . GALLBLADDER SURGERY    . metal plate resection     skull  . pilonidal cyst      Family History  Adopted: Yes  Problem Relation Age of Onset  . Lupus Sister   . Cancer Sister   . Heart attack Sister   . Heart attack Sister     Social history:  reports that she has never smoked. She has never used smokeless tobacco. She reports that she does not drink alcohol or use drugs.    Allergies  Allergen Reactions  . Niacin Anaphylaxis, Hives and Itching    Medications:  Prior to Admission medications   Medication Sig Start Date End Date Taking? Authorizing Provider  acetaminophen (TYLENOL) 650 MG CR tablet Take 650 mg by mouth every 8 (eight) hours as needed for pain.    [provider]  ALPRAZolam Duanne Moron) 0.5 MG  tablet Take 2 tablets approximately 45 minutes prior to the MRI study, take a third tablet if needed. 10/23/17   Kathrynn Ducking, MD  benazepril (LOTENSIN) 40 MG tablet Take 40 mg by mouth daily.  08/04/15   [provider]  Cholecalciferol (VITAMIN D3) 5000 UNITS TABS Take 5,000 Units by mouth daily.    [provider]  eletriptan (RELPAX) 40 MG tablet TAKE 1 TABLET (40 MG TOTAL) BY MOUTH AS NEEDED FOR MIGRAINE. MAY REPEAT IN 2 HOURS IF HEADACHE PERSISTS OR RECURS. 11/29/17   Kathrynn Ducking, MD  famotidine (PEPCID) 20 MG tablet Take 20 mg by mouth daily.    [provider]  hydrochlorothiazide (HYDRODIURIL) 50 MG tablet Take 25 mg by mouth daily.  09/22/15   [provider]  hydrOXYzine (VISTARIL) 25 MG capsule TAKE 1 CAPSULE BY MOUTH EVERY 6 HOURS AS NEEDED. 11/29/17   Kathrynn Ducking, MD  ibuprofen (ADVIL,MOTRIN) 200 MG tablet Take 200 mg by mouth as needed.    [provider]    ROS:  Out of a complete 14 system review of symptoms, the patient complains only of the following symptoms, and all other reviewed systems are negative.  Gait disorder  Blood pressure 125/80, pulse 82, height 5\' 2"  (  1.575 m), weight 172 lb (78 kg), SpO2 98 %.  Physical Exam  General: The patient is alert and cooperative at the time of the examination.  Skin: No significant peripheral edema is noted.   Neurologic Exam  Mental status: The patient is alert and oriented x 3 at the time of the examination. The patient has apparent normal recent and remote memory, with an apparently normal attention span and concentration ability.   Cranial nerves: Facial symmetry is present. Speech is normal, no aphasia or dysarthria is noted. Extraocular movements are full. Visual fields are full.  Motor: The patient has good strength in all 4 extremities.  Sensory examination: Soft touch sensation is symmetric on the face, arms, and legs.  Coordination: The patient has good  finger-nose-finger and heel-to-shin bilaterally.  Gait and station: The patient has a normal gait. Tandem gait is unsteady. Romberg is unsteady, the patient has a tendency to fall. No drift is seen.  Reflexes: Deep tendon reflexes are symmetric.   Assessment/Plan:  1.  Multiple sclerosis  2.  Mild gait disorder  The patient will switch to Copaxone, she has signed the forms today.  We will recheck her liver enzymes on her next visit.  She will contact our office if she has any tolerance issues with the new medication.  [name] MD 05/18/2018 12:55 PM  [facility] [address] [place], [place] [address]  Phone [phone] Fax [phone]

## 2018-05-24 DIAGNOSIS — Z23 Encounter for immunization: Secondary | ICD-10-CM | POA: Diagnosis not present

## 2018-05-25 ENCOUNTER — Other Ambulatory Visit: Payer: Self-pay | Admitting: Neurology

## 2018-05-29 DIAGNOSIS — R3915 Urgency of urination: Secondary | ICD-10-CM | POA: Diagnosis not present

## 2018-05-29 DIAGNOSIS — R300 Dysuria: Secondary | ICD-10-CM | POA: Diagnosis not present

## 2018-05-29 DIAGNOSIS — R350 Frequency of micturition: Secondary | ICD-10-CM | POA: Diagnosis not present

## 2018-06-18 ENCOUNTER — Telehealth: Payer: Self-pay | Admitting: Neurology

## 2018-06-18 NOTE — Telephone Encounter (Signed)
I called [employer] and made them aware that we need them to do a benefits investigation and that will tell them who her specialty pharmacy is. They voiced understanding and will get that completed and will contact the patient.

## 2018-06-18 NOTE — Telephone Encounter (Signed)
Blanch Media with [employer] requesting a call at [phone] to discuss the status of medication   Glatiramer Acetate 40 MG/ML SOSY  And is needing to know the pts specialty pharmacy

## 2018-06-19 NOTE — Telephone Encounter (Signed)
Blanch Media from [facility] called again asking if we had sent this medication to a local pharmacy. I advised her that the medication has not been sent to any pharmacy. She says that their benefits investigation "did not produce a pharmacy" for them. She says that she is going to reach out to the patient to see if the patient is aware of her insurances preferred specialty pharmacy.

## 2018-06-21 ENCOUNTER — Other Ambulatory Visit: Payer: Self-pay | Admitting: *Deleted

## 2018-06-21 ENCOUNTER — Other Ambulatory Visit: Payer: Self-pay | Admitting: Pharmacist

## 2018-06-21 NOTE — Telephone Encounter (Signed)
[facility] outpt pharmacy requesting a call at [phone] to discuss rx

## 2018-06-21 NOTE — Telephone Encounter (Signed)
I called [facility] back. Spoke with Alvie Heidelberg. She placed me on hold and call transferred to Charisse Klinefelter. Marketing executive). Went to his VM. I left message asking him to call back to discuss

## 2018-06-21 NOTE — Telephone Encounter (Addendum)
I called [employer] and they advised [place] that when they reached out to pt yesterday, the pt unenrolled and did not want any further contact from them. They closed her case. Pt spoke with Benjamine Mola from [facility] yesterday. They said they tried to do benefit investigation but were considered 3rd party on 06-11-18 and unable to complete. They could not find out specialty pharmacy info either d.t being 3rd party.   Called pt insurance. Pt uses [employer] for pharmacy benefits. Can send rx to either [facility] or [facility] outpt pharmacy.  PA completed over the phone. PA approved: [id number]. Should have determination in 24-72 hr.  Faxed over enrollment form to [facility]. Fax: [phone]. Received fax confirmation.

## 2018-06-21 NOTE — Telephone Encounter (Signed)
Richardson Landry called back. Wanted to know if [employer] was going to supply the whisperject autoinjector device. They are unable to get this for the patient. Advised I will have to contact [employer] to clarify. I will call back to let him know.  Did advised PA completed and pending. We will notify them if approved.   I called [employer] and spoke with Malachy Mood, RN. She will contact [facility] to coordinate getting pt whisperject and set up shipment. Advised her to speak with either Richardson Landry or [facility].  I told her I will contact the pt about whether she needs injection training. If pt agrees, she will need to contact [employer] herself to get this set up. Their phone number: [phone]

## 2018-06-21 NOTE — Telephone Encounter (Signed)
Pt called back. She verified she opted out of [employer] because they were calling her 2-3x/week. She spoke with nurse with [employer] extensively and they instructed her how to go to their website to do injection and watch training video. She declined offer to have nurse education via [employer].  She does not want to re-enroll with [employer] at this point. She is aware rx sent to [facility] will coordinate with them to get her the whisperject auto inj. I will f/u tomorrow to make sure they contacted her pharmacy.  She is also aware PA pending via insurance.   I ended up calling [facility] pharmacy back and spoke with Richardson Landry. Relayed information above. He verbalized understanding.   Advised I will update them once we have determination on PA.

## 2018-06-21 NOTE — Telephone Encounter (Signed)
I called and spoke with husband, Mikeal Hawthorne (on [place]). Explained situation. He is unsure why wife asked [employer] not to contact her anymore. She was not available to speak with. He will have her call back once she is available.

## 2018-06-22 ENCOUNTER — Other Ambulatory Visit: Payer: Self-pay | Admitting: Pharmacist

## 2018-06-22 MED ORDER — GLATIRAMER ACETATE 40 MG/ML ~~LOC~~ SOSY: 40.0000 mg | PREFILLED_SYRINGE | SUBCUTANEOUS | 12 refills | Status: DC

## 2018-06-22 NOTE — Telephone Encounter (Signed)
I called pt insurance to check on status of PA. PA still pending.

## 2018-06-22 NOTE — Telephone Encounter (Signed)
I called and spoke with Richardson Landry at [facility]. They did not hear from [employer] yet about getting pt whisperject auto inj.   I called [employer] and spoke with Anderson Malta, RN. She states they will not ship auto injector until they know the pharmacy as dispensed the medication. They will ensure correct med dispneced with the pharmacy as well. They will f/u with pharmacy every other day to get updates.  I called [facility] back and spoke with Richardson Landry. Relayed above info. Advised PA pending. Will update them once we get determination. He verbalized understanding.

## 2018-06-25 NOTE — Telephone Encounter (Signed)
Fax received from [facility]. Glatiramer 40mg   approved for dates 06/25/18 thru 06/25/19. PA# [id number]. Plan Name: [place]. Plan Code: [id number].  Copy of approval faxed to [facility]/fim

## 2018-06-26 NOTE — Telephone Encounter (Signed)
Noted, we are aware thank you.

## 2018-06-26 NOTE — Telephone Encounter (Signed)
[email or web address] - [place], [place] - [address].  has called to inform that the PA for the Copaxone has been approved.  Richardson Landry said if there are questions he can be called

## 2018-07-04 DIAGNOSIS — N3946 Mixed incontinence: Secondary | ICD-10-CM | POA: Diagnosis not present

## 2018-07-04 DIAGNOSIS — N814 Uterovaginal prolapse, unspecified: Secondary | ICD-10-CM | POA: Diagnosis not present

## 2018-07-04 DIAGNOSIS — G35 Multiple sclerosis: Secondary | ICD-10-CM | POA: Diagnosis not present

## 2018-07-04 DIAGNOSIS — N816 Rectocele: Secondary | ICD-10-CM | POA: Diagnosis not present

## 2018-07-04 DIAGNOSIS — K469 Unspecified abdominal hernia without obstruction or gangrene: Secondary | ICD-10-CM | POA: Diagnosis not present

## 2018-07-31 DIAGNOSIS — H25013 Cortical age-related cataract, bilateral: Secondary | ICD-10-CM | POA: Diagnosis not present

## 2018-07-31 DIAGNOSIS — H43393 Other vitreous opacities, bilateral: Secondary | ICD-10-CM | POA: Diagnosis not present

## 2018-07-31 DIAGNOSIS — H40013 Open angle with borderline findings, low risk, bilateral: Secondary | ICD-10-CM | POA: Diagnosis not present

## 2018-07-31 DIAGNOSIS — H2513 Age-related nuclear cataract, bilateral: Secondary | ICD-10-CM | POA: Diagnosis not present

## 2018-07-31 DIAGNOSIS — H524 Presbyopia: Secondary | ICD-10-CM | POA: Diagnosis not present

## 2018-08-09 ENCOUNTER — Telehealth: Payer: Self-pay | Admitting: Neurology

## 2018-08-09 DIAGNOSIS — Z5181 Encounter for therapeutic drug level monitoring: Secondary | ICD-10-CM

## 2018-08-09 NOTE — Telephone Encounter (Signed)
I called the patient.  The patient will come in for a recheck of the liver enzymes, she is off of Rebif, now on Copaxone, she has stopped taking ibuprofen.  I will place the orders.

## 2018-08-10 ENCOUNTER — Other Ambulatory Visit (INDEPENDENT_AMBULATORY_CARE_PROVIDER_SITE_OTHER): Payer: Self-pay

## 2018-08-10 DIAGNOSIS — Z5181 Encounter for therapeutic drug level monitoring: Secondary | ICD-10-CM

## 2018-08-10 DIAGNOSIS — Z0289 Encounter for other administrative examinations: Secondary | ICD-10-CM

## 2018-08-11 LAB — COMPREHENSIVE METABOLIC PANEL
ALT: 41 IU/L — ABNORMAL HIGH (ref 0–32)
AST: 28 IU/L (ref 0–40)
Albumin/Globulin Ratio: 1.7 (ref 1.2–2.2)
Albumin: 4.4 g/dL (ref 3.6–4.8)
Alkaline Phosphatase: 81 IU/L (ref 39–117)
BUN/Creatinine Ratio: 17 (ref 12–28)
BUN: 18 mg/dL (ref 8–27)
Bilirubin Total: 0.4 mg/dL (ref 0.0–1.2)
CO2: 25 mmol/L (ref 20–29)
Calcium: 10 mg/dL (ref 8.7–10.3)
Chloride: 102 mmol/L (ref 96–106)
Creatinine, Ser: 1.03 mg/dL — ABNORMAL HIGH (ref 0.57–1.00)
GFR calc Af Amer: 66 mL/min/{1.73_m2} (ref >59)
GFR calc non Af Amer: 57 mL/min/{1.73_m2} — ABNORMAL LOW (ref >59)
Globulin, Total: 2.6 g/dL (ref 1.5–4.5)
Glucose: 103 mg/dL — ABNORMAL HIGH (ref 65–99)
Potassium: 4.2 mmol/L (ref 3.5–5.2)
Sodium: 143 mmol/L (ref 134–144)
Total Protein: 7 g/dL (ref 6.0–8.5)

## 2018-10-23 ENCOUNTER — Ambulatory Visit: Payer: 59 | Admitting: Adult Health

## 2018-10-23 ENCOUNTER — Encounter: Payer: Self-pay | Admitting: Neurology

## 2018-10-23 ENCOUNTER — Ambulatory Visit: Payer: 59 | Admitting: Neurology

## 2018-10-23 VITALS — BP 116/70 | HR 83 | Ht 67.0 in | Wt 177.0 lb

## 2018-10-23 DIAGNOSIS — G35 Multiple sclerosis: Secondary | ICD-10-CM | POA: Diagnosis not present

## 2018-10-23 DIAGNOSIS — G43009 Migraine without aura, not intractable, without status migrainosus: Secondary | ICD-10-CM | POA: Diagnosis not present

## 2018-10-23 DIAGNOSIS — Z5181 Encounter for therapeutic drug level monitoring: Secondary | ICD-10-CM | POA: Diagnosis not present

## 2018-10-23 NOTE — Progress Notes (Signed)
Reason for visit: Multiple sclerosis  Katherine Yu is an 68 y.o. female  History of present illness:  Katherine Yu is a 68 year old right-handed white female with a history of multiple sclerosis.  The patient has a mild gait instability issue with this, she does not fall, she uses a cane for ambulation.  She has been switched off of the beta interferon to Copaxone secondary to elevation in liver function tests.  The patient has had blood work before she started Copaxone in December 2019 with near normalization of her liver enzymes.  She returns today for an evaluation.  She does have some problems with injection site reactions to the Copaxone and she has had some flulike symptoms after each injection.  The patient otherwise is tolerating the medication fairly well.  She returns to this office for an evaluation, she has not noted any new symptoms referable to the multiple sclerosis.  Past Medical History:  Diagnosis Date  . Chronic insomnia   . Degenerative arthritis   . Dyslipidemia   . History of lower leg fracture   . History of peptic ulcer   . History of seizures    In childhood  . History of skull fracture   . Hypertension   . Migraine   . Mild obesity   . MS (multiple sclerosis) ([place])   . Plantar fasciitis   . Rosacea   . Temporomandibular joint disease   . Tubal pregnancy     Past Surgical History:  Procedure Laterality Date  . APPENDECTOMY    . GALLBLADDER SURGERY    . metal plate resection     skull  . pilonidal cyst      Family History  Adopted: Yes  Problem Relation Age of Onset  . Lupus Sister   . Cancer Sister   . Heart attack Sister   . Heart attack Sister     Social history:  reports that she has never smoked. She has never used smokeless tobacco. She reports that she does not drink alcohol or use drugs.    Allergies  Allergen Reactions  . Niacin Anaphylaxis, Hives and Itching    Medications:  Prior to Admission medications   Medication  Sig Start Date End Date Taking? Authorizing Provider  acetaminophen (TYLENOL) 650 MG CR tablet Take 650 mg by mouth every 8 (eight) hours as needed for pain.   Yes [provider]  benazepril (LOTENSIN) 40 MG tablet Take 40 mg by mouth daily.  08/04/15  Yes [provider]  Cholecalciferol (VITAMIN D3) 5000 UNITS TABS Take 5,000 Units by mouth daily.   Yes [provider]  eletriptan (RELPAX) 40 MG tablet TAKE 1 TABLET (40 MG TOTAL) BY MOUTH AS NEEDED FOR MIGRAINE. MAY REPEAT IN 2 HOURS IF HEADACHE PERSISTS OR RECURS. 11/29/17  Yes Kathrynn Ducking, MD  famotidine (PEPCID) 20 MG tablet Take 20 mg by mouth daily.   Yes [provider]  Glatiramer Acetate 40 MG/ML SOSY Inject 40 mg into the skin 3 (three) times a week. 06/22/18  Yes Tresa Garter, MD  hydrochlorothiazide (HYDRODIURIL) 50 MG tablet Take 25 mg by mouth daily.  09/22/15  Yes [provider]  hydrOXYzine (VISTARIL) 25 MG capsule TAKE 1 CAPSULE BY MOUTH EVERY 6 HOURS AS NEEDED. 05/25/18  Yes Kathrynn Ducking, MD    ROS:  Out of a complete 14 system review of symptoms, the patient complains only of the following symptoms, and all other reviewed systems are negative.  Fatigue Difficulty swallowing Eye itching, light sensitivity Cough Palpitations of the heart Cold intolerance, heat intolerance Diarrhea Insomnia, frequent waking Incontinence of the bladder, frequency of urination, urinary urgency  Joint pain, back pain, muscle cramps, neck pain Itching Bruising easily Memory loss, headache, weakness   Blood pressure 116/70, pulse 83, height 5\' 2"  (1.575 m), weight 177 lb (80.3 kg), SpO2 96 %.  Physical Exam  General: The patient is alert and cooperative at the time of the examination.  Skin: No significant peripheral edema is noted.   Neurologic Exam  Mental status: The patient is alert and oriented x 3 at the time of the examination. The patient has apparent normal  recent and remote memory, with an apparently normal attention span and concentration ability.   Cranial nerves: Facial symmetry is present. Speech is normal, no aphasia or dysarthria is noted. Extraocular movements are full. Visual fields are full.  Pupils are equal, round, and reactive to light.  Discs are flat bilaterally.  Motor: The patient has good strength in all 4 extremities.  Sensory examination: Soft touch sensation is symmetric on the face, arms, and legs.  Coordination: The patient has good finger-nose-finger and heel-to-shin bilaterally.  Gait and station: The patient has a slightly limping gait on the right leg.  The patient has mild instability with tandem gait.  Romberg is positive, the patient tends to fall backwards.  Reflexes: Deep tendon reflexes are symmetric.   Assessment/Plan:  1.  Multiple sclerosis  2.  Mild gait disorder  3.  History of elevated liver enzymes  The patient will have blood work checked today on the Copaxone.  The liver enzymes nearly normalized prior to onset of the Copaxone.  She is still having some malaise after each injection.  The patient will follow-up in 6 months.  [name] MD 10/23/2018 10:19 AM  [facility] [address] [place], [place] [address]  Phone [phone] Fax [phone]

## 2018-10-24 LAB — COMPREHENSIVE METABOLIC PANEL
A/G RATIO: 1.7 (ref 1.2–2.2)
ALT: 30 IU/L (ref 0–32)
AST: 27 IU/L (ref 0–40)
Albumin: 4.7 g/dL (ref 3.8–4.8)
Alkaline Phosphatase: 89 IU/L (ref 39–117)
BILIRUBIN TOTAL: 0.4 mg/dL (ref 0.0–1.2)
BUN/Creatinine Ratio: 25 (ref 12–28)
BUN: 26 mg/dL (ref 8–27)
CHLORIDE: 99 mmol/L (ref 96–106)
CO2: 25 mmol/L (ref 20–29)
Calcium: 10.1 mg/dL (ref 8.7–10.3)
Creatinine, Ser: 1.05 mg/dL — ABNORMAL HIGH (ref 0.57–1.00)
GFR calc Af Amer: 64 mL/min/{1.73_m2} (ref >59)
GFR calc non Af Amer: 56 mL/min/{1.73_m2} — ABNORMAL LOW (ref >59)
GLOBULIN, TOTAL: 2.7 g/dL (ref 1.5–4.5)
Glucose: 115 mg/dL — ABNORMAL HIGH (ref 65–99)
POTASSIUM: 4.6 mmol/L (ref 3.5–5.2)
SODIUM: 140 mmol/L (ref 134–144)
Total Protein: 7.4 g/dL (ref 6.0–8.5)

## 2018-10-26 ENCOUNTER — Encounter: Payer: Self-pay | Admitting: Pharmacist

## 2018-10-26 ENCOUNTER — Ambulatory Visit (INDEPENDENT_AMBULATORY_CARE_PROVIDER_SITE_OTHER): Payer: 59 | Admitting: Pharmacist

## 2018-10-26 DIAGNOSIS — Z79899 Other long term (current) drug therapy: Secondary | ICD-10-CM

## 2018-10-26 NOTE — Progress Notes (Signed)
S: Patient contacted today for follow up with the [facility].  Patient is currently taking Copaxone for MS. Patient is managed by Dr. Jannifer Franklin for this. She reports that she is doing well with the Copaxone. Has been on Rebif in the past.  Adherence: denies any missed doses  Dosing: Copaxone 40 mg 3 times weekly  Dose adjustments: Hepatic: none needed  Drug-drug interactions: none  Monitoring: CBC: WNL LFTs: have normalized Pain/weakness: denies GI upset: denies Hypersensitivity: denies    O:     Lab Results  Component Value Date   WBC 4.3 04/18/2018   HGB 13.7 04/18/2018   HCT 41.4 04/18/2018   MCV 90 04/18/2018   PLT 198 04/18/2018      Chemistry      Component Value Date/Time   NA 140 10/23/2018 1031   K 4.6 10/23/2018 1031   CL 99 10/23/2018 1031   CO2 25 10/23/2018 1031   BUN 26 10/23/2018 1031   CREATININE 1.05 (H) 10/23/2018 1031      Component Value Date/Time   CALCIUM 10.1 10/23/2018 1031   ALKPHOS 89 10/23/2018 1031   AST 27 10/23/2018 1031   ALT 30 10/23/2018 1031   BILITOT 0.4 10/23/2018 1031       A/P: 1. Medication review: Patient on Copaxone for MS and is tolerating the medication well. LFTs have normalized. Provided education on medication including adverse effects, such as GI upset (nausea) and hypersensitivity reactions. No recommendations for any changes. Patient to follow up with Dr. Jannifer Franklin as directed.    Christella Hartigan, PharmD, BCPS, BCACP, CPP Clinical Pharmacist Practitioner  [phone]

## 2018-12-03 ENCOUNTER — Other Ambulatory Visit: Payer: Self-pay | Admitting: Neurology

## 2019-01-17 DIAGNOSIS — I10 Essential (primary) hypertension: Secondary | ICD-10-CM | POA: Diagnosis not present

## 2019-01-18 LAB — LAB REPORT - SCANNED: EGFR (Non-African Amer.): 61

## 2019-02-12 ENCOUNTER — Other Ambulatory Visit: Payer: Self-pay | Admitting: Neurology

## 2019-04-22 ENCOUNTER — Other Ambulatory Visit: Payer: Self-pay | Admitting: Obstetrics and Gynecology

## 2019-04-22 DIAGNOSIS — Z01419 Encounter for gynecological examination (general) (routine) without abnormal findings: Secondary | ICD-10-CM | POA: Diagnosis not present

## 2019-04-22 DIAGNOSIS — N814 Uterovaginal prolapse, unspecified: Secondary | ICD-10-CM | POA: Diagnosis not present

## 2019-04-22 DIAGNOSIS — Z1231 Encounter for screening mammogram for malignant neoplasm of breast: Secondary | ICD-10-CM

## 2019-04-22 DIAGNOSIS — N816 Rectocele: Secondary | ICD-10-CM | POA: Diagnosis not present

## 2019-04-22 NOTE — Progress Notes (Signed)
PATIENT: Katherine Yu DOB: 01/16/1951  REASON FOR VISIT: follow up HISTORY FROM: patient  HISTORY OF PRESENT ILLNESS: Today 04/23/19  Katherine Yu is a 68 year old female with history of multiple sclerosis.  As result, she has issues with mild gait instability.  She uses a cane for ambulation.  She has been switched off Rebif to Copaxone secondary to elevation in liver enzymes.  She has had side effect of injection site reaction and some flulike symptoms with Copaxone.  She also had MRI of the brain and cervical spine in February 2019, showed good stability of MS lesions from prior study 3 years ago.  There is no disease in the spinal cord.  She reports she is tolerating Copaxone well.  She denies any new numbness or weakness in her arms or legs.  She denies any changes to her bowels, bladder, or vision.  She lives with her husband.  She reports she has had a few falls since last seen, but does well if she uses her cane, and takes her time.  She enjoys reading and spending time outside.  She remains on Relpax for acute headache treatment.  She presents today for follow-up unaccompanied.  HISTORY 10/23/2018 Dr. Jannifer Franklin: Katherine Yu is a 68 year old right-handed white female with a history of multiple sclerosis.  The patient has a mild gait instability issue with this, she does not fall, she uses a cane for ambulation.  She has been switched off of the beta interferon to Copaxone secondary to elevation in liver function tests.  The patient has had blood work before she started Copaxone in December 2019 with near normalization of her liver enzymes.  She returns today for an evaluation.  She does have some problems with injection site reactions to the Copaxone and she has had some flulike symptoms after each injection.  The patient otherwise is tolerating the medication fairly well.  She returns to this office for an evaluation, she has not noted any new symptoms referable to the multiple sclerosis.   REVIEW OF SYSTEMS: Out of a complete 14 system review of symptoms, the patient complains only of the following symptoms, and all other reviewed systems are negative.  Gait instability, falls  ALLERGIES: Allergies  Allergen Reactions  . Niacin Anaphylaxis, Hives and Itching    HOME MEDICATIONS: Outpatient Medications Prior to Visit  Medication Sig Dispense Refill  . acetaminophen (TYLENOL) 650 MG CR tablet Take 650 mg by mouth every 8 (eight) hours as needed for pain.    . benazepril (LOTENSIN) 40 MG tablet Take 40 mg by mouth daily.   4  . Cholecalciferol (VITAMIN D3) 5000 UNITS TABS Take 5,000 Units by mouth daily.    Marland Kitchen eletriptan (RELPAX) 40 MG tablet TAKE 1 TABLET (40 MG TOTAL) BY MOUTH AS NEEDED FOR MIGRAINE. MAY REPEAT IN 2 HOURS IF HEADACHE PERSISTS OR RECURS. 24 tablet 5  . famotidine (PEPCID) 20 MG tablet Take 20 mg by mouth daily.    Marland Kitchen Glatiramer Acetate 40 MG/ML SOSY Inject 40 mg into the skin 3 (three) times a week. 12 mL 12  . hydrochlorothiazide (HYDRODIURIL) 50 MG tablet Take 25 mg by mouth daily.   0  . hydrOXYzine (VISTARIL) 25 MG capsule TAKE 1 CAPSULE BY MOUTH EVERY 6 HOURS AS NEEDED. 30 capsule 5  . ibuprofen (ADVIL) 100 MG/5ML suspension Take 200 mg by mouth every 4 (four) hours as needed. As needed otc.    . eletriptan (RELPAX) 40 MG tablet  No facility-administered medications prior to visit.     PAST MEDICAL HISTORY: Past Medical History:  Diagnosis Date  . Chronic insomnia   . Degenerative arthritis   . Dyslipidemia   . History of lower leg fracture   . History of peptic ulcer   . History of seizures    In childhood  . History of skull fracture   . Hypertension   . Migraine   . Mild obesity   . MS (multiple sclerosis) ([place])   . Plantar fasciitis   . Rosacea   . Temporomandibular joint disease   . Tubal pregnancy     PAST SURGICAL HISTORY: Past Surgical History:  Procedure Laterality Date  . APPENDECTOMY    . GALLBLADDER SURGERY    .  metal plate resection     skull  . pilonidal cyst      FAMILY HISTORY: Family History  Adopted: Yes  Problem Relation Age of Onset  . Lupus Sister   . Cancer Sister   . Heart attack Sister   . Heart attack Sister     SOCIAL HISTORY: Social History   Socioeconomic History  . Marital status: Married    Spouse name: Not on file  . Number of children: 3  . Years of education: Masters  . Highest education level: Not on file  Occupational History  . Occupation: Equities trader  Social Needs  . Financial resource strain: Not on file  . Food insecurity    Worry: Not on file    Inability: Not on file  . Transportation needs    Medical: Not on file    Non-medical: Not on file  Tobacco Use  . Smoking status: Never Smoker  . Smokeless tobacco: Never Used  Substance and Sexual Activity  . Alcohol use: No  . Drug use: No  . Sexual activity: Not on file  Lifestyle  . Physical activity    Days per week: Not on file    Minutes per session: Not on file  . Stress: Not on file  Relationships  . Social Herbalist on phone: Not on file    Gets together: Not on file    Attends religious service: Not on file    Active member of club or organization: Not on file    Attends meetings of clubs or organizations: Not on file    Relationship status: Not on file  . Intimate partner violence    Fear of current or ex partner: Not on file    Emotionally abused: Not on file    Physically abused: Not on file    Forced sexual activity: Not on file  Other Topics Concern  . Not on file  Social History Narrative   Patient is right handed.   Patient drinks 1 cup caffeine daily.    PHYSICAL EXAM  Vitals:   04/23/19 1057  BP: 121/75  Pulse: 75  Temp: 98 F (36.7 C)  Weight: 181 lb 6.4 oz (82.3 kg)  Height: 5' 6.5" (1.689 m)   Body mass index is 28.84 kg/m.  Generalized: Well developed, in no acute distress   Neurological examination  Mentation: Alert oriented to  time, place, history taking. Follows all commands speech and language fluent Cranial nerve II-XII: Pupils were equal round reactive to light. Extraocular movements were full, visual field were full on confrontational test. Facial sensation and strength were normal. Head turning and shoulder shrug  were normal and symmetric. Motor: The motor testing reveals 5  over 5 strength of all 4 extremities. Good symmetric motor tone is noted throughout.  Sensory: Sensory testing is intact to soft touch on all 4 extremities. No evidence of extinction is noted.  Coordination: Cerebellar testing reveals good finger-nose-finger and heel-to-shin bilaterally.  Gait and station: She has a limping gait on the right, using a cane.  Tandem gait is mildly unsteady. Romberg is positive. No drift is seen.  Reflexes: Deep tendon reflexes are symmetric and normal bilaterally.   DIAGNOSTIC DATA (LABS, IMAGING, TESTING) - I reviewed patient records, labs, notes, testing and imaging myself where available.  Lab Results  Component Value Date   WBC 4.3 04/18/2018   HGB 13.7 04/18/2018   HCT 41.4 04/18/2018   MCV 90 04/18/2018   PLT 198 04/18/2018      Component Value Date/Time   NA 140 10/23/2018 1031   K 4.6 10/23/2018 1031   CL 99 10/23/2018 1031   CO2 25 10/23/2018 1031   GLUCOSE 115 (H) 10/23/2018 1031   GLUCOSE 115 (H) 08/24/2010 1003   BUN 26 10/23/2018 1031   CREATININE 1.05 (H) 10/23/2018 1031   CALCIUM 10.1 10/23/2018 1031   PROT 7.4 10/23/2018 1031   ALBUMIN 4.7 10/23/2018 1031   AST 27 10/23/2018 1031   ALT 30 10/23/2018 1031   ALKPHOS 89 10/23/2018 1031   BILITOT 0.4 10/23/2018 1031   GFRNONAA 56 (L) 10/23/2018 1031   GFRAA 64 10/23/2018 1031   Lab Results  Component Value Date   CHOL 171 01/17/2008   HDL 36.9 (L) 01/17/2008   LDLCALC 107 (H) 01/17/2008   TRIG 136 01/17/2008   CHOLHDL 4.6 CALC 01/17/2008   No results found for: HGBA1C No results found for: VITAMINB12 No results found  for: TSH    ASSESSMENT AND PLAN 68 y.o. year old female  has a past medical history of Chronic insomnia, Degenerative arthritis, Dyslipidemia, History of lower leg fracture, History of peptic ulcer, History of seizures, History of skull fracture, Hypertension, Migraine, Mild obesity, MS (multiple sclerosis) ([facility]), Plantar fasciitis, Rosacea, Temporomandibular joint disease, and Tubal pregnancy. here with:  1.  Multiple sclerosis 2.  Gait instability  She has remained stable since last visit. She will continue on Copaxone.  I will check lab work today.  In the past she has had elevated liver enzymes while taking rebif, was subsequently switched to Copaxone.  She has done well on the medication.  She will follow-up in 6 months or sooner if needed.  I advised that if her symptoms worsen or she develops any new symptoms she is let us know.  She says she does not need refills at this time, we are prescribing her Copaxone, Relpax.    I spent 15 minutes with the patient. 50% of this time was spent discussing her plan of care.   Butler Denmark, AGNP-C, DNP 04/23/2019, 11:28 AM [facility] [address] [place], [place] [address] [phone]

## 2019-04-23 ENCOUNTER — Encounter: Payer: Self-pay | Admitting: Neurology

## 2019-04-23 ENCOUNTER — Other Ambulatory Visit: Payer: Self-pay

## 2019-04-23 ENCOUNTER — Ambulatory Visit: Payer: 59 | Admitting: Neurology

## 2019-04-23 VITALS — BP 121/75 | HR 75 | Temp 98.0°F | Ht 66.5 in | Wt 181.4 lb

## 2019-04-23 DIAGNOSIS — G35 Multiple sclerosis: Secondary | ICD-10-CM | POA: Diagnosis not present

## 2019-04-24 ENCOUNTER — Other Ambulatory Visit: Payer: Self-pay

## 2019-04-24 ENCOUNTER — Ambulatory Visit
Admission: RE | Admit: 2019-04-24 | Discharge: 2019-04-24 | Disposition: A | Discharge status: Home / Self Care | Payer: 59 | Source: Ambulatory Visit | Attending: Obstetrics and Gynecology | Admitting: Obstetrics and Gynecology

## 2019-04-24 ENCOUNTER — Telehealth: Payer: Self-pay | Admitting: Neurology

## 2019-04-24 DIAGNOSIS — Z1231 Encounter for screening mammogram for malignant neoplasm of breast: Secondary | ICD-10-CM

## 2019-04-24 DIAGNOSIS — Z1211 Encounter for screening for malignant neoplasm of colon: Secondary | ICD-10-CM | POA: Diagnosis not present

## 2019-04-24 DIAGNOSIS — G35 Multiple sclerosis: Secondary | ICD-10-CM

## 2019-04-24 LAB — COMPREHENSIVE METABOLIC PANEL
ALT: 46 IU/L — ABNORMAL HIGH (ref 0–32)
AST: 29 IU/L (ref 0–40)
Albumin/Globulin Ratio: 1.9 (ref 1.2–2.2)
Albumin: 4.6 g/dL (ref 3.8–4.8)
Alkaline Phosphatase: 91 IU/L (ref 39–117)
BUN/Creatinine Ratio: 16 (ref 12–28)
BUN: 16 mg/dL (ref 8–27)
Bilirubin Total: 0.2 mg/dL (ref 0.0–1.2)
CO2: 25 mmol/L (ref 20–29)
Calcium: 9.8 mg/dL (ref 8.7–10.3)
Chloride: 102 mmol/L (ref 96–106)
Creatinine, Ser: 0.97 mg/dL (ref 0.57–1.00)
GFR calc Af Amer: 70 mL/min/{1.73_m2} (ref >59)
GFR calc non Af Amer: 61 mL/min/{1.73_m2} (ref >59)
Globulin, Total: 2.4 g/dL (ref 1.5–4.5)
Glucose: 103 mg/dL — ABNORMAL HIGH (ref 65–99)
Potassium: 4.2 mmol/L (ref 3.5–5.2)
Sodium: 142 mmol/L (ref 134–144)
Total Protein: 7 g/dL (ref 6.0–8.5)

## 2019-04-24 LAB — CBC WITH DIFFERENTIAL/PLATELET
Basophils Absolute: 0.1 10*3/uL (ref 0.0–0.2)
Basos: 1 %
EOS (ABSOLUTE): 0.4 10*3/uL (ref 0.0–0.4)
Eos: 8 %
Hematocrit: 42.2 % (ref 34.0–46.6)
Hemoglobin: 14.6 g/dL (ref 11.1–15.9)
Immature Grans (Abs): 0 10*3/uL (ref 0.0–0.1)
Immature Granulocytes: 0 %
Lymphocytes Absolute: 1.6 10*3/uL (ref 0.7–3.1)
Lymphs: 33 %
MCH: 30.1 pg (ref 26.6–33.0)
MCHC: 34.6 g/dL (ref 31.5–35.7)
MCV: 87 fL (ref 79–97)
Monocytes Absolute: 0.4 10*3/uL (ref 0.1–0.9)
Monocytes: 9 %
Neutrophils Absolute: 2.4 10*3/uL (ref 1.4–7.0)
Neutrophils: 49 %
Platelets: 243 10*3/uL (ref 150–450)
RBC: 4.85 x10E6/uL (ref 3.77–5.28)
RDW: 13 % (ref 11.7–15.4)
WBC: 4.8 10*3/uL (ref 3.4–10.8)

## 2019-04-24 NOTE — Telephone Encounter (Signed)
Error

## 2019-04-24 NOTE — Telephone Encounter (Signed)
I called the patient.  Her ALT came back mildly elevated at 46.  In the past she has  had elevated liver enzymes while taking Rebif, was subsequently switched to Copaxone.  We will recheck CMP in 1 month.  She has been taking ibuprofen about 3 times a week  She will try to limit her ibuprofen consumption.  I will go and place the order.

## 2019-04-25 NOTE — Progress Notes (Signed)
I have read the note, and I agree with the clinical assessment and plan.  Atiba Kimberlin K Kristell Wooding   

## 2019-05-21 ENCOUNTER — Other Ambulatory Visit (INDEPENDENT_AMBULATORY_CARE_PROVIDER_SITE_OTHER): Payer: Self-pay

## 2019-05-21 ENCOUNTER — Other Ambulatory Visit: Payer: Self-pay

## 2019-05-21 DIAGNOSIS — G35 Multiple sclerosis: Secondary | ICD-10-CM | POA: Diagnosis not present

## 2019-05-21 DIAGNOSIS — Z0289 Encounter for other administrative examinations: Secondary | ICD-10-CM

## 2019-05-22 ENCOUNTER — Telehealth: Payer: Self-pay | Admitting: *Deleted

## 2019-05-22 LAB — COMPREHENSIVE METABOLIC PANEL
ALT: 36 IU/L — ABNORMAL HIGH (ref 0–32)
AST: 31 IU/L (ref 0–40)
Albumin/Globulin Ratio: 1.8 (ref 1.2–2.2)
Albumin: 4.4 g/dL (ref 3.8–4.8)
Alkaline Phosphatase: 90 IU/L (ref 39–117)
BUN/Creatinine Ratio: 12 (ref 12–28)
BUN: 15 mg/dL (ref 8–27)
Bilirubin Total: 0.3 mg/dL (ref 0.0–1.2)
CO2: 25 mmol/L (ref 20–29)
Calcium: 9.7 mg/dL (ref 8.7–10.3)
Chloride: 103 mmol/L (ref 96–106)
Creatinine, Ser: 1.22 mg/dL — ABNORMAL HIGH (ref 0.57–1.00)
GFR calc Af Amer: 53 mL/min/{1.73_m2} — ABNORMAL LOW (ref >59)
GFR calc non Af Amer: 46 mL/min/{1.73_m2} — ABNORMAL LOW (ref >59)
Globulin, Total: 2.4 g/dL (ref 1.5–4.5)
Glucose: 128 mg/dL — ABNORMAL HIGH (ref 65–99)
Potassium: 4.6 mmol/L (ref 3.5–5.2)
Sodium: 142 mmol/L (ref 134–144)
Total Protein: 6.8 g/dL (ref 6.0–8.5)

## 2019-05-22 NOTE — Telephone Encounter (Signed)
-----   Message from [name], NP sent at 05/22/2019  9:36 AM EDT ----- Lovey Newcomer, please call patient, ALT has improved, still mildly elevated. Can continue on Copaxone. Will recheck at next office visit. Had to switch off Rebif to Copaxone due to elevated liver enzymes.

## 2019-05-22 NOTE — Telephone Encounter (Signed)
I LM with Sam, spouse of pt and relayed that ALT improved, still slightly elevated and will recheck when in next for RV. Continue copaxone.  Pt to return call if questions.  He will relay information to her.

## 2019-06-17 ENCOUNTER — Telehealth: Payer: Self-pay | Admitting: Neurology

## 2019-06-17 NOTE — Telephone Encounter (Signed)
Pt is needing a refill on her Glatiramer Acetate 40 MG/ML SOSY sent in to the [facility]

## 2019-06-18 ENCOUNTER — Telehealth: Payer: Self-pay

## 2019-06-18 ENCOUNTER — Other Ambulatory Visit: Payer: Self-pay | Admitting: Pharmacist

## 2019-06-18 MED ORDER — GLATIRAMER ACETATE 40 MG/ML ~~LOC~~ SOSY: 40.0000 mg | PREFILLED_SYRINGE | SUBCUTANEOUS | 12 refills | Status: DC

## 2019-06-18 MED ORDER — GLATIRAMER ACETATE 40 MG/ML ~~LOC~~ SOSY
40.0000 mg | PREFILLED_SYRINGE | SUBCUTANEOUS | 12 refills | Status: DC
Start: 2019-06-19 — End: 2019-06-18

## 2019-06-18 NOTE — Addendum Note (Signed)
Addended by: Thomes Cake on: 06/18/2019 07:59 AM   Modules accepted: Orders

## 2019-06-18 NOTE — Telephone Encounter (Signed)
Pending approval for Glatiramer Acetate mg Key: [id number] ICD 10 code: G35   I will update once a decision has been made.

## 2019-06-24 DIAGNOSIS — Z23 Encounter for immunization: Secondary | ICD-10-CM | POA: Diagnosis not present

## 2019-06-24 NOTE — Telephone Encounter (Addendum)
Received approval for glatiramer acetate 40mg /ml.  Maximum of 12 refills 06-18-19 to 06-16-20.  13 styringes/30days.  [phone].  Fax confirmation received [facility] outpt pharmacy  [phone].

## 2019-07-23 ENCOUNTER — Telehealth: Payer: Self-pay | Admitting: Neurology

## 2019-07-23 NOTE — Telephone Encounter (Signed)
Dawn with cover my med called for a prior auth in regards to the Glatiramer Acetate 40 MG/ML SOSY Please follow up

## 2019-07-24 NOTE — Telephone Encounter (Signed)
See note from 06-18-19 noted approval for glatiramer acetate.

## 2019-07-24 NOTE — Telephone Encounter (Signed)
Spoke to [place] with CMM and relayed that this was approved.  They will close out the request on there end.

## 2019-08-06 ENCOUNTER — Encounter: Payer: Self-pay | Admitting: Neurology

## 2019-08-06 DIAGNOSIS — H40013 Open angle with borderline findings, low risk, bilateral: Secondary | ICD-10-CM | POA: Diagnosis not present

## 2019-08-06 DIAGNOSIS — H04123 Dry eye syndrome of bilateral lacrimal glands: Secondary | ICD-10-CM | POA: Diagnosis not present

## 2019-08-06 DIAGNOSIS — H524 Presbyopia: Secondary | ICD-10-CM | POA: Diagnosis not present

## 2019-08-06 DIAGNOSIS — H25013 Cortical age-related cataract, bilateral: Secondary | ICD-10-CM | POA: Diagnosis not present

## 2019-08-06 DIAGNOSIS — H2513 Age-related nuclear cataract, bilateral: Secondary | ICD-10-CM | POA: Diagnosis not present

## 2019-09-16 DIAGNOSIS — H04123 Dry eye syndrome of bilateral lacrimal glands: Secondary | ICD-10-CM | POA: Diagnosis not present

## 2019-09-16 DIAGNOSIS — H15122 Nodular episcleritis, left eye: Secondary | ICD-10-CM | POA: Diagnosis not present

## 2019-09-23 DIAGNOSIS — H15122 Nodular episcleritis, left eye: Secondary | ICD-10-CM | POA: Diagnosis not present

## 2019-09-23 DIAGNOSIS — H04123 Dry eye syndrome of bilateral lacrimal glands: Secondary | ICD-10-CM | POA: Diagnosis not present

## 2019-09-25 DIAGNOSIS — H1131 Conjunctival hemorrhage, right eye: Secondary | ICD-10-CM | POA: Diagnosis not present

## 2019-09-25 DIAGNOSIS — H15122 Nodular episcleritis, left eye: Secondary | ICD-10-CM | POA: Diagnosis not present

## 2019-09-25 DIAGNOSIS — H04123 Dry eye syndrome of bilateral lacrimal glands: Secondary | ICD-10-CM | POA: Diagnosis not present

## 2019-10-25 ENCOUNTER — Telehealth: Payer: Self-pay | Admitting: Pharmacist

## 2019-10-25 ENCOUNTER — Other Ambulatory Visit: Payer: Self-pay

## 2019-10-25 ENCOUNTER — Ambulatory Visit (HOSPITAL_BASED_OUTPATIENT_CLINIC_OR_DEPARTMENT_OTHER): Payer: 59 | Admitting: Pharmacist

## 2019-10-25 DIAGNOSIS — Z79899 Other long term (current) drug therapy: Secondary | ICD-10-CM

## 2019-10-25 NOTE — Progress Notes (Signed)
S: Patient contacted today for follow up with the [facility].  Patient is currently taking Copaxone for MS. Patient is managed by Dr. Jannifer Franklin for this. She reports that she is doing well with the Copaxone.   Adherence: denies any missed doses  Dosing: Copaxone 40 mg 3 times weekly  Dose adjustments: Hepatic: none needed  Drug-drug interactions: none  Monitoring: CBC: WNL LFTs: mildly elevated; followed by Dr. Jannifer Franklin' office. Ok'd her to continue based off of labwork done in Sep, 2020.  Pain/weakness: denies GI upset: denies Hypersensitivity: reports small injection site reaction/redness upon injecting but this is normal for her.   O:    Lab Results  Component Value Date   WBC 4.8 04/23/2019   HGB 14.6 04/23/2019   HCT 42.2 04/23/2019   MCV 87 04/23/2019   PLT 243 04/23/2019      Chemistry      Component Value Date/Time   NA 142 05/21/2019 0937   K 4.6 05/21/2019 0937   CL 103 05/21/2019 0937   CO2 25 05/21/2019 0937   BUN 15 05/21/2019 0937   CREATININE 1.22 (H) 05/21/2019 0937      Component Value Date/Time   CALCIUM 9.7 05/21/2019 0937   ALKPHOS 90 05/21/2019 0937   AST 31 05/21/2019 0937   ALT 36 (H) 05/21/2019 0937   BILITOT 0.3 05/21/2019 0937       A/P: 1. Medication review: Patient on Copaxone for MS and is tolerating the medication well. LFTs have normalized. Provided education on medication including adverse effects, such as GI upset (nausea) and hypersensitivity reactions. No recommendations for any changes. Patient to follow up with Dr. Jannifer Franklin as directed.    Christella Hartigan, PharmD, BCPS, BCACP, CPP Clinical Pharmacist Practitioner  [phone]

## 2019-10-25 NOTE — Telephone Encounter (Signed)
Called patient to schedule an appointment for the [facility]. I was unable to reach the patient so I left a HIPAA-compliant message requesting that the patient return my call.   

## 2019-10-31 ENCOUNTER — Encounter: Payer: Self-pay | Admitting: Neurology

## 2019-10-31 ENCOUNTER — Other Ambulatory Visit: Payer: Self-pay

## 2019-10-31 ENCOUNTER — Ambulatory Visit: Payer: 59 | Admitting: Neurology

## 2019-10-31 VITALS — BP 127/82 | HR 91 | Temp 97.8°F | Ht 67.0 in | Wt 181.2 lb

## 2019-10-31 DIAGNOSIS — G35 Multiple sclerosis: Secondary | ICD-10-CM | POA: Diagnosis not present

## 2019-10-31 MED ORDER — HYDROXYZINE PAMOATE 25 MG PO CAPS: ORAL_CAPSULE | ORAL | 5 refills | Status: DC

## 2019-10-31 NOTE — Patient Instructions (Signed)
It was great to see you today! I will check CMP today  Continue taking Copaxone See you back in 6 months with Dr. Jannifer Franklin

## 2019-10-31 NOTE — Progress Notes (Signed)
PATIENT: Katherine Yu DOB: 1951/07/19  REASON FOR VISIT: follow up HISTORY FROM: patient  HISTORY OF PRESENT ILLNESS: Today 10/31/19  Katherine Yu is a 69 year old female with history of multiple sclerosis.  As result she has mild gait instability.  She uses a cane for ambulation.  She has been switched from Rebif to Copaxone secondary to elevation of liver enzymes.  She has had side effect of injection site reaction and some flulike symptoms with Copaxone.  MRI of the brain and cervical spine in February 2019 showed good stability compared to 3 years prior.  There is no disease in the spinal cord.  She takes Relpax as needed for headache.  Her symptoms remain under good control.  She has some chronic issues of being forgetful, difficulty organizing, and balance issues.  She may fall once a month.  Her headaches are well controlled with Relpax.  At times she may drop things with her hands, or have trouble lifting her legs.  Overall, she has remained stable.  She lives with her husband, drives a car. She wonders if she may be able to come off MS medication as she ages.  She takes hydroxyzine for itching.  HISTORY 04/23/2019 SS: Katherine Yu is a 69 year old female with history of multiple sclerosis.  As result, she has issues with mild gait instability.  She uses a cane for ambulation.  She has been switched off Rebif to Copaxone secondary to elevation in liver enzymes.  She has had side effect of injection site reaction and some flulike symptoms with Copaxone.  She also had MRI of the brain and cervical spine in February 2019, showed good stability of MS lesions from prior study 3 years ago.  There is no disease in the spinal cord.  She reports she is tolerating Copaxone well.  She denies any new numbness or weakness in her arms or legs.  She denies any changes to her bowels, bladder, or vision.  She lives with her husband.  She reports she has had a few falls since last seen, but does well if  she uses her cane, and takes her time.  She enjoys reading and spending time outside.  She remains on Relpax for acute headache treatment.  She presents today for follow-up unaccompanied.  REVIEW OF SYSTEMS: Out of a complete 14 system review of symptoms, the patient complains only of the following symptoms, and all other reviewed systems are negative.  Gait abnormality  ALLERGIES: Allergies  Allergen Reactions  . Niacin Anaphylaxis, Hives and Itching    HOME MEDICATIONS: Outpatient Medications Prior to Visit  Medication Sig Dispense Refill  . aspirin 325 MG tablet Take 325 mg by mouth daily.    . benazepril (LOTENSIN) 40 MG tablet Take 40 mg by mouth daily.   4  . Cholecalciferol (VITAMIN D3) 5000 UNITS TABS Take 5,000 Units by mouth daily.    Marland Kitchen eletriptan (RELPAX) 40 MG tablet TAKE 1 TABLET (40 MG TOTAL) BY MOUTH AS NEEDED FOR MIGRAINE. MAY REPEAT IN 2 HOURS IF HEADACHE PERSISTS OR RECURS. 24 tablet 5  . famotidine (PEPCID) 20 MG tablet Take 20 mg by mouth daily.    Marland Kitchen Glatiramer Acetate 40 MG/ML SOSY Inject 40 mg into the skin 3 (three) times a week. 12 mL 12  . hydrochlorothiazide (HYDRODIURIL) 50 MG tablet Take 25 mg by mouth daily.   0  . hydrOXYzine (VISTARIL) 25 MG capsule TAKE 1 CAPSULE BY MOUTH EVERY 6 HOURS AS NEEDED. 30 capsule  5  . acetaminophen (TYLENOL) 650 MG CR tablet Take 650 mg by mouth every 8 (eight) hours as needed for pain.    Marland Kitchen ibuprofen (ADVIL) 100 MG/5ML suspension Take 200 mg by mouth every 4 (four) hours as needed. As needed otc.     No facility-administered medications prior to visit.    PAST MEDICAL HISTORY: Past Medical History:  Diagnosis Date  . Chronic insomnia   . Degenerative arthritis   . Dyslipidemia   . History of lower leg fracture   . History of peptic ulcer   . History of seizures    In childhood  . History of skull fracture   . Hypertension   . Migraine   . Mild obesity   . MS (multiple sclerosis) ([place])   . Plantar fasciitis   .  Rosacea   . Temporomandibular joint disease   . Tubal pregnancy     PAST SURGICAL HISTORY: Past Surgical History:  Procedure Laterality Date  . APPENDECTOMY    . GALLBLADDER SURGERY    . metal plate resection     skull  . pilonidal cyst      FAMILY HISTORY: Family History  Adopted: Yes  Problem Relation Age of Onset  . Lupus Sister   . Cancer Sister   . Heart attack Sister   . Heart attack Sister   . Breast cancer Neg Hx     SOCIAL HISTORY: Social History   Socioeconomic History  . Marital status: Married    Spouse name: Not on file  . Number of children: 3  . Years of education: Masters  . Highest education level: Not on file  Occupational History  . Occupation: Equities trader  Tobacco Use  . Smoking status: Never Smoker  . Smokeless tobacco: Never Used  Substance and Sexual Activity  . Alcohol use: No  . Drug use: No  . Sexual activity: Not on file  Other Topics Concern  . Not on file  Social History Narrative   Patient is right handed.   Patient drinks 1 cup caffeine daily.   Social Determinants of Health   Financial Resource Strain:   . Difficulty of Paying Living Expenses: Not on file  Food Insecurity:   . Worried About Charity fundraiser in the Last Year: Not on file  . Ran Out of Food in the Last Year: Not on file  Transportation Needs:   . Lack of Transportation (Medical): Not on file  . Lack of Transportation (Non-Medical): Not on file  Physical Activity:   . Days of Exercise per Week: Not on file  . Minutes of Exercise per Session: Not on file  Stress:   . Feeling of Stress : Not on file  Social Connections:   . Frequency of Communication with Friends and Family: Not on file  . Frequency of Social Gatherings with Friends and Family: Not on file  . Attends Religious Services: Not on file  . Active Member of Clubs or Organizations: Not on file  . Attends Archivist Meetings: Not on file  . Marital Status: Not on file    Intimate Partner Violence:   . Fear of Current or Ex-Partner: Not on file  . Emotionally Abused: Not on file  . Physically Abused: Not on file  . Sexually Abused: Not on file   PHYSICAL EXAM  Vitals:   10/31/19 1035  BP: 127/82  Pulse: 91  Temp: 97.8 F (36.6 C)  Weight: 181 lb 3.2 oz (82.2 kg)  Height: 5\' 7"  (1.702 m)   Body mass index is 28.38 kg/m.  Generalized: Well developed, in no acute distress   Neurological examination  Mentation: Alert oriented to time, place, history taking. Follows all commands speech and language fluent Cranial nerve II-XII: Pupils were equal round reactive to light. Extraocular movements were full, visual field were full on confrontational test. Facial sensation and strength were normal. Head turning and shoulder shrug were normal and symmetric. Motor: Good strength of all extremities Sensory: Sensory testing is intact to soft touch on all 4 extremities. No evidence of extinction is noted.  Coordination: Cerebellar testing reveals good finger-nose-finger and heel-to-shin bilaterally.  Gait and station: Able to rise from seated position without pushoff.  Gait is steady, normal uses a cane.  Tandem gait is unsteady, Romberg is positive, tends to fall backwards Reflexes: Deep tendon reflexes are symmetric   DIAGNOSTIC DATA (LABS, IMAGING, TESTING) - I reviewed patient records, labs, notes, testing and imaging myself where available.  Lab Results  Component Value Date   WBC 4.8 04/23/2019   HGB 14.6 04/23/2019   HCT 42.2 04/23/2019   MCV 87 04/23/2019   PLT 243 04/23/2019      Component Value Date/Time   NA 142 05/21/2019 0937   K 4.6 05/21/2019 0937   CL 103 05/21/2019 0937   CO2 25 05/21/2019 0937   GLUCOSE 128 (H) 05/21/2019 0937   GLUCOSE 115 (H) 08/24/2010 1003   BUN 15 05/21/2019 0937   CREATININE 1.22 (H) 05/21/2019 0937   CALCIUM 9.7 05/21/2019 0937   PROT 6.8 05/21/2019 0937   ALBUMIN 4.4 05/21/2019 0937   AST 31 05/21/2019  0937   ALT 36 (H) 05/21/2019 0937   ALKPHOS 90 05/21/2019 0937   BILITOT 0.3 05/21/2019 0937   GFRNONAA 46 (L) 05/21/2019 0937   GFRAA 53 (L) 05/21/2019 0937   Lab Results  Component Value Date   CHOL 171 01/17/2008   HDL 36.9 (L) 01/17/2008   LDLCALC 107 (H) 01/17/2008   TRIG 136 01/17/2008   CHOLHDL 4.6 CALC 01/17/2008   No results found for: HGBA1C No results found for: VITAMINB12 No results found for: TSH    ASSESSMENT AND PLAN 68 y.o. year old female  has a past medical history of Chronic insomnia, Degenerative arthritis, Dyslipidemia, History of lower leg fracture, History of peptic ulcer, History of seizures, History of skull fracture, Hypertension, Migraine, Mild obesity, MS (multiple sclerosis) ([facility]), Plantar fasciitis, Rosacea, Temporomandibular joint disease, and Tubal pregnancy. here with:  1.  Multiple sclerosis 2.  Mild gait disorder 3.  History of elevated liver enzymes  I will check a CMP while on Copaxone.  Previously she had to be switched off Rebif to Copaxone due to elevated liver enzymes.  She questions whether or not she may be able to come off the Copaxone, as she ages.  She does not wish to be on MS medications long-term.  I will have her follow-up in 6 months with Dr. Jannifer Franklin, to discuss whether she may be able to come off MS medication all together.  I will refill her hydroxyzine that she takes for itching as needed.   I spent 15 minutes with the patient. 50% of this time was spent discussing her plan of care.   Butler Denmark, AGNP-C, DNP 10/31/2019, 11:05 AM [facility] [address] [place], [place] [address] [phone]

## 2019-11-01 LAB — COMPREHENSIVE METABOLIC PANEL
ALT: 35 IU/L — ABNORMAL HIGH (ref 0–32)
AST: 28 IU/L (ref 0–40)
Albumin/Globulin Ratio: 1.7 (ref 1.2–2.2)
Albumin: 5 g/dL — ABNORMAL HIGH (ref 3.8–4.8)
Alkaline Phosphatase: 95 IU/L (ref 39–117)
BUN/Creatinine Ratio: 22 (ref 12–28)
BUN: 21 mg/dL (ref 8–27)
Bilirubin Total: 0.3 mg/dL (ref 0.0–1.2)
CO2: 18 mmol/L — ABNORMAL LOW (ref 20–29)
Calcium: 10.4 mg/dL — ABNORMAL HIGH (ref 8.7–10.3)
Chloride: 101 mmol/L (ref 96–106)
Creatinine, Ser: 0.94 mg/dL (ref 0.57–1.00)
GFR calc Af Amer: 73 mL/min/{1.73_m2} (ref >59)
GFR calc non Af Amer: 63 mL/min/{1.73_m2} (ref >59)
Globulin, Total: 2.9 g/dL (ref 1.5–4.5)
Glucose: 113 mg/dL — ABNORMAL HIGH (ref 65–99)
Potassium: 4.9 mmol/L (ref 3.5–5.2)
Sodium: 142 mmol/L (ref 134–144)
Total Protein: 7.9 g/dL (ref 6.0–8.5)

## 2019-11-01 NOTE — Progress Notes (Signed)
I have read the note, and I agree with the clinical assessment and plan.  Nima Bamburg K Johnathon Mittal   

## 2019-11-05 ENCOUNTER — Telehealth: Payer: Self-pay

## 2019-11-05 NOTE — Telephone Encounter (Signed)
Called to go over labs. NALVM. Please relay results. (see Mychart message)

## 2019-11-07 ENCOUNTER — Telehealth: Payer: Self-pay | Admitting: Neurology

## 2019-11-07 ENCOUNTER — Telehealth: Payer: Self-pay

## 2019-11-07 NOTE — Telephone Encounter (Signed)
This patient was seen by Dr. Marshall Cork on 06 August 2019.  The patient was found to have borderline glaucoma findings.  Early cataracts were seen.  The patient is on Copaxone.

## 2019-11-07 NOTE — Telephone Encounter (Signed)
-----   Message from [name], NP sent at 11/04/2019 12:40 PM EST ----- Please call the patient.  CMP shows very mildly elevated calcium 10.4 for unknown reason, has not been elevated previously, can mention to PCP, mildly elevated ALT.  Continue with current treatment.

## 2019-11-07 NOTE — Telephone Encounter (Signed)
Spoke with the patient and she verbalized understanding her results. No questions or concerns at this time.   

## 2019-11-08 ENCOUNTER — Encounter: Payer: Self-pay | Admitting: Neurology

## 2019-11-11 ENCOUNTER — Other Ambulatory Visit: Payer: Self-pay | Admitting: Neurology

## 2019-11-11 NOTE — Progress Notes (Signed)
I have updated her med list to reflect as needed aspirin per her my chart message.

## 2019-11-12 ENCOUNTER — Telehealth: Payer: Self-pay

## 2019-11-12 NOTE — Telephone Encounter (Signed)
Corrected ASA to PRN and not QD per pt request. Please MYChart message.

## 2019-11-15 ENCOUNTER — Ambulatory Visit: Payer: 59 | Attending: Internal Medicine

## 2019-11-15 DIAGNOSIS — Z23 Encounter for immunization: Secondary | ICD-10-CM

## 2019-11-15 NOTE — Progress Notes (Signed)
   Covid-19 Vaccination Clinic  Name:  Katherine Yu    MRN: [id number] DOB: December 23, 1950  11/15/2019  [name] was observed post Covid-19 immunization for 30 minutes based on pre-vaccination screening without incident. She was provided with Vaccine Information Sheet and instruction to access the V-Safe system.   [name] was instructed to call 911 with any severe reactions post vaccine: Marland Kitchen Difficulty breathing  . Swelling of face and throat  . A fast heartbeat  . A bad rash all over body  . Dizziness and weakness   Immunizations Administered    Name Date Dose VIS Date Route   Pfizer COVID-19 Vaccine 11/15/2019  9:20 AM 0.3 mL 08/16/2019 Intramuscular   Manufacturer: [place]   Lot: [id number]   [place]: [id number]

## 2019-12-06 ENCOUNTER — Other Ambulatory Visit: Payer: Self-pay | Admitting: Neurology

## 2019-12-09 ENCOUNTER — Ambulatory Visit: Payer: 59 | Attending: Internal Medicine

## 2019-12-09 DIAGNOSIS — Z23 Encounter for immunization: Secondary | ICD-10-CM

## 2019-12-09 NOTE — Progress Notes (Addendum)
   Covid-19 Vaccination Clinic  Name:  Talley Polachek    MRN: [id number] DOB: [birth year]  12/09/2019  Ms. Kohlenberg was observed post Covid-19 immunization for 30 minutes without incident. She was provided with Vaccine Information Sheet and instruction to access the V-Safe system.   Ms. Kaska was instructed to call 911 with any severe reactions post vaccine: Marland Kitchen Difficulty breathing  . Swelling of face and throat  . A fast heartbeat  . A bad rash all over body  . Dizziness and weakness   Immunizations Administered    Name Date Dose VIS Date Route   Pfizer COVID-19 Vaccine 12/09/2019 12:06 PM 0.3 mL 08/16/2019 Intramuscular   Manufacturer: [place]   Lot: [id number]   [place]: [id number]

## 2020-01-18 ENCOUNTER — Other Ambulatory Visit: Payer: Self-pay

## 2020-01-18 ENCOUNTER — Ambulatory Visit
Admission: EM | Admit: 2020-01-18 | Discharge: 2020-01-18 | Disposition: A | Discharge status: Home / Self Care | Payer: 59

## 2020-01-18 ENCOUNTER — Encounter: Payer: Self-pay | Admitting: Physician Assistant

## 2020-01-18 DIAGNOSIS — R222 Localized swelling, mass and lump, trunk: Secondary | ICD-10-CM

## 2020-01-18 NOTE — ED Triage Notes (Signed)
Patient is here for evaluation of abdominal mass that she noticed last night. She is unaware of timeframe that it developed and denies pain.

## 2020-01-18 NOTE — Discharge Instructions (Signed)
Warm compress given overlying bruising. Follow up with PCP for further evaluation and management needed. If having abdominal pain, vomiting, not passing gas, go to the ED for further evaluation.

## 2020-01-18 NOTE — ED Provider Notes (Signed)
[facility]    CSN: [id number] Arrival date & time: 01/18/20  [id number]      History   Chief Complaint Chief Complaint  Patient presents with  . Abdominal mass    HPI Katherine Yu is a 69 y.o. female.   69 year old female comes in for abdominal mass.  States had some itching to the abdomen, and incidentally found the mass yesterday.  No erythema, swelling, pain.  She has baseline nausea in the mornings due to MS without much changes.  Denies abdominal pain, still passing gas and moving bowels.  S/p appendectomy, cholecystectomy.     Past Medical History:  Diagnosis Date  . Chronic insomnia   . Degenerative arthritis   . Dyslipidemia   . History of lower leg fracture   . History of peptic ulcer   . History of seizures    In childhood  . History of skull fracture   . Hypertension   . Migraine   . Mild obesity   . MS (multiple sclerosis) ([place])   . Plantar fasciitis   . Rosacea   . Temporomandibular joint disease   . Tubal pregnancy     Patient Active Problem List   Diagnosis Date Noted  . Common migraine 10/15/2014  . HYPERLIPIDEMIA-MIXED 05/04/2010  . PERS HX NONCOMPLIANCE W/MED TX PRS HAZARDS HLTH 05/04/2010  . CHEST PAIN-UNSPECIFIED 03/02/2009  . CHEST PAIN-PRECORDIAL 03/02/2009  . DEPRESSION 02/20/2009  . MULTIPLE SCLEROSIS 02/20/2009  . HYPERTENSION, UNSPECIFIED 02/20/2009  . GERD 02/20/2009  . ARTHRITIS 02/20/2009  . DEGENERATIVE DISC DISEASE 02/20/2009  . FATIGUE 02/20/2009  . DYSPNEA ON EXERTION 02/20/2009  . FREQUENCY, URINARY 02/20/2009    Past Surgical History:  Procedure Laterality Date  . APPENDECTOMY    . GALLBLADDER SURGERY    . metal plate resection     skull  . pilonidal cyst      OB History   No obstetric history on file.      Home Medications    Prior to Admission medications   Medication Sig Start Date End Date Taking? Authorizing Provider  aspirin 325 MG tablet Take 325 mg by mouth as needed.     [provider]  benazepril (LOTENSIN) 40 MG tablet Take 40 mg by mouth daily.  08/04/15   [provider]  Cholecalciferol (VITAMIN D3) 5000 UNITS TABS Take 5,000 Units by mouth daily.    [provider]  eletriptan (RELPAX) 40 MG tablet TAKE 1 TABLET (40 MG TOTAL) BY MOUTH AS NEEDED FOR MIGRAINE. MAY REPEAT IN 2 HOURS IF HEADACHE PERSISTS OR RECURS. 12/10/19   [name], NP  famotidine (PEPCID) 20 MG tablet Take 20 mg by mouth daily.    [provider]  Glatiramer Acetate 40 MG/ML SOSY Inject 40 mg into the skin 3 (three) times a week. 06/19/19   Tresa Garter, MD  hydrochlorothiazide (HYDRODIURIL) 50 MG tablet Take 25 mg by mouth daily.  09/22/15   [provider]  hydrOXYzine (VISTARIL) 25 MG capsule 1 capsules every 6 hours as needed 10/31/19   [name], NP    Family History Family History  Adopted: Yes  Problem Relation Age of Onset  . Lupus Sister   . Cancer Sister   . Heart attack Sister   . Heart attack Sister   . Breast cancer Neg Hx     Social History Social History   Tobacco Use  . Smoking status: Never Smoker  . Smokeless tobacco:  Never Used  Substance Use Topics  . Alcohol use: No  . Drug use: No     Allergies   Niacin   Review of Systems Review of Systems  Reason unable to perform ROS: See HPI as above.     Physical Exam Triage Vital Signs ED Triage Vitals [01/18/20 0846]  Enc Vitals Group     BP (!) 157/89     Pulse Rate 93     Resp 18     Temp 98.3 F (36.8 C)     Temp Source Oral     SpO2 96 %     Weight      Height      Head Circumference      Peak Flow      Pain Score      Pain Loc      Pain Edu?      Excl. in [place]?    No data found.  Updated Vital Signs BP (!) 157/89 (BP Location: Left Arm)   Pulse 93   Temp 98.3 F (36.8 C) (Oral)   Resp 18   SpO2 96%   Visual Acuity Right Eye Distance:   Left Eye Distance:   Bilateral Distance:    Right Eye Near:   Left Eye  Near:    Bilateral Near:     Physical Exam Constitutional:      General: She is not in acute distress.    Appearance: Normal appearance. She is well-developed. She is not toxic-appearing or diaphoretic.  HENT:     Head: Normocephalic and atraumatic.  Eyes:     Conjunctiva/sclera: Conjunctivae normal.     Pupils: Pupils are equal, round, and reactive to light.  Pulmonary:     Effort: Pulmonary effort is normal. No respiratory distress.     Comments: Speaking in full sentences without difficulty Abdominal:     Comments: Linear surgical scars to the RUQ and RLQ due to past surgery. Slight contusion to the LLQ with 3cm x 1cm hard mobile lesion, nontender. No hernia felt.   Abdomen soft, +BS, nontender to palpation.   Musculoskeletal:     Cervical back: Normal range of motion and neck supple.  Skin:    General: Skin is warm and dry.  Neurological:     Mental Status: She is alert and oriented to person, place, and time.      UC Treatments / Results  Labs (all labs ordered are listed, but only abnormal results are displayed) Labs Reviewed - No data to display  EKG   Radiology No results found.  Procedures Procedures (including critical care time)  Medications Ordered in UC Medications - No data to display  Initial Impression / Assessment and Plan / UC Course  I have reviewed the triage vital signs and the nursing notes.  Pertinent labs & imaging results that were available during my care of the patient were reviewed by me and considered in my medical decision making (see chart for details).    No emergent situations at this time. Due to slight contusion, ?mass from injury, will have patient do warm compress for now. To follow up with PCP for further evaluation and management needed.  Final Clinical Impressions(s) / UC Diagnoses   Final diagnoses:  Abdominal wall mass   ED Prescriptions    None     PDMP not reviewed this encounter.   Ok Edwards, PA-C 01/18/20  1234

## 2020-01-20 DIAGNOSIS — I10 Essential (primary) hypertension: Secondary | ICD-10-CM | POA: Diagnosis not present

## 2020-01-22 ENCOUNTER — Encounter: Payer: Self-pay | Admitting: Cardiology

## 2020-01-22 ENCOUNTER — Other Ambulatory Visit: Payer: Self-pay | Admitting: Family Medicine

## 2020-01-22 ENCOUNTER — Ambulatory Visit
Admission: RE | Admit: 2020-01-22 | Discharge: 2020-01-22 | Disposition: A | Discharge status: Home / Self Care | Payer: 59 | Source: Ambulatory Visit | Attending: Family Medicine | Admitting: Family Medicine

## 2020-01-22 DIAGNOSIS — M7989 Other specified soft tissue disorders: Secondary | ICD-10-CM

## 2020-01-22 DIAGNOSIS — R079 Chest pain, unspecified: Secondary | ICD-10-CM | POA: Diagnosis not present

## 2020-01-22 DIAGNOSIS — I10 Essential (primary) hypertension: Secondary | ICD-10-CM | POA: Diagnosis not present

## 2020-01-22 DIAGNOSIS — Z0000 Encounter for general adult medical examination without abnormal findings: Secondary | ICD-10-CM | POA: Diagnosis not present

## 2020-01-22 DIAGNOSIS — R1909 Other intra-abdominal and pelvic swelling, mass and lump: Secondary | ICD-10-CM | POA: Diagnosis not present

## 2020-01-27 ENCOUNTER — Telehealth: Payer: Self-pay

## 2020-01-27 NOTE — Telephone Encounter (Signed)
NOTES ON FILE FROM [facility] [phone] SENT REFERRAL TO SCHEDULING

## 2020-01-29 ENCOUNTER — Telehealth: Payer: Self-pay | Admitting: Neurology

## 2020-01-29 NOTE — Telephone Encounter (Signed)
I received blood work collected on Jan 13, 2020, glucose 142, creatinine 1.11, alkaline phos 94, AST 27, ALT slightly high at 40.  Liver function is overall stable. She is on Copaxone for MS.

## 2020-02-08 ENCOUNTER — Encounter: Payer: Self-pay | Admitting: Cardiology

## 2020-02-08 NOTE — Progress Notes (Deleted)
Cardiology Office Note   Date:  02/08/2020   ID:  Katherine Yu, DOB 1951-02-08, MRN [id number]  PCP:  Leonard Downing, MD  Cardiologist:   No primary care provider on file. Referring:  ***  No chief complaint on file.     History of Present Illness: Katherine Yu Katherine Yu is a 69 y.o. female who is referred by Leonard Downing, MD for evaluation of chest pain.  ***    Past Medical History:  Diagnosis Date  . ARTHRITIS 02/20/2009   Qualifier: Diagnosis of  By: Ronne Binning    . CHEST PAIN-PRECORDIAL 03/02/2009   Qualifier: Diagnosis of  By: Verl Blalock, MD, Delanna Ahmadi CHEST PAIN-UNSPECIFIED 03/02/2009   Qualifier: Diagnosis of  By: Verl Blalock, MD, Delanna Ahmadi Chronic insomnia   . Common migraine 10/15/2014  . Degenerative arthritis   . DEPRESSION 02/20/2009   Qualifier: Diagnosis of  By: Ronne Binning    . Dyslipidemia   . DYSPNEA ON EXERTION 02/20/2009   Qualifier: Diagnosis of  By: Orville Govern CMA, Arbie Cookey    . FATIGUE 02/20/2009   Qualifier: Diagnosis of  By: Ronne Binning    . FREQUENCY, URINARY 02/20/2009   Qualifier: Diagnosis of  By: Orville Govern CMA, Carol    . GERD 02/20/2009   Qualifier: Diagnosis of  By: Ronne Binning    . History of lower leg fracture   . History of peptic ulcer   . History of seizures    In childhood  . History of skull fracture   . HYPERLIPIDEMIA-MIXED 05/04/2010   Qualifier: Diagnosis of  By: Verl Blalock, MD, Delanna Ahmadi Hypertension   . HYPERTENSION, UNSPECIFIED 02/20/2009   Qualifier: Diagnosis of  By: Orville Govern CMA, Carol    . Migraine   . Mild obesity   . MS (multiple sclerosis) ([place])   . MULTIPLE SCLEROSIS 02/20/2009   Qualifier: Diagnosis of  By: Orville Govern CMA, Arbie Cookey    . PERS HX NONCOMPLIANCE W/MED TX PRS HAZARDS HLTH 05/04/2010   Qualifier: Diagnosis of  By: Verl Blalock, MD, FACC, Thomas C   . Plantar fasciitis   . Rosacea   . Temporomandibular joint disease   . Tubal pregnancy     Past Surgical History:    Procedure Laterality Date  . APPENDECTOMY    . GALLBLADDER SURGERY    . metal plate resection     skull  . pilonidal cyst       Current Outpatient Medications  Medication Sig Dispense Refill  . aspirin 325 MG tablet Take 325 mg by mouth as needed.    . benazepril (LOTENSIN) 40 MG tablet Take 40 mg by mouth daily.   4  . Cholecalciferol (VITAMIN D3) 5000 UNITS TABS Take 5,000 Units by mouth daily.    Marland Kitchen eletriptan (RELPAX) 40 MG tablet TAKE 1 TABLET (40 MG TOTAL) BY MOUTH AS NEEDED FOR MIGRAINE. MAY REPEAT IN 2 HOURS IF HEADACHE PERSISTS OR RECURS. 24 tablet 5  . famotidine (PEPCID) 20 MG tablet Take 20 mg by mouth daily.    Marland Kitchen Glatiramer Acetate 40 MG/ML SOSY Inject 40 mg into the skin 3 (three) times a week. 12 mL 12  . hydrochlorothiazide (HYDRODIURIL) 50 MG tablet Take 25 mg by mouth daily.   0  . hydrOXYzine (VISTARIL) 25 MG capsule 1 capsules every 6 hours as needed 30 capsule 5   No current facility-administered medications for this visit.  Allergies:   Niacin    Social History:  The patient  reports that she has never smoked. She has never used smokeless tobacco. She reports that she does not drink alcohol or use drugs.   Family History:  The patient's ***family history includes Cancer in her sister; Heart attack in her sister and sister; Lupus in her sister. She was adopted.    ROS:  Please see the history of present illness.   Otherwise, review of systems are positive for {NONE DEFAULTED:18576::"none"}.   All other systems are reviewed and negative.    PHYSICAL EXAM: VS:  There were no vitals taken for this visit. , BMI There is no height or weight on file to calculate BMI. GENERAL:  Well appearing HEENT:  Pupils equal round and reactive, fundi not visualized, oral mucosa unremarkable NECK:  No jugular venous distention, waveform within normal limits, carotid upstroke brisk and symmetric, no bruits, no thyromegaly LYMPHATICS:  No cervical, inguinal adenopathy LUNGS:   Clear to auscultation bilaterally BACK:  No CVA tenderness CHEST:  Unremarkable HEART:  PMI not displaced or sustained,S1 and S2 within normal limits, no S3, no S4, no clicks, no rubs, *** murmurs ABD:  Flat, positive bowel sounds normal in frequency in pitch, no bruits, no rebound, no guarding, no midline pulsatile mass, no hepatomegaly, no splenomegaly EXT:  2 plus pulses throughout, no edema, no cyanosis no clubbing SKIN:  No rashes no nodules NEURO:  Cranial nerves II through XII grossly intact, motor grossly intact throughout PSYCH:  Cognitively intact, oriented to person place and time    EKG:  EKG {ACTION; IS/IS YKD:98338250} ordered today. The ekg ordered today demonstrates ***   Recent Labs: 04/23/2019: Hemoglobin 14.6; Platelets 243 10/31/2019: ALT 35; BUN 21; Creatinine, Ser 0.94; Potassium 4.9; Sodium 142    Lipid Panel    Component Value Date/Time   CHOL 171 01/17/2008 1250   TRIG 136 01/17/2008 1250   HDL 36.9 (L) 01/17/2008 1250   CHOLHDL 4.6 CALC 01/17/2008 1250   VLDL 27 01/17/2008 1250   LDLCALC 107 (H) 01/17/2008 1250      Wt Readings from Last 3 Encounters:  10/31/19 181 lb 3.2 oz (82.2 kg)  04/23/19 181 lb 6.4 oz (82.3 kg)  10/23/18 177 lb (80.3 kg)      Other studies Reviewed: Additional studies/ records that were reviewed today include: ***. Review of the above records demonstrates:  Please see elsewhere in the note.  ***   ASSESSMENT AND PLAN:  CHEST PAIN:  ***  COVID EDUCATION:   ***   Current medicines are reviewed at length with the patient today.  The patient {ACTIONS; HAS/DOES NOT HAVE:19233} concerns regarding medicines.  The following changes have been made:  {PLAN; NO CHANGE:13088:s}  Labs/ tests ordered today include: *** No orders of the defined types were placed in this encounter.    Disposition:   FU with ***    Signed, Minus Breeding, MD  02/08/2020 11:55 AM    [facility]

## 2020-02-10 ENCOUNTER — Telehealth: Payer: Self-pay | Admitting: Neurology

## 2020-02-10 ENCOUNTER — Other Ambulatory Visit: Payer: Self-pay

## 2020-02-10 ENCOUNTER — Ambulatory Visit: Payer: 59 | Admitting: Cardiology

## 2020-02-10 ENCOUNTER — Other Ambulatory Visit: Payer: Self-pay | Admitting: Neurology

## 2020-02-10 ENCOUNTER — Encounter: Payer: Self-pay | Admitting: Cardiology

## 2020-02-10 ENCOUNTER — Encounter: Payer: Self-pay | Admitting: Neurology

## 2020-02-10 VITALS — BP 126/72 | HR 91 | Temp 97.7°F | Ht 66.5 in | Wt 180.8 lb

## 2020-02-10 DIAGNOSIS — E785 Hyperlipidemia, unspecified: Secondary | ICD-10-CM | POA: Diagnosis not present

## 2020-02-10 DIAGNOSIS — Z7189 Other specified counseling: Secondary | ICD-10-CM | POA: Diagnosis not present

## 2020-02-10 DIAGNOSIS — R072 Precordial pain: Secondary | ICD-10-CM | POA: Diagnosis not present

## 2020-02-10 MED ORDER — HYDROXYZINE HCL 25 MG PO TABS: 25.0000 mg | ORAL_TABLET | Freq: Four times a day (QID) | ORAL | 5 refills | Status: DC | PRN

## 2020-02-10 NOTE — Telephone Encounter (Signed)
Phone rep checked office voicemail's, at 3:59 [place] called(did not state her name) she stated that pt needs to be switched to tablet form of this medication.  The tech asked that they be called back at [phone]

## 2020-02-10 NOTE — Addendum Note (Signed)
Addended by: Brandon Melnick on: 02/10/2020 04:38 PM   Modules accepted: Orders

## 2020-02-10 NOTE — Addendum Note (Signed)
Addended by: [name] on: 02/10/2020 04:40 PM   Modules accepted: Orders

## 2020-02-10 NOTE — Patient Instructions (Signed)
Medication Instructions:  No changes *If you need a refill on your cardiac medications before your next appointment, please call your pharmacy*  Lab Work: Your physician recommends that you return for lab work when you return for your stress test (FASTING LIPIDS)  Testing/Procedures: Your physician has requested that you have an exercise tolerance test. For further information please visit [email or web address]. Please also follow instruction sheet, as given.  You will need a Covid Screening 3 days prior to your exercise tolerance test. You will need to self quarantine after the screening until your procedure. This is a Drive Up Visit at the [facility] [address] [place]. Someone will direct you to the appropriate testing line. Stay in your car and someone will be with you shortly  Follow-Up: At [facility] ([place] you and your health needs are our priority.  As part of our continuing mission to provide you with exceptional heart care, we have created designated Provider Care Teams.  These Care Teams include your primary Cardiologist (physician) and Advanced Practice Providers (APPs -  Physician Assistants and Nurse Practitioners) who all work together to provide you with the care you need, when you need it.  Your next appointment:   6 month(s)  You will receive a reminder letter in the mail two months in advance. If you don't receive a letter, please call our office to schedule the follow-up appointment.  The format for your next appointment:   In Person  Provider:   Minus Breeding, MD

## 2020-02-10 NOTE — Progress Notes (Signed)
Cardiology Office Note   Date:  02/10/2020   ID:  Katherine Yu, DOB Nov 15, 1950, MRN [id number]  PCP:  Leonard Downing, MD  Cardiologist:   No primary care provider on file. Referring:  Leonard Downing, MD  Chief Complaint  Patient presents with  . Chest Pain      History of Present Illness: Katherine Yu is a 69 y.o. female who is referred by Leonard Downing, MD for evaluation of chest pain.  The patient was seen in 2010 for chest discomfort and had a negative POET (Plain Old Exercise Treadmill) by her report although I do not have these results.  She has had discomfort around her chest that she is described to what she calls to the "MS hug".  However, she has been having some sharp discomfort in her right upper chest.  It stops her.  It happens 4-5 times per month.  Its been severe lasting for a couple of minutes up to 15 at a time.  She has to sit down.  It is increasing in frequency.  There is no radiation to her jaw into her arms.  She has not had this discomfort before.  She gets a little lightheaded with it but she is not describing associated nausea vomiting or diaphoresis.  She does not get short of breath.  It comes and goes spontaneously.  She is not overly active but she walks her dog Uno and cannot bring this on.   Past Medical History:  Diagnosis Date  . ARTHRITIS 02/20/2009   Qualifier: Diagnosis of  By: Ronne Binning    . CHEST PAIN-PRECORDIAL 03/02/2009   Qualifier: Diagnosis of  By: Verl Blalock, MD, Delanna Ahmadi Chronic insomnia   . Degenerative arthritis   . DEPRESSION 02/20/2009   Qualifier: Diagnosis of  By: Ronne Binning    . Dyslipidemia   . FATIGUE 02/20/2009   Qualifier: Diagnosis of  By: Ronne Binning    . GERD 02/20/2009   Qualifier: Diagnosis of  By: Ronne Binning    . History of lower leg fracture   . History of peptic ulcer   . History of seizures    In childhood  . HYPERLIPIDEMIA-MIXED 05/04/2010     Qualifier: Diagnosis of  By: Verl Blalock, MD, Delanna Ahmadi Hypertension   . Migraine   . Mild obesity   . MULTIPLE SCLEROSIS 02/20/2009   Qualifier: Diagnosis of  By: Orville Govern CMA, Arbie Cookey    . PERS HX NONCOMPLIANCE W/MED TX PRS HAZARDS HLTH 05/04/2010   Qualifier: Diagnosis of  By: Verl Blalock, MD, Delanna Ahmadi Temporomandibular joint disease     Past Surgical History:  Procedure Laterality Date  . APPENDECTOMY    . GALLBLADDER SURGERY    . metal plate resection     skull  . pilonidal cyst       Current Outpatient Medications  Medication Sig Dispense Refill  . aspirin 325 MG tablet Take 325 mg by mouth as needed.    . benazepril (LOTENSIN) 40 MG tablet Take 40 mg by mouth daily.   4  . Cholecalciferol (VITAMIN D3) 5000 UNITS TABS Take 5,000 Units by mouth daily.    Marland Kitchen eletriptan (RELPAX) 40 MG tablet TAKE 1 TABLET (40 MG TOTAL) BY MOUTH AS NEEDED FOR MIGRAINE. MAY REPEAT IN 2 HOURS IF HEADACHE PERSISTS OR RECURS. 24 tablet 5  . famotidine (PEPCID) 20 MG tablet  Take 20 mg by mouth daily.    Marland Kitchen Glatiramer Acetate 40 MG/ML SOSY Inject 40 mg into the skin 3 (three) times a week. 12 mL 12  . hydrochlorothiazide (HYDRODIURIL) 50 MG tablet Take 25 mg by mouth daily.   0  . hydrOXYzine (VISTARIL) 25 MG capsule 1 capsules every 6 hours as needed 30 capsule 5   No current facility-administered medications for this visit.    Allergies:   Niacin    Social History:  The patient  reports that she has never smoked. She has never used smokeless tobacco. She reports that she does not drink alcohol or use drugs.   Family History:  The patient's family history includes Cancer in her sister; Heart attack in her sister and sister; Lupus in her sister. She was adopted.    ROS:  Please see the history of present illness.   Otherwise, review of systems are positive for none.   All other systems are reviewed and negative.    PHYSICAL EXAM: VS:  BP 126/72   Pulse 91   Temp 97.7 F (36.5 C)   Ht 5'  6.5" (1.689 m)   Wt 180 lb 12.8 oz (82 kg)   SpO2 94%   BMI 28.74 kg/m  , BMI Body mass index is 28.74 kg/m. GENERAL:  Well appearing HEENT:  Pupils equal round and reactive, fundi not visualized, oral mucosa unremarkable NECK:  No jugular venous distention, waveform within normal limits, carotid upstroke brisk and symmetric, no bruits, no thyromegaly LYMPHATICS:  No cervical, inguinal adenopathy LUNGS:  Clear to auscultation bilaterally BACK:  No CVA tenderness CHEST:  Unremarkable HEART:  PMI not displaced or sustained,S1 and S2 within normal limits, no S3, no S4, no clicks, no rubs, no murmurs ABD:  Flat, positive bowel sounds normal in frequency in pitch, no bruits, no rebound, no guarding, no midline pulsatile mass, no hepatomegaly, no splenomegaly EXT:  2 plus pulses throughout, no edema, no cyanosis no clubbing SKIN:  No rashes no nodules NEURO:  Cranial nerves II through XII grossly intact, motor grossly intact throughout PSYCH:  Cognitively intact, oriented to person place and time    EKG:  EKG is ordered today. The ekg ordered today demonstrates sinus rhythm, rate 91, low voltage in the limb leads and chest leads, poor anterior R wave progression, baseline artifact, no acute ST-T wave changes.   Recent Labs: 04/23/2019: Hemoglobin 14.6; Platelets 243 10/31/2019: ALT 35; BUN 21; Creatinine, Ser 0.94; Potassium 4.9; Sodium 142    Lipid Panel    Component Value Date/Time   CHOL 171 01/17/2008 1250   TRIG 136 01/17/2008 1250   HDL 36.9 (L) 01/17/2008 1250   CHOLHDL 4.6 CALC 01/17/2008 1250   VLDL 27 01/17/2008 1250   LDLCALC 107 (H) 01/17/2008 1250      Wt Readings from Last 3 Encounters:  02/10/20 180 lb 12.8 oz (82 kg)  10/31/19 181 lb 3.2 oz (82.2 kg)  04/23/19 181 lb 6.4 oz (82.3 kg)      Other studies Reviewed: Additional studies/ records that were reviewed today include: None. Review of the above records demonstrates:  Please see elsewhere in the note.       ASSESSMENT AND PLAN:  CHEST PAIN:   Her chest pain is somewhat atypical.  She does not have significant cardiovascular risk factors though she has a vague family history as she is adopted.  She has a mildly abnormal EKG with low voltages but it does not appear to be  different than her most recent.  I am going to check a POET (Plain Old Exercise Treadmill).  Further evaluation will be based on these results.  If this is negative but she continues to have symptoms she knows to call me back and I will have a low threshold for echocardiography given the slightly low voltages.  HTN: Her blood pressure is well controlled.  No change in therapy.  RISK REDUCTION: She agrees to get a fasting lipid profile.  COVID EDUCATION:   She has had her vaccine.  Current medicines are reviewed at length with the patient today.  The patient does not have concerns regarding medicines.  The following changes have been made:  no change  Labs/ tests ordered today include:   Orders Placed This Encounter  Procedures  . Lipid panel  . EXERCISE TOLERANCE TEST (ETT)  . EKG 12-Lead     Disposition:   FU with me in six months.     Signed, Minus Breeding, MD  02/10/2020 9:34 AM    [facility]

## 2020-02-11 LAB — LIPID PANEL
Chol/HDL Ratio: 4.7 ratio — ABNORMAL HIGH (ref 0.0–4.4)
Cholesterol, Total: 203 mg/dL — ABNORMAL HIGH (ref 100–199)
HDL: 43 mg/dL (ref >39)
LDL Chol Calc (NIH): 137 mg/dL — ABNORMAL HIGH (ref 0–99)
Triglycerides: 129 mg/dL (ref 0–149)
VLDL Cholesterol Cal: 23 mg/dL (ref 5–40)

## 2020-02-12 ENCOUNTER — Other Ambulatory Visit: Payer: Self-pay | Admitting: Neurology

## 2020-02-12 MED ORDER — HYDROXYZINE HCL 25 MG PO TABS: 25.0000 mg | ORAL_TABLET | Freq: Four times a day (QID) | ORAL | 5 refills | Status: DC | PRN

## 2020-02-14 ENCOUNTER — Telehealth (HOSPITAL_COMMUNITY): Payer: Self-pay

## 2020-02-14 NOTE — Telephone Encounter (Signed)
Encounter complete. 

## 2020-02-15 ENCOUNTER — Other Ambulatory Visit (HOSPITAL_COMMUNITY): Payer: 59

## 2020-02-19 ENCOUNTER — Ambulatory Visit (HOSPITAL_COMMUNITY)
Admission: RE | Admit: 2020-02-19 | Discharge: 2020-02-19 | Disposition: A | Discharge status: Home / Self Care | Payer: 59 | Source: Ambulatory Visit | Attending: Cardiovascular Disease | Admitting: Cardiovascular Disease

## 2020-02-19 ENCOUNTER — Other Ambulatory Visit: Payer: Self-pay

## 2020-02-19 DIAGNOSIS — R072 Precordial pain: Secondary | ICD-10-CM | POA: Diagnosis not present

## 2020-02-19 LAB — EXERCISE TOLERANCE TEST
Estimated workload: 9.4 METS
Exercise duration (min): 7 min
Exercise duration (sec): 33 s
MPHR: 154 {beats}/min
Peak HR: 153 {beats}/min
Percent HR: 99 %
Rest HR: 84 {beats}/min

## 2020-04-23 DIAGNOSIS — N814 Uterovaginal prolapse, unspecified: Secondary | ICD-10-CM | POA: Diagnosis not present

## 2020-04-23 DIAGNOSIS — N816 Rectocele: Secondary | ICD-10-CM | POA: Diagnosis not present

## 2020-04-23 DIAGNOSIS — N8111 Cystocele, midline: Secondary | ICD-10-CM | POA: Diagnosis not present

## 2020-04-23 DIAGNOSIS — E2839 Other primary ovarian failure: Secondary | ICD-10-CM | POA: Diagnosis not present

## 2020-04-23 DIAGNOSIS — Z01419 Encounter for gynecological examination (general) (routine) without abnormal findings: Secondary | ICD-10-CM | POA: Diagnosis not present

## 2020-04-24 ENCOUNTER — Other Ambulatory Visit (HOSPITAL_COMMUNITY): Payer: Self-pay | Admitting: Family Medicine

## 2020-04-24 DIAGNOSIS — E119 Type 2 diabetes mellitus without complications: Secondary | ICD-10-CM | POA: Diagnosis not present

## 2020-04-27 ENCOUNTER — Other Ambulatory Visit: Payer: Self-pay | Admitting: Obstetrics and Gynecology

## 2020-04-27 DIAGNOSIS — E2839 Other primary ovarian failure: Secondary | ICD-10-CM

## 2020-04-27 DIAGNOSIS — Z1231 Encounter for screening mammogram for malignant neoplasm of breast: Secondary | ICD-10-CM

## 2020-05-05 ENCOUNTER — Telehealth: Payer: Self-pay | Admitting: Neurology

## 2020-05-05 ENCOUNTER — Encounter: Payer: Self-pay | Admitting: Neurology

## 2020-05-05 ENCOUNTER — Ambulatory Visit: Payer: 59 | Admitting: Neurology

## 2020-05-05 DIAGNOSIS — G35 Multiple sclerosis: Secondary | ICD-10-CM

## 2020-05-05 MED ORDER — ALPRAZOLAM 0.5 MG PO TABS: ORAL_TABLET | ORAL | 0 refills | Status: DC

## 2020-05-05 NOTE — Telephone Encounter (Signed)
Blood work results from primary physician was sent, done on 24 April 2020.  Glucose 137, BUN of 16, creatinine 0.95, BUN of 17, creatinine 141, potassium 4.7, chloride 101, CO2 26, calcium 10.1.  Hemoglobin A1c of 6.6.

## 2020-05-05 NOTE — Progress Notes (Signed)
Reason for visit: Multiple sclerosis, migraine headache, gait disorder  Katherine Yu is an 69 y.o. female  History of present illness:  Katherine Yu is a 69 year old right-handed white female with a history of multiple sclerosis. Katherine Yu primarily has brain involvement with this. She has a gait disturbance, she uses a cane for ambulation. She does have a history of migraine headaches as well. She has been on Copaxone, and overall she has tolerated this fairly well. More recently, she was diagnosed with diabetes and she is now trying to exercise more and get on a diabetic diet without going on medications. Katherine Yu over Katherine last 2 or 3 months she indicated that she had a fairly sudden onset of weakness in both legs associated with some discomfort and paresthesias that go down into Katherine thighs bilaterally. She has not noted any significant change in bladder or bowel control. She does have chronic issues with urinary frequency and urgency. She has not had any falls recently. She still uses a cane for ambulation. She improved after about 3 days of onset of symptoms but never got back to baseline. She never called our office regarding this. She initially had wanted to go off of Copaxone when seen 6 months ago, but she has decided to stay with Katherine medication.  Past Medical History:  Diagnosis Date  . ARTHRITIS 02/20/2009   Qualifier: Diagnosis of  By: Ronne Binning    . CHEST PAIN-PRECORDIAL 03/02/2009   Qualifier: Diagnosis of  By: Verl Blalock, MD, Delanna Ahmadi Chronic insomnia   . Degenerative arthritis   . DEPRESSION 02/20/2009   Qualifier: Diagnosis of  By: Ronne Binning    . Dyslipidemia   . FATIGUE 02/20/2009   Qualifier: Diagnosis of  By: Ronne Binning    . GERD 02/20/2009   Qualifier: Diagnosis of  By: Ronne Binning    . History of lower leg fracture   . History of peptic ulcer   . History of seizures    In childhood  . HYPERLIPIDEMIA-MIXED 05/04/2010    Qualifier: Diagnosis of  By: Verl Blalock, MD, Delanna Ahmadi Hypertension   . Migraine   . Mild obesity   . MULTIPLE SCLEROSIS 02/20/2009   Qualifier: Diagnosis of  By: Orville Govern CMA, Arbie Cookey    . PERS HX NONCOMPLIANCE W/MED TX PRS HAZARDS HLTH 05/04/2010   Qualifier: Diagnosis of  By: Verl Blalock, MD, Delanna Ahmadi Temporomandibular joint disease     Past Surgical History:  Procedure Laterality Date  . APPENDECTOMY    . GALLBLADDER SURGERY    . metal plate resection     skull  . pilonidal cyst      Family History  Adopted: Yes  Problem Relation Age of Onset  . Lupus Sister   . Cancer Sister   . Heart attack Sister   . Heart attack Sister   . Breast cancer Neg Hx     Social history:  reports that she has never smoked. She has never used smokeless tobacco. She reports that she does not drink alcohol and does not use drugs.    Allergies  Allergen Reactions  . Niacin Anaphylaxis, Hives and Itching    Medications:  Prior to Admission medications   Medication Sig Start Date End Date Taking? Authorizing Provider  aspirin 325 MG tablet Take 325 mg by mouth as needed.   Yes [provider]  benazepril (LOTENSIN) 40 MG  tablet Take 40 mg by mouth daily.  08/04/15  Yes [provider]  Cholecalciferol (VITAMIN D3) 5000 UNITS TABS Take 5,000 Units by mouth daily.   Yes [provider]  eletriptan (RELPAX) 40 MG tablet TAKE 1 TABLET (40 MG TOTAL) BY MOUTH AS NEEDED FOR MIGRAINE. MAY REPEAT IN 2 HOURS IF HEADACHE PERSISTS OR RECURS. 12/10/19  Yes [name], NP  famotidine (PEPCID) 20 MG tablet Take 20 mg by mouth as needed.    Yes [provider]  Glatiramer Acetate 40 MG/ML SOSY Inject 40 mg into Katherine skin 3 (three) times a week. 06/19/19  Yes Tresa Garter, MD  hydrochlorothiazide (HYDRODIURIL) 50 MG tablet Take 25 mg by mouth daily.  09/22/15  Yes [provider]  hydrOXYzine (ATARAX/VISTARIL) 25 MG tablet Take 1 tablet (25 mg total) by  mouth every 6 (six) hours as needed (for itching). 02/12/20  Yes [name], NP    ROS:  Out of a complete 14 system review of symptoms, Katherine Yu complains only of Katherine following symptoms, and all other reviewed systems are negative.  Walking difficulty Headache Back pain, leg discomfort  Height 5\' 6"  (1.676 m), weight 180 lb (81.6 kg).  Physical Exam  General: Katherine Yu is alert and cooperative at Katherine time of Katherine examination.  Skin: No significant peripheral edema is noted.   Neurologic Exam  Mental status: Katherine Yu is alert and oriented x 3 at Katherine time of Katherine examination. Katherine Yu has apparent normal recent and remote memory, with an apparently normal attention span and concentration ability.   Cranial nerves: Facial symmetry is present. Speech is normal, no aphasia or dysarthria is noted. Extraocular movements are full. Visual fields are full. Pupils are equal, round, and reactive to light. Discs are flat bilaterally.  Motor: Katherine Yu has good strength in all 4 extremities.  Sensory examination: Soft touch sensation is symmetric on Katherine face, arms, and legs.  Coordination: Katherine Yu has good finger-nose-finger and heel-to-shin bilaterally.  Gait and station: Katherine Yu has a wide-based gait, Katherine gait is somewhat unsteady but she can walk without a cane. Tandem gait is unsteady. Romberg is positive, she has tendency to fall backwards.  Reflexes: Deep tendon reflexes are symmetric.   Assessment/Plan:  1. Multiple sclerosis  2. Gait disorder  3. Migraine headache  Katherine Yu has had a recent event associated with weakness and paresthesias of Katherine lower extremities. Katherine episode came on rapidly and has not completely resolved. Katherine Yu be sent for MRI of Katherine thoracic spine with and without gadolinium and have MRI of Katherine lumbar spine. Katherine Yu will follow up here in 6 months, she will remain on Copaxone for now. She has had recent blood work done  through her primary care physician.  [name] MD 05/05/2020 8:43 AM  [facility] [address] [place], [place] [address]  Phone [phone] Fax [phone]

## 2020-05-06 ENCOUNTER — Ambulatory Visit
Admission: RE | Admit: 2020-05-06 | Discharge: 2020-05-06 | Disposition: A | Discharge status: Home / Self Care | Payer: 59 | Source: Ambulatory Visit | Attending: Obstetrics and Gynecology | Admitting: Obstetrics and Gynecology

## 2020-05-06 ENCOUNTER — Other Ambulatory Visit: Payer: Self-pay

## 2020-05-06 ENCOUNTER — Telehealth: Payer: Self-pay | Admitting: Neurology

## 2020-05-06 DIAGNOSIS — Z1231 Encounter for screening mammogram for malignant neoplasm of breast: Secondary | ICD-10-CM | POA: Diagnosis not present

## 2020-05-06 NOTE — Telephone Encounter (Signed)
[facility] Auth: [place] Ref # [id number].   Patient is scheduled at [facility] long for 05/18/20 to arrive at 11:30 AM. Patient is aware of time and day and I gave her their number of [phone] if she needed to r/s.

## 2020-05-07 ENCOUNTER — Other Ambulatory Visit: Payer: Self-pay

## 2020-05-07 ENCOUNTER — Encounter: Payer: Self-pay | Admitting: Dietician

## 2020-05-07 ENCOUNTER — Encounter: Payer: 59 | Attending: Family Medicine | Admitting: Dietician

## 2020-05-07 VITALS — Wt 178.1 lb

## 2020-05-07 DIAGNOSIS — E119 Type 2 diabetes mellitus without complications: Secondary | ICD-10-CM | POA: Insufficient documentation

## 2020-05-07 NOTE — Progress Notes (Signed)
Patient was seen on 05/07/2020 for the first of a series of three diabetes self-management courses at [facility].  Patient Education Plan per assessed needs and concerns is to attend three course education program for Diabetes Self Management Education.  The following learning objectives were met by the patient during this class:  Describe diabetes, types of diabetes and pathophysiology  State some common risk factors for diabetes  Defines the role of glucose and insulin  Describe the relationship between diabetes and cardiovascular and other risks  State the members of the Healthcare Team  States the rationale for glucose monitoring and when to test  State their individual Target Range  State the importance of logging glucose readings and how to interpret the readings  Identifies A1C target  Explain the correlation between A1c and eAG values  State symptoms and treatment of high blood glucose and low blood glucose  Explain proper technique for glucose testing and identify proper sharps disposal  Handouts given during class include:  How to Thrive:  A Guide for Your Journey with Diabetes by the ADA  Meal Plan Card and carbohydrate content list  Dietary intake form  Low Sodium Flavoring Tips  Types of Fats  Dining Out  Label reading  Snack list  Planning a balanced meal  The diabetes portion plate  Diabetes Resources  A1c to eAG Conversion Chart  Blood Glucose Log  Diabetes Recommended Care Schedule  Support Group  Diabetes Success Plan  Core Class Satisfaction Survey   Follow-Up Plan:  Attend core 2   

## 2020-05-14 ENCOUNTER — Encounter: Payer: Self-pay | Admitting: Dietician

## 2020-05-14 ENCOUNTER — Other Ambulatory Visit: Payer: Self-pay | Admitting: Neurology

## 2020-05-14 ENCOUNTER — Encounter: Payer: 59 | Admitting: Dietician

## 2020-05-14 ENCOUNTER — Other Ambulatory Visit: Payer: Self-pay

## 2020-05-14 DIAGNOSIS — E119 Type 2 diabetes mellitus without complications: Secondary | ICD-10-CM | POA: Diagnosis not present

## 2020-05-14 DIAGNOSIS — G35 Multiple sclerosis: Secondary | ICD-10-CM

## 2020-05-14 NOTE — Progress Notes (Signed)
Patient was seen on 05/14/2020 for the second of a series of three diabetes self-management courses at the [facility]. The following learning objectives were met by the patient during this class:   Describe the role of different macronutrients on glucose  Explain how carbohydrates affect blood glucose  State what foods contain the most carbohydrates  Demonstrate carbohydrate counting  Demonstrate how to read Nutrition Facts food label  Describe effects of various fats on heart health  Describe the importance of good nutrition for health and healthy eating strategies  Describe techniques for managing your shopping, cooking and meal planning  List strategies to follow meal plan when dining out  Describe the effects of alcohol on glucose and how to use it safely  Goals:  Follow Diabetes Meal Plan as instructed  Aim to spread carbs evenly throughout the day  Aim for 3 meals per day and snacks as needed Include lean protein foods to meals/snacks  Monitor glucose levels as instructed by your doctor   Follow-Up Plan:  Attend Core 3  Work towards following your personal food plan.

## 2020-05-18 ENCOUNTER — Ambulatory Visit (HOSPITAL_COMMUNITY)
Admission: RE | Admit: 2020-05-18 | Discharge: 2020-05-18 | Disposition: A | Discharge status: Home / Self Care | Payer: 59 | Source: Ambulatory Visit | Attending: Neurology | Admitting: Neurology

## 2020-05-18 ENCOUNTER — Telehealth: Payer: Self-pay | Admitting: Neurology

## 2020-05-18 ENCOUNTER — Other Ambulatory Visit: Payer: Self-pay

## 2020-05-18 DIAGNOSIS — G35 Multiple sclerosis: Secondary | ICD-10-CM | POA: Insufficient documentation

## 2020-05-18 DIAGNOSIS — M47814 Spondylosis without myelopathy or radiculopathy, thoracic region: Secondary | ICD-10-CM | POA: Diagnosis not present

## 2020-05-18 DIAGNOSIS — M47816 Spondylosis without myelopathy or radiculopathy, lumbar region: Secondary | ICD-10-CM | POA: Diagnosis not present

## 2020-05-18 DIAGNOSIS — M5127 Other intervertebral disc displacement, lumbosacral region: Secondary | ICD-10-CM | POA: Diagnosis not present

## 2020-05-18 DIAGNOSIS — M5021 Other cervical disc displacement,  high cervical region: Secondary | ICD-10-CM | POA: Diagnosis not present

## 2020-05-18 MED ORDER — GADOBUTROL 1 MMOL/ML IV SOLN
8.0000 mL | Freq: Once | INTRAVENOUS | Status: AC | PRN
  Administered 2020-05-18: 8 mL via INTRAVENOUS

## 2020-05-18 NOTE — Telephone Encounter (Signed)
I called the patient.  MRI of the cervical, thoracic, and lumbar spine does not show anything that would explain lower extremity paresthesias, no spinal cord lesions noted, no evidence of any significant arthritis or disc disease that would be surgical.   MRI cervical, thoracic, lumbar 05/18/20:  IMPRESSION: MRI CERVICAL SPINE:  No focal cord lesion or abnormal enhancement. Multilevel spondylosis with moderate left C5-6 neural foraminal narrowing.  Mild C5-6 spinal canal narrowing.  MRI THORACIC SPINE:  No focal cord lesion or abnormal enhancement.  Mild multilevel spondylosis without significant narrowing.  MRI LUMBAR SPINE:  Multilevel spondylosis.  Mild right L4-5 neural foraminal narrowing.  No significant spinal canal narrowing.

## 2020-05-21 ENCOUNTER — Encounter: Payer: Self-pay | Admitting: Neurology

## 2020-05-21 ENCOUNTER — Encounter: Payer: Self-pay | Admitting: Dietician

## 2020-05-21 ENCOUNTER — Other Ambulatory Visit: Payer: Self-pay

## 2020-05-21 ENCOUNTER — Telehealth: Payer: Self-pay

## 2020-05-21 ENCOUNTER — Encounter: Payer: 59 | Admitting: Dietician

## 2020-05-21 DIAGNOSIS — E119 Type 2 diabetes mellitus without complications: Secondary | ICD-10-CM | POA: Diagnosis not present

## 2020-05-21 NOTE — Progress Notes (Signed)
Patient was seen on 05/21/2020 for the third of a series of three diabetes self-management courses at the [facility].   Catalina Gravel the amount of activity recommended for healthy living . Describe activities suitable for individual needs . Identify ways to regularly incorporate activity into daily life . Identify barriers to activity and ways to over come these barriers  Identify diabetes medications being personally used and their primary action for lowering glucose and possible side effects . Describe role of stress on blood glucose and develop strategies to address psychosocial issues . Identify diabetes complications and ways to prevent them  Explain how to manage diabetes during illness . Evaluate success in meeting personal goal . Establish 2-3 goals that they will plan to diligently work on  Goals:   I will count my carb choices at most meals and snacks  I will be active 150 minutes a week  I will take my diabetes medications as scheduled  I will eat less unhealthy fats by eating a 1500 calorie diet  I will look at patterns in my record book at least 4 days a month  To help manage stress I will  meditate at least 7 times a week  Your patient has identified these potential barriers to change:  Motivation Stress   Your patient has identified their diabetes self-care support plan as  Family Support On-line Resources  [facility] Support Group   American Diabetes Association Website    Plan:  Attend Support Group as desired

## 2020-05-21 NOTE — Telephone Encounter (Signed)
(  Key: [id number]) - [id number] Glatiramer Acetate 40MG /ML syringes     Status: Sent to Plan  Created: September 16th, 2021 [phone]  Sent: September 16th, 2021

## 2020-05-25 ENCOUNTER — Other Ambulatory Visit: Payer: Self-pay | Admitting: Pharmacist

## 2020-05-25 MED ORDER — GLATIRAMER ACETATE 40 MG/ML ~~LOC~~ SOSY: 40.0000 mg | PREFILLED_SYRINGE | SUBCUTANEOUS | 12 refills | Status: DC

## 2020-06-02 DIAGNOSIS — Z23 Encounter for immunization: Secondary | ICD-10-CM | POA: Diagnosis not present

## 2020-06-08 ENCOUNTER — Other Ambulatory Visit: Payer: Self-pay

## 2020-06-08 ENCOUNTER — Encounter: Payer: Self-pay | Admitting: Podiatry

## 2020-06-08 ENCOUNTER — Ambulatory Visit (INDEPENDENT_AMBULATORY_CARE_PROVIDER_SITE_OTHER): Payer: 59 | Admitting: Podiatry

## 2020-06-08 DIAGNOSIS — M2011 Hallux valgus (acquired), right foot: Secondary | ICD-10-CM | POA: Diagnosis not present

## 2020-06-08 DIAGNOSIS — M2041 Other hammer toe(s) (acquired), right foot: Secondary | ICD-10-CM | POA: Diagnosis not present

## 2020-06-08 DIAGNOSIS — M2012 Hallux valgus (acquired), left foot: Secondary | ICD-10-CM

## 2020-06-08 DIAGNOSIS — M79674 Pain in right toe(s): Secondary | ICD-10-CM | POA: Diagnosis not present

## 2020-06-08 DIAGNOSIS — B351 Tinea unguium: Secondary | ICD-10-CM | POA: Diagnosis not present

## 2020-06-08 DIAGNOSIS — G35 Multiple sclerosis: Secondary | ICD-10-CM

## 2020-06-08 DIAGNOSIS — M2042 Other hammer toe(s) (acquired), left foot: Secondary | ICD-10-CM

## 2020-06-08 DIAGNOSIS — M79675 Pain in left toe(s): Secondary | ICD-10-CM

## 2020-06-08 DIAGNOSIS — E119 Type 2 diabetes mellitus without complications: Secondary | ICD-10-CM | POA: Diagnosis not present

## 2020-06-08 NOTE — Progress Notes (Signed)
Subjective: Katherine Yu presents today referred by Leonard Downing, MD for diabetic foot evaluation.  Patient relates 2 month  history of diabetes. She also has h/o MS. She does relate h/o plantar fasciitis, h/o ankle trouble and h/o broken 5th toes from various traumas over the years.  Patient denies any history of foot wounds.  Patient denies any history of numbness, tingling, burning, pins/needles sensations.  Today, patient c/o of painful, discolored, thick toenails which interfere with daily activities.  Pain is aggravated when wearing enclosed shoe gear.   Past Medical History:  Diagnosis Date  . ARTHRITIS 02/20/2009   Qualifier: Diagnosis of  By: Ronne Binning    . CHEST PAIN-PRECORDIAL 03/02/2009   Qualifier: Diagnosis of  By: Verl Blalock, MD, Delanna Ahmadi Chronic insomnia   . Degenerative arthritis   . DEPRESSION 02/20/2009   Qualifier: Diagnosis of  By: Ronne Binning    . Dyslipidemia   . FATIGUE 02/20/2009   Qualifier: Diagnosis of  By: Ronne Binning    . GERD 02/20/2009   Qualifier: Diagnosis of  By: Ronne Binning    . History of lower leg fracture   . History of peptic ulcer   . History of seizures    In childhood  . HYPERLIPIDEMIA-MIXED 05/04/2010   Qualifier: Diagnosis of  By: Verl Blalock, MD, Delanna Ahmadi Hypertension   . Migraine   . Mild obesity   . MULTIPLE SCLEROSIS 02/20/2009   Qualifier: Diagnosis of  By: Orville Govern CMA, Arbie Cookey    . PERS HX NONCOMPLIANCE W/MED TX PRS HAZARDS HLTH 05/04/2010   Qualifier: Diagnosis of  By: Verl Blalock, MD, Delanna Ahmadi Temporomandibular joint disease     Patient Active Problem List   Diagnosis Date Noted  . Educated about COVID-19 virus infection 02/08/2020  . Common migraine 10/15/2014  . HYPERLIPIDEMIA-MIXED 05/04/2010  . PERS HX NONCOMPLIANCE W/MED TX PRS HAZARDS HLTH 05/04/2010  . CHEST PAIN-UNSPECIFIED 03/02/2009  . CHEST PAIN-PRECORDIAL 03/02/2009  . DEPRESSION 02/20/2009  . MULTIPLE  SCLEROSIS 02/20/2009  . HYPERTENSION, UNSPECIFIED 02/20/2009  . GERD 02/20/2009  . ARTHRITIS 02/20/2009  . DEGENERATIVE DISC DISEASE 02/20/2009  . FATIGUE 02/20/2009  . DYSPNEA ON EXERTION 02/20/2009  . FREQUENCY, URINARY 02/20/2009    Past Surgical History:  Procedure Laterality Date  . APPENDECTOMY    . GALLBLADDER SURGERY    . metal plate resection     skull  . pilonidal cyst      Current Outpatient Medications on File Prior to Visit  Medication Sig Dispense Refill  . ALPRAZolam (XANAX) 0.5 MG tablet Take 2 tablets approximately 45 minutes prior to the MRI study, take a third tablet if needed. 3 tablet 0  . aspirin 325 MG tablet Take 325 mg by mouth as needed.    . benazepril (LOTENSIN) 40 MG tablet Take 40 mg by mouth daily.   4  . Cholecalciferol (VITAMIN D3) 5000 UNITS TABS Take 5,000 Units by mouth daily.    Marland Kitchen eletriptan (RELPAX) 40 MG tablet TAKE 1 TABLET (40 MG TOTAL) BY MOUTH AS NEEDED FOR MIGRAINE. MAY REPEAT IN 2 HOURS IF HEADACHE PERSISTS OR RECURS. 24 tablet 5  . famotidine (PEPCID) 20 MG tablet Take 20 mg by mouth as needed.     Marland Kitchen Glatiramer Acetate 40 MG/ML SOSY Inject 40 mg into the skin 3 (three) times a week. 12 mL 12  . hydrochlorothiazide (HYDRODIURIL) 50 MG tablet Take 25 mg  by mouth daily.   0  . hydrOXYzine (ATARAX/VISTARIL) 25 MG tablet Take 1 tablet (25 mg total) by mouth every 6 (six) hours as needed (for itching). 30 tablet 5   No current facility-administered medications on file prior to visit.     Allergies  Allergen Reactions  . Niacin Anaphylaxis, Hives and Itching    Social History   Occupational History  . Occupation: Equities trader  Tobacco Use  . Smoking status: Never Smoker  . Smokeless tobacco: Never Used  Substance and Sexual Activity  . Alcohol use: No  . Drug use: No  . Sexual activity: Not on file    Family History  Adopted: Yes  Problem Relation Age of Onset  . Lupus Sister   . Cancer Sister   . Heart attack Sister    . Heart attack Sister   . Breast cancer Neg Hx     Immunization History  Administered Date(s) Administered  . PFIZER SARS-COV-2 Vaccination 11/15/2019, 12/09/2019    Objective: There were no vitals filed for this visit.  Katherine Yu is a pleasant 69 y.o. female WD, WN in NAD. AAO X 3.  Vascular Examination: Capillary refill time to digits immediate b/l. Palpable pedal pulses b/l LE. Pedal hair sparse. Lower extremity skin temperature gradient within normal limits. No pain with calf compression b/l. No edema noted b/l lower extremities.  Dermatological Examination: Pedal skin with normal turgor, texture and tone bilaterally. No open wounds bilaterally. No interdigital macerations bilaterally. Toenails 1-5 b/l elongated, discolored, dystrophic, thickened, crumbly with subungual debris and tenderness to dorsal palpation.  Musculoskeletal Examination: Normal muscle strength 5/5 to all lower extremity muscle groups bilaterally. No pain crepitus or joint limitation noted with ROM b/l. Hallux valgus with bunion deformity noted b/l lower extremities. Hammertoes noted to the b/l lower extremities. Utilizes cane for ambulation assistance.  Footwear Assessment: Does the patient wear appropriate shoes? Yes. Does the patient need inserts/orthotics? No.  Neurological Examination: Protective sensation intact 5/5 intact bilaterally with 10g monofilament b/l. Vibratory sensation intact b/l.  Assessment: 1. Pain due to onychomycosis of toenails of both feet   2. MULTIPLE SCLEROSIS   3. Hallux valgus, acquired, bilateral   4. Acquired hammertoes of both feet   5. Encounter for diabetic foot exam ([place])     ADA Risk Categorization:  Low Risk:  Patient has all of the following: Intact protective sensation No prior foot ulcer  No severe deformity Pedal pulses present  Plan: -Examined patient. -Diabetic foot examination performed today. -Discussed diabetic foot care principles.  Literature dispensed on today. -Toenails 1-5 b/l were debrided in length and girth with sterile nail nippers and dremel without iatrogenic bleeding.  -Patient to report any pedal injuries to medical professional immediately. -Discussed topical, laser and oral medication. Patient opted for OTC topical treatment. -Patient to continue soft, supportive shoe gear daily. -Patient/POA to call should there be question/concern in the interim.  Return in about 3 months (around 09/08/2020).

## 2020-06-08 NOTE — Patient Instructions (Signed)
Diabetes Mellitus and Foot Care Foot care is an important part of your health, especially when you have diabetes. Diabetes may cause you to have problems because of poor blood flow (circulation) to your feet and legs, which can cause your skin to:  Become thinner and drier.  Break more easily.  Heal more slowly.  Peel and crack. You may also have nerve damage (neuropathy) in your legs and feet, causing decreased feeling in them. This means that you may not notice minor injuries to your feet that could lead to more serious problems. Noticing and addressing any potential problems early is the best way to prevent future foot problems. How to care for your feet Foot hygiene  Wash your feet daily with warm water and mild soap. Do not use hot water. Then, pat your feet and the areas between your toes until they are completely dry. Do not soak your feet as this can dry your skin.  Trim your toenails straight across. Do not dig under them or around the cuticle. File the edges of your nails with an emery board or nail file.  Apply a moisturizing lotion or petroleum jelly to the skin on your feet and to dry, brittle toenails. Use lotion that does not contain alcohol and is unscented. Do not apply lotion between your toes. Shoes and socks  Wear clean socks or stockings every day. Make sure they are not too tight. Do not wear knee-high stockings since they may decrease blood flow to your legs.  Wear shoes that fit properly and have enough cushioning. Always look in your shoes before you put them on to be sure there are no objects inside.  To break in new shoes, wear them for just a few hours a day. This prevents injuries on your feet. Wounds, scrapes, corns, and calluses  Check your feet daily for blisters, cuts, bruises, sores, and redness. If you cannot see the bottom of your feet, use a mirror or ask someone for help.  Do not cut corns or calluses or try to remove them with medicine.  If you  find a minor scrape, cut, or break in the skin on your feet, keep it and the skin around it clean and dry. You may clean these areas with mild soap and water. Do not clean the area with peroxide, alcohol, or iodine.  If you have a wound, scrape, corn, or callus on your foot, look at it several times a day to make sure it is healing and not infected. Check for: ? Redness, swelling, or pain. ? Fluid or blood. ? Warmth. ? Pus or a bad smell. General instructions  Do not cross your legs. This may decrease blood flow to your feet.  Do not use heating pads or hot water bottles on your feet. They may burn your skin. If you have lost feeling in your feet or legs, you may not know this is happening until it is too late.  Protect your feet from hot and cold by wearing shoes, such as at the beach or on hot pavement.  Schedule a complete foot exam at least once a year (annually) or more often if you have foot problems. If you have foot problems, report any cuts, sores, or bruises to your health care provider immediately. Contact a health care provider if:  You have a medical condition that increases your risk of infection and you have any cuts, sores, or bruises on your feet.  You have an injury that is not   healing.  You have redness on your legs or feet.  You feel burning or tingling in your legs or feet.  You have pain or cramps in your legs and feet.  Your legs or feet are numb.  Your feet always feel cold.  You have pain around a toenail. Get help right away if:  You have a wound, scrape, corn, or callus on your foot and: ? You have pain, swelling, or redness that gets worse. ? You have fluid or blood coming from the wound, scrape, corn, or callus. ? Your wound, scrape, corn, or callus feels warm to the touch. ? You have pus or a bad smell coming from the wound, scrape, corn, or callus. ? You have a fever. ? You have a red line going up your leg. Summary  Check your feet every day  for cuts, sores, red spots, swelling, and blisters.  Moisturize feet and legs daily.  Wear shoes that fit properly and have enough cushioning.  If you have foot problems, report any cuts, sores, or bruises to your health care provider immediately.  Schedule a complete foot exam at least once a year (annually) or more often if you have foot problems. This information is not intended to replace advice given to you by your health care provider. Make sure you discuss any questions you have with your health care provider. Document Revised: 05/15/2019 Document Reviewed: 09/23/2016 Elsevier Patient Education  [place]  A bunion is a bump on the base of the big toe that forms when the bones of the big toe joint move out of position. Bunions may be small at first, but they often get larger over time. They can make walking painful. What are the causes? A bunion may be caused by:  Wearing narrow or pointed shoes that force the big toe to press against the other toes.  Abnormal foot development that causes the foot to roll inward (pronate).  Changes in the foot that are caused by certain diseases, such as rheumatoid arthritis or polio.  A foot injury. What increases the risk? The following factors may make you more likely to develop this condition:  Wearing shoes that squeeze the toes together.  Having certain diseases, such as: ? Rheumatoid arthritis. ? Polio. ? Cerebral palsy.  Having family members who have bunions.  Being born with a foot deformity, such as flat feet or low arches.  Doing activities that put a lot of pressure on the feet, such as ballet dancing. What are the signs or symptoms? The main symptom of a bunion is a noticeable bump on the big toe. Other symptoms may include:  Pain.  Swelling around the big toe.  Redness and inflammation.  Thick or hardened skin on the big toe or between the toes.  Stiffness or loss of motion in the big  toe.  Trouble with walking. How is this diagnosed? A bunion may be diagnosed based on your symptoms, medical history, and activities. You may have tests, such as:  X-rays. These allow your health care provider to check the position of the bones in your foot and look for damage to your joint. They also help your health care provider determine the severity of your bunion and the best way to treat it.  Joint aspiration. In this test, a sample of fluid is removed from the toe joint. This test may be done if you are in a lot of pain. It helps rule out diseases that cause  painful swelling of the joints, such as arthritis. How is this treated? Treatment depends on the severity of your symptoms. The goal of treatment is to relieve symptoms and prevent the bunion from getting worse. Your health care provider may recommend:  Wearing shoes that have a wide toe box.  Using bunion pads to cushion the affected area.  Taping your toes together to keep them in a normal position.  Placing a device inside your shoe (orthotics) to help reduce pressure on your toe joint.  Taking medicine to ease pain, inflammation, and swelling.  Applying heat or ice to the affected area.  Doing stretching exercises.  Surgery to remove scar tissue and move the toes back into their normal position. This treatment is rare. Follow these instructions at home: Managing pain, stiffness, and swelling   If directed, put ice on the painful area: ? Put ice in a plastic bag. ? Place a towel between your skin and the bag. ? Leave the ice on for 20 minutes, 2-3 times a day. Activity   If directed, apply heat to the affected area before you exercise. Use the heat source that your health care provider recommends, such as a moist heat pack or a heating pad. ? Place a towel between your skin and the heat source. ? Leave the heat on for 20-30 minutes. ? Remove the heat if your skin turns bright red. This is especially important  if you are unable to feel pain, heat, or cold. You may have a greater risk of getting burned.  Do exercises as told by your health care provider. General instructions  Support your toe joint with proper footwear, shoe padding, or taping as told by your health care provider.  Take over-the-counter and prescription medicines only as told by your health care provider.  Keep all follow-up visits as told by your health care provider. This is important. Contact a health care provider if your symptoms:  Get worse.  Do not improve in 2 weeks. Get help right away if you have:  Severe pain and trouble with walking. Summary  A bunion is a bump on the base of the big toe that forms when the bones of the big toe joint move out of position.  Bunions can make walking painful.  Treatment depends on the severity of your symptoms.  Support your toe joint with proper footwear, shoe padding, or taping as told by your health care provider. This information is not intended to replace advice given to you by your health care provider. Make sure you discuss any questions you have with your health care provider. Document Revised: 02/26/2018 Document Reviewed: 01/02/2018 Elsevier Patient Education  [place]  Hammer toe is a change in the shape (a deformity) of your toe. The deformity causes the middle joint of your toe to stay bent. This causes pain, especially when you are wearing shoes. Hammer toe starts gradually. At first, the toe can be straightened. Gradually over time, the deformity becomes stiff and permanent. Early treatments to keep the toe straight may relieve pain. As the deformity becomes stiff and permanent, surgery may be needed to straighten the toe. What are the causes? Hammer toe is caused by abnormal bending of the toe joint that is closest to your foot. It happens gradually over time. This pulls on the muscles and connections (tendons) of the toe joint, making  them weak and stiff. It is often related to wearing shoes that are too short or  narrow and do not let your toes straighten. What increases the risk? You may be at greater risk for hammer toe if you:  Are female.  Are older.  Wear shoes that are too small.  Wear high-heeled shoes that pinch your toes.  Are a Engineer, mining.  Have a second toe that is longer than your big toe (first toe).  Injure your foot or toe.  Have arthritis.  Have a family history of hammer toe.  Have a nerve or muscle disorder. What are the signs or symptoms? The main symptoms of this condition are pain and deformity of the toe. The pain is worse when wearing shoes, walking, or running. Other symptoms may include:  Corns or calluses over the bent part of the toe or between the toes.  Redness and a burning feeling on the toe.  An open sore that forms on the top of the toe.  Not being able to straighten the toe. How is this diagnosed? This condition is diagnosed based on your symptoms and a physical exam. During the exam, your health care provider will try to straighten your toe to see how stiff the deformity is. You may also have tests, such as:  A blood test to check for rheumatoid arthritis.  An X-ray to show how severe the deformity is. How is this treated? Treatment for this condition will depend on how stiff the deformity is. Surgery is often needed. However, sometimes a hammer toe can be straightened without surgery. Treatments that do not involve surgery include:  Taping the toe into a straightened position.  Using pads and cushions to protect the toe (orthotics).  Wearing shoes that provide enough room for the toes.  Doing toe-stretching exercises at home.  Taking an NSAID to reduce pain and swelling. If these treatments do not help or the toe cannot be straightened, surgery is the next option. The most common surgeries used to straighten a hammer toe include:  Arthroplasty. In this  procedure, part of the joint is removed, and that allows the toe to straighten.  Fusion. In this procedure, cartilage between the two bones of the joint is taken out and the bones are fused together into one longer bone.  Implantation. In this procedure, part of the bone is removed and replaced with an implant to let the toe move again.  Flexor tendon transfer. In this procedure, the tendons that curl the toes down (flexor tendons) are repositioned. Follow these instructions at home:  Take over-the-counter and prescription medicines only as told by your health care provider.  Do toe straightening and stretching exercises as told by your health care provider.  Keep all follow-up visits as told by your health care provider. This is important. How is this prevented?  Wear shoes that give your toes enough room and do not cause pain.  Do not wear high-heeled shoes. Contact a health care provider if:  Your pain gets worse.  Your toe becomes red or swollen.  You develop an open sore on your toe. This information is not intended to replace advice given to you by your health care provider. Make sure you discuss any questions you have with your health care provider. Document Revised: 08/04/2017 Document Reviewed: 12/16/2015 Elsevier Patient Education  2020 [employer].   Multiple Sclerosis Multiple sclerosis (MS) is a disease of the brain, spinal cord, and optic nerves (central nervous system). It causes the body's disease-fighting (immune) system to destroy the protective covering (myelin sheath) around nerves in the brain.  When this happens, signals (nerve impulses) going to and from the brain and spinal cord do not get sent properly or may not get sent at all. There are several types of MS:  Relapsing-remitting MS. This is the most common type. This causes sudden attacks of symptoms. After an attack, you may recover completely until the next attack, or some symptoms may remain  permanently.  Secondary progressive MS. This usually develops after the onset of relapsing-remitting MS. Similar to relapsing-remitting MS, this type also causes sudden attacks of symptoms. Attacks may be less frequent, but symptoms slowly get worse (progress) over time.  Primary progressive MS. This causes symptoms that steadily progress over time. This type of MS does not cause sudden attacks of symptoms. The age of onset of MS varies, but it often develops between 66-72 years of age. MS is a lifelong (chronic) condition. There is no cure, but treatment can help slow down the progression of the disease. What are the causes? The cause of this condition is not known. What increases the risk? You are more likely to develop this condition if:  You are a woman.  You have a relative with MS. However, the condition is not passed from parent to child (inherited).  You have a lack (deficiency) of vitamin D.  You smoke. MS is more common in the [place] than in the [place]. What are the signs or symptoms? Relapsing-remitting and secondary progressive MS cause symptoms to occur in episodes or attacks that may last weeks to months. There may be long periods between attacks in which there are almost no symptoms. Primary progressive MS causes symptoms to steadily progress after they develop. Symptoms of MS vary because of the many different ways it affects the central nervous system. The main symptoms include:  Vision problems and eye pain.  Numbness.  Weakness.  Inability to move your arms, hands, feet, or legs (paralysis).  Balance problems.  Shaking that you cannot control (tremors).  Muscle spasms.  Problems with thinking (cognitive changes). MS can also cause symptoms that are associated with the disease, but are not always the direct result of an MS attack. They may include:  Inability to control urination or bowel movements  (incontinence).  Headaches.  Fatigue.  Inability to tolerate heat.  Emotional changes.  Depression.  Pain. How is this diagnosed? This condition is diagnosed based on:  Your symptoms.  A neurological exam. This involves checking central nervous system function, such as nerve function, reflexes, and coordination.  MRIs of the brain and spinal cord.  Lab tests, including a lumbar puncture that tests the fluid that surrounds the brain and spinal cord (cerebrospinal fluid).  Tests to measure the electrical activity of the brain in response to stimulation (evoked potentials). How is this treated? There is no cure for MS, but medicines can help decrease the number and frequency of attacks and help relieve nuisance symptoms. Treatment options may include:  Medicines that reduce the frequency of attacks. These medicines may be given by injection, by mouth (orally), or through an IV.  Medicines that reduce inflammation (steroids). These may provide short-term relief of symptoms.  Medicines to help control pain, depression, fatigue, or incontinence.  Vitamin D, if you have a deficiency.  Using devices to help you move around (assistive devices), such as braces, a cane, or a walker.  Physical therapy to strengthen and stretch your muscles.  Occupational therapy to help you with everyday tasks.  Alternative or complementary treatments such  as exercise, massage, or acupuncture. Follow these instructions at home:  Take over-the-counter and prescription medicines only as told by your health care provider.  Do not drive or use heavy machinery while taking prescription pain medicine.  Use assistive devices as recommended by your physical therapist or your health care provider.  Exercise as directed by your health care provider.  Return to your normal activities as told by your health care provider. Ask your health care provider what activities are safe for you.  Reach out for  support. Share your feelings with friends, family, or a support group.  Keep all follow-up visits as told by your health care provider and therapists. This is important. Where to find more information  National Multiple Sclerosis Society: https://www.nationalmssociety.org Contact a health care provider if:  You feel depressed.  You develop new pain or numbness.  You have tremors.  You have problems with sexual function. Get help right away if:  You develop paralysis.  You develop numbness.  You have problems with your bladder or bowel function.  You develop double vision.  You lose vision in one or both eyes.  You develop suicidal thoughts.  You develop severe confusion. If you ever feel like you may hurt yourself or others, or have thoughts about taking your own life, get help right away. You can go to your nearest emergency department or call:  Your local emergency services (911 in the [place]  A suicide crisis helpline, such as the [place] at 939 769 1852. This is open 24 hours a day. Summary  Multiple sclerosis (MS) is a disease of the central nervous system that causes the body's immune system to destroy the protective covering (myelin sheath) around nerves in the brain.  There are 3 types of MS: relapsing-remitting, secondary progressive, and primary progressive. Relapsing-remitting and secondary progressive MS cause symptoms to occur in episodes or attacks that may last weeks to months. Primary progressive MS causes symptoms to steadily progress after they develop.  There is no cure for MS, but medicines can help decrease the number and frequency of attacks and help relieve nuisance symptoms. Treatment may also include physical or occupational therapy.  If you develop numbness, paralysis, vision problems, or other neurological symptoms, get help right away. This information is not intended to replace advice given to you by your health  care provider. Make sure you discuss any questions you have with your health care provider. Document Revised: 08/04/2017 Document Reviewed: 10/31/2016 Elsevier Patient Education  2020 [employer].

## 2020-06-15 NOTE — Telephone Encounter (Signed)
PA for Glatiramer Acetate is approved Notice faxed to pharmacy

## 2020-06-23 ENCOUNTER — Ambulatory Visit: Payer: 59 | Admitting: Dietician

## 2020-07-20 DIAGNOSIS — R350 Frequency of micturition: Secondary | ICD-10-CM | POA: Diagnosis not present

## 2020-07-20 DIAGNOSIS — N3941 Urge incontinence: Secondary | ICD-10-CM | POA: Diagnosis not present

## 2020-07-20 DIAGNOSIS — M722 Plantar fascial fibromatosis: Secondary | ICD-10-CM | POA: Diagnosis not present

## 2020-07-20 DIAGNOSIS — E119 Type 2 diabetes mellitus without complications: Secondary | ICD-10-CM | POA: Diagnosis not present

## 2020-07-22 LAB — LAB REPORT - SCANNED
Albumin, Urine POC: 5.2
Albumin/Creatinine Ratio, Urine, POC: 25
Creatinine, POC: 20.5 mg/dL

## 2020-07-23 ENCOUNTER — Encounter: Payer: Self-pay | Admitting: Podiatry

## 2020-07-23 ENCOUNTER — Encounter: Payer: Self-pay | Admitting: Neurology

## 2020-07-23 NOTE — Telephone Encounter (Addendum)
Pt  has had increase in urine incontinence. mychart message pt states:: " I am having multiple bladder accidents and urgency every day.  My Family Medicine MD did a ure study this week which reports back today as essentially unremarkable - nothing grew up.  I am wondering if this could be MS related.  I am used to having occassional blader accidents but this is every day.  I am wearing Depends it is so bad.   She is not having any issues with weakness, numbness, tingling. Have not heard back on balance issues.  I tried to contact pt and LMVM.  Dr. Jannifer Franklin out of office.  She in on glatiramir acetate for MS, and had MRI 05/2020.  Please advise.

## 2020-07-24 ENCOUNTER — Ambulatory Visit (INDEPENDENT_AMBULATORY_CARE_PROVIDER_SITE_OTHER): Payer: 59

## 2020-07-24 ENCOUNTER — Other Ambulatory Visit: Payer: Self-pay

## 2020-07-24 ENCOUNTER — Ambulatory Visit: Payer: 59 | Admitting: Podiatry

## 2020-07-24 DIAGNOSIS — M722 Plantar fascial fibromatosis: Secondary | ICD-10-CM

## 2020-07-24 NOTE — Patient Instructions (Signed)
Plantar Fasciitis  Plantar fasciitis is a painful foot condition that affects the heel. It occurs when the band of tissue that connects the toes to the heel bone (plantar fascia) becomes irritated. This can happen as the result of exercising too much or doing other repetitive activities (overuse injury). The pain from plantar fasciitis can range from mild irritation to severe pain that makes it difficult to walk or move. The pain is usually worse in the morning after sleeping, or after sitting or lying down for a while. Pain may also be worse after long periods of walking or standing. What are the causes? This condition may be caused by:  Standing for long periods of time.  Wearing shoes that do not have good arch support.  Doing activities that put stress on joints (high-impact activities), including running, aerobics, and ballet.  Being overweight.  An abnormal way of walking (gait).  Tight muscles in the back of your lower leg (calf).  High arches in your feet.  Starting a new athletic activity. What are the signs or symptoms? The main symptom of this condition is heel pain. Pain may:  Be worse with first steps after a time of rest, especially in the morning after sleeping or after you have been sitting or lying down for a while.  Be worse after long periods of standing still.  Decrease after 30-45 minutes of activity, such as gentle walking. How is this diagnosed? This condition may be diagnosed based on your medical history and your symptoms. Your health care provider may ask questions about your activity level. Your health care provider will do a physical exam to check for:  A tender area on the bottom of your foot.  A high arch in your foot.  Pain when you move your foot.  Difficulty moving your foot. You may have imaging tests to confirm the diagnosis, such as:  X-rays.  Ultrasound.  MRI. How is this treated? Treatment for plantar fasciitis depends on how  severe your condition is. Treatment may include:  Rest, ice, applying pressure (compression), and raising the affected foot (elevation). This may be called RICE therapy. Your health care provider may recommend RICE therapy along with over-the-counter pain medicines to manage your pain.  Exercises to stretch your calves and your plantar fascia.  A splint that holds your foot in a stretched, upward position while you sleep (night splint).  Physical therapy to relieve symptoms and prevent problems in the future.  Injections of steroid medicine (cortisone) to relieve pain and inflammation.  Stimulating your plantar fascia with electrical impulses (extracorporeal shock wave therapy). This is usually the last treatment option before surgery.  Surgery, if other treatments have not worked after 12 months. Follow these instructions at home:  Managing pain, stiffness, and swelling  If directed, put ice on the painful area: ? Put ice in a plastic bag, or use a frozen bottle of water. ? Place a towel between your skin and the bag or bottle. ? Roll the bottom of your foot over the bag or bottle. ? Do this for 20 minutes, 2-3 times a day.  Wear athletic shoes that have air-sole or gel-sole cushions, or try wearing soft shoe inserts that are designed for plantar fasciitis.  Raise (elevate) your foot above the level of your heart while you are sitting or lying down. Activity  Avoid activities that cause pain. Ask your health care provider what activities are safe for you.  Do physical therapy exercises and stretches as told   by your health care provider.  Try activities and forms of exercise that are easier on your joints (low-impact). Examples include swimming, water aerobics, and biking. General instructions  Take over-the-counter and prescription medicines only as told by your health care provider.  Wear a night splint while sleeping, if told by your health care provider. Loosen the splint  if your toes tingle, become numb, or turn cold and blue.  Maintain a healthy weight, or work with your health care provider to lose weight as needed.  Keep all follow-up visits as told by your health care provider. This is important. Contact a health care provider if you:  Have symptoms that do not go away after caring for yourself at home.  Have pain that gets worse.  Have pain that affects your ability to move or do your daily activities. Summary  Plantar fasciitis is a painful foot condition that affects the heel. It occurs when the band of tissue that connects the toes to the heel bone (plantar fascia) becomes irritated.  The main symptom of this condition is heel pain that may be worse after exercising too much or standing still for a long time.  Treatment varies, but it usually starts with rest, ice, compression, and elevation (RICE therapy) and over-the-counter medicines to manage pain. This information is not intended to replace advice given to you by your health care provider. Make sure you discuss any questions you have with your health care provider. Document Revised: 08/04/2017 Document Reviewed: 06/19/2017 Elsevier Patient Education  [place].  Exercises for the foot include:  Stretches to help lengthen the lower leg and plantar fascia areas Theraband exercises for the lower leg and ankle to help strengthen the surrounding area- dorsiflexion, plantarflexion, inversion, eversion Massage rolling on the plantar surface of the foot with a frozen bottle, tennis ball or golf ball Towel or marble pick-ups to strengthen the plantar surface of the foot Weight bearing exercises to increase balance and overall stability

## 2020-07-26 ENCOUNTER — Encounter: Payer: Self-pay | Admitting: Podiatry

## 2020-07-26 NOTE — Progress Notes (Signed)
SUBJECTIVE: Katherine Yu presents to clinic on today with cc of painful right heel. Pain started mid-October.  Patient admits pain with first step in the morning and after periods of rest. On a scale of 1-10, patient relates pain level is a 10 . Prior treatments include OTC Dr. Felicie Morn inserts with no relief of symptoms.  Patient has been walking for exercise. She walks at least 30 minutes per day and heel pain is interfering with her daily activities. She has also been taking ibuprofen for pain.   She has h/o reaction to steroid injection of her right knee. Therefore, she is apprehensive about a steroid injection in her foot today.  Past Medical History:  Diagnosis Date  . ARTHRITIS 02/20/2009   Qualifier: Diagnosis of  By: Ronne Binning    . CHEST PAIN-PRECORDIAL 03/02/2009   Qualifier: Diagnosis of  By: Verl Blalock, MD, Delanna Ahmadi Chronic insomnia   . Degenerative arthritis   . DEPRESSION 02/20/2009   Qualifier: Diagnosis of  By: Ronne Binning    . Dyslipidemia   . FATIGUE 02/20/2009   Qualifier: Diagnosis of  By: Ronne Binning    . GERD 02/20/2009   Qualifier: Diagnosis of  By: Ronne Binning    . History of lower leg fracture   . History of peptic ulcer   . History of seizures    In childhood  . HYPERLIPIDEMIA-MIXED 05/04/2010   Qualifier: Diagnosis of  By: Verl Blalock, MD, Delanna Ahmadi Hypertension   . Migraine   . Mild obesity   . MULTIPLE SCLEROSIS 02/20/2009   Qualifier: Diagnosis of  By: Orville Govern CMA, Arbie Cookey    . PERS HX NONCOMPLIANCE W/MED TX PRS HAZARDS HLTH 05/04/2010   Qualifier: Diagnosis of  By: Verl Blalock, MD, Delanna Ahmadi Temporomandibular joint disease      Patient Active Problem List   Diagnosis Date Noted  . Educated about COVID-19 virus infection 02/08/2020  . Common migraine 10/15/2014  . HYPERLIPIDEMIA-MIXED 05/04/2010  . PERS HX NONCOMPLIANCE W/MED TX PRS HAZARDS HLTH 05/04/2010  . CHEST PAIN-UNSPECIFIED 03/02/2009  . CHEST  PAIN-PRECORDIAL 03/02/2009  . DEPRESSION 02/20/2009  . MULTIPLE SCLEROSIS 02/20/2009  . HYPERTENSION, UNSPECIFIED 02/20/2009  . GERD 02/20/2009  . ARTHRITIS 02/20/2009  . DEGENERATIVE DISC DISEASE 02/20/2009  . FATIGUE 02/20/2009  . DYSPNEA ON EXERTION 02/20/2009  . FREQUENCY, URINARY 02/20/2009     Past Surgical History:  Procedure Laterality Date  . APPENDECTOMY    . GALLBLADDER SURGERY    . metal plate resection     skull  . pilonidal cyst        Current Outpatient Medications:  .  ALPRAZolam (XANAX) 0.5 MG tablet, Take 2 tablets approximately 45 minutes prior to the MRI study, take a third tablet if needed., Disp: 3 tablet, Rfl: 0 .  aspirin 325 MG tablet, Take 325 mg by mouth as needed., Disp: , Rfl:  .  benazepril (LOTENSIN) 40 MG tablet, Take 40 mg by mouth daily. , Disp: , Rfl: 4 .  Cholecalciferol (VITAMIN D3) 5000 UNITS TABS, Take 5,000 Units by mouth daily., Disp: , Rfl:  .  eletriptan (RELPAX) 40 MG tablet, TAKE 1 TABLET (40 MG TOTAL) BY MOUTH AS NEEDED FOR MIGRAINE. MAY REPEAT IN 2 HOURS IF HEADACHE PERSISTS OR RECURS., Disp: 24 tablet, Rfl: 5 .  famotidine (PEPCID) 20 MG tablet, Take 20 mg by mouth as needed. , Disp: , Rfl:  .  Glatiramer Acetate 40 MG/ML SOSY, Inject 40 mg into the skin 3 (three) times a week., Disp: 12 mL, Rfl: 12 .  hydrochlorothiazide (HYDRODIURIL) 50 MG tablet, Take 25 mg by mouth daily. , Disp: , Rfl: 0 .  hydrOXYzine (ATARAX/VISTARIL) 25 MG tablet, Take 1 tablet (25 mg total) by mouth every 6 (six) hours as needed (for itching)., Disp: 30 tablet, Rfl: 5   Allergies  Allergen Reactions  . Niacin Anaphylaxis, Hives and Itching     Social History   Occupational History  . Occupation: Equities trader  Tobacco Use  . Smoking status: Never Smoker  . Smokeless tobacco: Never Used  Substance and Sexual Activity  . Alcohol use: No  . Drug use: No  . Sexual activity: Not on file     Family History  Adopted: Yes  Problem Relation Age of  Onset  . Lupus Sister   . Cancer Sister   . Heart attack Sister   . Heart attack Sister   . Breast cancer Neg Hx      Immunization History  Administered Date(s) Administered  . PFIZER SARS-COV-2 Vaccination 11/15/2019, 12/09/2019    OBJECTIVE: There were no vitals filed for this visit.  Vascular Examination: Dorsalis pedis and posterior tibial pulses palpable bilaterally.  Capillary refill time immediate x 10 digits.  No pedal edema b/l.  No varicosities b/l.  Skin temperature gradient WNL b/l.  Dermatological: Normal skin turgor, texture and tone b/l.  No skin eruptions noted b/l.  Nails 1-5 b/l normal b/l.  Musculoskeletal: Muscle strength 5/5 to all LE muscle groups b/l.  Negative Tinel's sign b/l.  Pain on palpation noted medial tubercle right heel.  Neurological: Epicritic sensation grossly intact b/l and symmetrically with 10 gram monofilament.  Vibratory sensation intact b/l.  Xray findings right foot No gas in tissues right foot. Plantar calcaneal spur noted right foot.  ASSESSMENT: 1. Plantar fasciitis of right foot    PLAN: 1. Patient examined today. 2. Xrays taken and discussed findings with patient. 3. Options for plantar fasciitis discussed included stretching exercises, night splint, plantar fascial strap, oral anti-inflammatories, icing, protection, local steroid injection, and arch  4. Dispensed plantar fascial brace for right foot and she is to wear daily. 5. Dispensed Power Steps orthotics. 6. Discussed her icing and stretching the plantar fascia with a frozen water bottle or can good. 7. Dispensed literature on stretching exercises.  Return in about 3 weeks (around 08/14/2020).  Marzetta Board, DPM

## 2020-08-03 ENCOUNTER — Ambulatory Visit
Admission: RE | Admit: 2020-08-03 | Discharge: 2020-08-03 | Disposition: A | Discharge status: Home / Self Care | Payer: 59 | Source: Ambulatory Visit | Attending: Obstetrics and Gynecology | Admitting: Obstetrics and Gynecology

## 2020-08-03 ENCOUNTER — Other Ambulatory Visit: Payer: Self-pay

## 2020-08-03 DIAGNOSIS — E2839 Other primary ovarian failure: Secondary | ICD-10-CM

## 2020-08-03 DIAGNOSIS — M8580 Other specified disorders of bone density and structure, unspecified site: Secondary | ICD-10-CM | POA: Diagnosis not present

## 2020-08-05 DIAGNOSIS — E119 Type 2 diabetes mellitus without complications: Secondary | ICD-10-CM | POA: Diagnosis not present

## 2020-08-05 DIAGNOSIS — Z049 Encounter for examination and observation for unspecified reason: Secondary | ICD-10-CM | POA: Diagnosis not present

## 2020-08-05 DIAGNOSIS — I10 Essential (primary) hypertension: Secondary | ICD-10-CM | POA: Diagnosis not present

## 2020-08-06 DIAGNOSIS — H40013 Open angle with borderline findings, low risk, bilateral: Secondary | ICD-10-CM | POA: Diagnosis not present

## 2020-08-06 DIAGNOSIS — E119 Type 2 diabetes mellitus without complications: Secondary | ICD-10-CM | POA: Diagnosis not present

## 2020-08-06 DIAGNOSIS — H2513 Age-related nuclear cataract, bilateral: Secondary | ICD-10-CM | POA: Diagnosis not present

## 2020-08-06 DIAGNOSIS — H04123 Dry eye syndrome of bilateral lacrimal glands: Secondary | ICD-10-CM | POA: Diagnosis not present

## 2020-08-09 DIAGNOSIS — R0600 Dyspnea, unspecified: Secondary | ICD-10-CM | POA: Insufficient documentation

## 2020-08-09 NOTE — Progress Notes (Signed)
Cardiology Office Note   Date:  08/10/2020   ID:  Katherine Yu, DOB February 20, 1951, MRN [id number]  PCP:  Leonard Downing, MD  Cardiologist:   Minus Breeding, MD   Chief Complaint  Patient presents with  . Shortness of Breath    History of Present Illness: Katherine Yu is a 69 y.o. female who is referred by Leonard Downing, MD for evaluation of chest pain.  The patient was seen in 2010 for chest discomfort and had a negative POET (Plain Old Exercise Treadmill) by her report although I do not have these results.  I saw her in June.  She had chest dyspnea.   She had a negative POET (Plain Old Exercise Treadmill).    Since I last saw her she has been diagnosed with diabetes.  She lost 20 pounds with diet.  Her hemoglobin A1c was 6.6 but is now down to 5.6.  She said all of her symptoms such as her dyspnea and chest discomfort have disappeared.  She had previously described it as the "MS Hug" but she no longer has this.  She does walk with a cane because of some fasciitis and her multiple sclerosis.  However, with her level of activity she denies any cardiovascular symptoms. The patient denies any new symptoms such as chest discomfort, neck or arm discomfort. There has been no new shortness of breath, PND or orthopnea. There have been no reported palpitations, presyncope or syncope.    Past Medical History:  Diagnosis Date  . ARTHRITIS 02/20/2009   Qualifier: Diagnosis of  By: Ronne Binning    . CHEST PAIN-PRECORDIAL 03/02/2009   Qualifier: Diagnosis of  By: Verl Blalock, MD, Delanna Ahmadi Chronic insomnia   . Degenerative arthritis   . DEPRESSION 02/20/2009   Qualifier: Diagnosis of  By: Ronne Binning    . Dyslipidemia   . FATIGUE 02/20/2009   Qualifier: Diagnosis of  By: Ronne Binning    . GERD 02/20/2009   Qualifier: Diagnosis of  By: Ronne Binning    . History of lower leg fracture   . History of peptic ulcer   . History of seizures     In childhood  . HYPERLIPIDEMIA-MIXED 05/04/2010   Qualifier: Diagnosis of  By: Verl Blalock, MD, Delanna Ahmadi Hypertension   . Migraine   . Mild obesity   . MULTIPLE SCLEROSIS 02/20/2009   Qualifier: Diagnosis of  By: Orville Govern CMA, Arbie Cookey    . PERS HX NONCOMPLIANCE W/MED TX PRS HAZARDS HLTH 05/04/2010   Qualifier: Diagnosis of  By: Verl Blalock, MD, Delanna Ahmadi Temporomandibular joint disease     Past Surgical History:  Procedure Laterality Date  . APPENDECTOMY    . GALLBLADDER SURGERY    . metal plate resection     skull  . pilonidal cyst       Current Outpatient Medications  Medication Sig Dispense Refill  . aspirin 325 MG tablet Take 325 mg by mouth as needed.    . benazepril (LOTENSIN) 40 MG tablet Take 40 mg by mouth daily.   4  . Cholecalciferol (VITAMIN D3) 5000 UNITS TABS Take 5,000 Units by mouth daily.    Marland Kitchen eletriptan (RELPAX) 40 MG tablet TAKE 1 TABLET (40 MG TOTAL) BY MOUTH AS NEEDED FOR MIGRAINE. MAY REPEAT IN 2 HOURS IF HEADACHE PERSISTS OR RECURS. 24 tablet 5  . famotidine (PEPCID) 20 MG tablet Take 20  mg by mouth as needed.     Marland Kitchen Glatiramer Acetate 40 MG/ML SOSY Inject 40 mg into the skin 3 (three) times a week. 12 mL 12  . hydrochlorothiazide (HYDRODIURIL) 50 MG tablet Take 25 mg by mouth daily.   0  . hydrOXYzine (ATARAX/VISTARIL) 25 MG tablet Take 1 tablet (25 mg total) by mouth every 6 (six) hours as needed (for itching). 30 tablet 5   No current facility-administered medications for this visit.    Allergies:   Niacin   ROS:  Please see the history of present illness.   Otherwise, review of systems are positive for none.   All other systems are reviewed and negative.    PHYSICAL EXAM: VS:  BP 120/70   Pulse 80   Ht 5' 5.5" (1.664 m)   Wt 157 lb (71.2 kg)   SpO2 98%   BMI 25.73 kg/m  , BMI Body mass index is 25.73 kg/m. GENERAL:  Well appearing NECK:  No jugular venous distention, waveform within normal limits, carotid upstroke brisk and symmetric, no  bruits, no thyromegaly LUNGS:  Clear to auscultation bilaterally CHEST:  Unremarkable HEART:  PMI not displaced or sustained,S1 and S2 within normal limits, no S3, no S4, no clicks, no rubs, no murmurs ABD:  Flat, positive bowel sounds normal in frequency in pitch, no bruits, no rebound, no guarding, no midline pulsatile mass, no hepatomegaly, no splenomegaly EXT:  2 plus pulses throughout, no edema, no cyanosis no clubbing   EKG:  EKG is not ordered today.  Recent Labs: 10/31/2019: ALT 35; BUN 21; Creatinine, Ser 0.94; Potassium 4.9; Sodium 142    Lipid Panel    Component Value Date/Time   CHOL 203 (H) 02/10/2020 0936   TRIG 129 02/10/2020 0936   HDL 43 02/10/2020 0936   CHOLHDL 4.7 (H) 02/10/2020 0936   CHOLHDL 4.6 CALC 01/17/2008 1250   VLDL 27 01/17/2008 1250   LDLCALC 137 (H) 02/10/2020 0936      Wt Readings from Last 3 Encounters:  08/10/20 157 lb (71.2 kg)  05/07/20 178 lb 1.6 oz (80.8 kg)  05/05/20 180 lb (81.6 kg)      Other studies Reviewed: Additional studies/ records that were reviewed today include: None. Review of the above records demonstrates:  Please see elsewhere in the note.     ASSESSMENT AND PLAN:  CHEST PAIN:    She is no longer having chest discomfort.  No further cardiovascular risk factors.  HTN: Her blood pressure is controlled.  No change in therapy.    RISK REDUCTION: Her LDL was elevated at the last visit and her risk score was 15.9%.  I suggested a satin.  However, she did not do this because of previous muscle aches she believes on Lipitor.  Now that her diet is changed we are going to repeat a lipid profile.  She would consent to starting a statin if her score is still elevated particularly in the face of her new diagnosis of diabetes.  I would likely start 80 mg of pravastatin.   DM: Her A1c most recently was 5.6 but it was 6.6 in August.  She continues on the therapy as above.  We talked further about diet.   Current medicines are  reviewed at length with the patient today.  The patient does not have concerns regarding medicines.  The following changes have been made:  None  Labs/ tests ordered today include:   Orders Placed This Encounter  Procedures  . Lipid panel  Disposition:   FU with me as needed.   Signed, Minus Breeding, MD  08/10/2020 8:54 AM    [facility]

## 2020-08-10 ENCOUNTER — Ambulatory Visit: Payer: 59 | Admitting: Cardiology

## 2020-08-10 ENCOUNTER — Telehealth: Payer: Self-pay | Admitting: Neurology

## 2020-08-10 ENCOUNTER — Other Ambulatory Visit: Payer: Self-pay

## 2020-08-10 ENCOUNTER — Encounter: Payer: Self-pay | Admitting: Cardiology

## 2020-08-10 VITALS — BP 120/70 | HR 80 | Ht 65.5 in | Wt 157.0 lb

## 2020-08-10 DIAGNOSIS — E785 Hyperlipidemia, unspecified: Secondary | ICD-10-CM | POA: Diagnosis not present

## 2020-08-10 DIAGNOSIS — R0600 Dyspnea, unspecified: Secondary | ICD-10-CM | POA: Diagnosis not present

## 2020-08-10 DIAGNOSIS — I10 Essential (primary) hypertension: Secondary | ICD-10-CM | POA: Diagnosis not present

## 2020-08-10 NOTE — Patient Instructions (Signed)
Medication Instructions:  No changes *If you need a refill on your cardiac medications before your next appointment, please call your pharmacy*  Lab Work: Your physician recommends that you return for lab work today (Lipids) If you have labs (blood work) drawn today and your tests are completely normal, you will receive your results only by: Marland Kitchen MyChart Message (if you have MyChart) OR . A paper copy in the mail If you have any lab test that is abnormal or we need to change your treatment, we will call you to review the results.  Testing/Procedures: None ordered this visit  Follow-Up: At [facility], you and your health needs are our priority.  As part of our continuing mission to provide you with exceptional heart care, we have created designated Provider Care Teams.  These Care Teams include your primary Cardiologist (physician) and Advanced Practice Providers (APPs -  Physician Assistants and Nurse Practitioners) who all work together to provide you with the care you need, when you need it.   Other Instructions FOLLOW UP AS NEEDED

## 2020-08-10 NOTE — Telephone Encounter (Signed)
This patient was seen with Dr. Kathlen Mody on 06 August 2020.  The patient is on Copaxone for her multiple sclerosis.  The patient has some posterior vitreous detachment bilaterally macula appeared to be relatively unremarkable on both sides.  Patient also has a history of diabetes.

## 2020-08-11 LAB — LIPID PANEL
Chol/HDL Ratio: 3.5 ratio (ref 0.0–4.4)
Cholesterol, Total: 146 mg/dL (ref 100–199)
HDL: 42 mg/dL (ref >39)
LDL Chol Calc (NIH): 88 mg/dL (ref 0–99)
Triglycerides: 85 mg/dL (ref 0–149)
VLDL Cholesterol Cal: 16 mg/dL (ref 5–40)

## 2020-08-14 ENCOUNTER — Encounter: Payer: Self-pay | Admitting: Podiatry

## 2020-08-14 ENCOUNTER — Ambulatory Visit: Payer: 59 | Admitting: Podiatry

## 2020-08-14 ENCOUNTER — Other Ambulatory Visit: Payer: Self-pay

## 2020-08-14 DIAGNOSIS — G35 Multiple sclerosis: Secondary | ICD-10-CM | POA: Diagnosis not present

## 2020-08-14 DIAGNOSIS — M722 Plantar fascial fibromatosis: Secondary | ICD-10-CM

## 2020-08-14 DIAGNOSIS — M7731 Calcaneal spur, right foot: Secondary | ICD-10-CM

## 2020-08-17 DIAGNOSIS — N814 Uterovaginal prolapse, unspecified: Secondary | ICD-10-CM | POA: Diagnosis not present

## 2020-08-17 DIAGNOSIS — R32 Unspecified urinary incontinence: Secondary | ICD-10-CM | POA: Diagnosis not present

## 2020-08-20 ENCOUNTER — Encounter: Payer: 59 | Attending: Family Medicine | Admitting: Dietician

## 2020-08-20 ENCOUNTER — Encounter: Payer: Self-pay | Admitting: Dietician

## 2020-08-20 ENCOUNTER — Other Ambulatory Visit: Payer: Self-pay

## 2020-08-20 DIAGNOSIS — E119 Type 2 diabetes mellitus without complications: Secondary | ICD-10-CM | POA: Diagnosis not present

## 2020-08-20 NOTE — Patient Instructions (Signed)
Great job on the changes that you have made! Continue mindful meal choices Continue to stay active.

## 2020-08-20 NOTE — Progress Notes (Addendum)
Patient attended Diabetes Core Classes 1-3 between 05/07/2020 and 05/21/2020 at [facility]. The purpose of the meeting today is to review information learned during those classes as well as review patient application and goals.   What are one or two positive things that you are doing right now to manage your diabetes?  She is walking more (but now has a bone spur and plantar facitis), she is controlling her calories and is losing weight 185 lbs 04/28/2020, 178.1 lbs 05/2020 and now down to 153.4 lbs 08/20/2020.  She is being more mindful about food choices.  She is aiming to follow a 1500 calorie diet.  What is the hardest part about your diabetes right now, causing you the most concern, or is the most worrisome to you about your diabetes?  She is hungry.  What questions do you have today?  Herbalife supplement States that she no longer has the indigestion at night.  (Used to eat in the middle of the night when she would take the dog out, eat late or eat richer foods for dinner.) No longer having chest pain and tachycardia, lower cholesterol since changing her diet and losing weight.  Have you participated in any diabetes support group?  No.  She is on the list but forgets.  I added her to the e-mail list.  History: Type 2 Diabetes diagnosed 04/2020, MS, HTN, HLD A1C:  5.6%08/05/2020 decreased from 6.6% 04/24/2020 Medications include:  None for diabetes Sleep:  Poor due to bladder issues.  "never sleeps well" Weight:  Lost from 184 lbs 04/28/2020 to 153 lbs 08/20/2020 Blood Glucose:  Not needed at this time    Social History:  Lives with husband and son.  She does most of the shopping and cooking. She walks her dog daily.  24 hour diet recall: She has been counting calories and journaling her intake Breakfast:  Oatmeal, 1/2 cup blueberries, lite greek yogurt, boiled egg, 9 almonds OR 2 boiled eggs, small banana, herbalife shake and herbalife tea Snack:  none Lunch:   Baked salmon, turnip greens, vinegar slaw, 1/2 cup grapes  Snack:  fruit Dinner:  Occasional [employer] BBQ OR salad, tuna salad, 1/2 cup baked beans OR venison tacos, lettuce, tomatoes, tortilla OR Healthy Choice Power bowl, lite yogurt, grapes Snack:  occasional Beverages:  Water, black decaf coffee, herbalife tea or herbal tea   Specific focus but not limited to the following: . Discussion of strategies to manage stress, psychosocial issues, and other obstacles to diabetes management. . Review of prevention, detection, and treatment of long-term complications.   . Encouraged patient to continue daily walking. . Encouraged continue mindfulness  Continuing Goals: She wants to lose to 144 lbs and maintain. Stay active. Continue Mindful meal choices.  Future Follow up:  6 months

## 2020-08-20 NOTE — Progress Notes (Signed)
SUBJECTIVE: Katherine Yu presents to clinic on today for follow up plantar fasciitis of the right foot. Treatments on last visit include plantar fascial brace and Power Step Arch Supports. She notes improvement in symptoms. She does relate discomfort using plantar fascial brace and needs clarification on exactly wear to apply PF brace.  Current Outpatient Medications on File Prior to Visit  Medication Sig Dispense Refill  . Acetaminophen 500 MG capsule 1 capsule as needed    . aspirin 325 MG tablet Take 325 mg by mouth as needed.    . benazepril (LOTENSIN) 40 MG tablet Take 40 mg by mouth daily.   4  . Cholecalciferol (VITAMIN D-3) 5000 UNIT/ML LIQD 1 tablet    . Cholecalciferol (VITAMIN D3) 5000 UNITS TABS Take 5,000 Units by mouth daily.    . Cranberry 450 MG TABS 1 tablet with meals    . eletriptan (RELPAX) 40 MG tablet TAKE 1 TABLET (40 MG TOTAL) BY MOUTH AS NEEDED FOR MIGRAINE. MAY REPEAT IN 2 HOURS IF HEADACHE PERSISTS OR RECURS. 24 tablet 5  . famotidine (PEPCID) 20 MG tablet Take 20 mg by mouth as needed.     Marland Kitchen Glatiramer Acetate 40 MG/ML SOSY Inject 40 mg into the skin 3 (three) times a week. 12 mL 12  . hydrochlorothiazide (HYDRODIURIL) 50 MG tablet Take 25 mg by mouth daily.   0  . hydrOXYzine (ATARAX/VISTARIL) 25 MG tablet Take 1 tablet (25 mg total) by mouth every 6 (six) hours as needed (for itching). 30 tablet 5  . PFIZER-BIONTECH COVID-19 VACC 30 MCG/0.3ML injection     . raNITIdine HCl (ACID REDUCER PO) 1 tablet as needed     No current facility-administered medications on file prior to visit.    Allergies  Allergen Reactions  . Niacin Anaphylaxis, Hives, Itching and Rash     OBJECTIVE: There were no vitals filed for this visit.  Vascular: Dorsalis pedis and posterior tibial pulses palpable bilaterally. Capillary refill time immediate x 10 digits. No pedal edema b/l. No varicosities b/l. Skin temperature gradient WNL b/l.  Dermatological: Normal skin  turgor, texture and tone b/l No skin eruptions noted b/l Nails 1-5 b/l normal b/l  Neurological: Epicritic sensation grossly intact b/l and symmetrically with 10 gram monofilament. Vibratory sensation intact b/l.  Musculoskeletal: Muscle strength 5/5 to all LE muscle groups b/l Negative Tinel's sign b/l Minimal POP medial tubercle right heel.   ASSESSMENT: 1. Plantar fasciitis right foot, improving with current treatment plan 2. Plantar calcaneal spur right foot 3. Multiple Sclerosis  PLAN: 1. Patient examined today. 2. Patient's symptoms improved. 3. Demontrated application of plantar fascial brace. 4. She states she will get a new pair of shoes. 5. She is apprehensive regarding steroid injections due to prior reaction after steroid injection of her knee. 6. Options for plantar fasciitis discussed included stretching exercises, night splint, plantar fascial strap, oral anti-inflammatories, icing, protection, local steroid injection, and arch supports/custom orthotics.  7. Continue stretching exercises, icing, plantar fascial brace and Power Step arch supports. 8. Follow up 3-4 weeks.

## 2020-08-24 ENCOUNTER — Other Ambulatory Visit (HOSPITAL_COMMUNITY): Payer: Self-pay | Admitting: Nurse Practitioner

## 2020-08-26 DIAGNOSIS — N814 Uterovaginal prolapse, unspecified: Secondary | ICD-10-CM | POA: Diagnosis not present

## 2020-08-26 DIAGNOSIS — R32 Unspecified urinary incontinence: Secondary | ICD-10-CM | POA: Diagnosis not present

## 2020-09-08 ENCOUNTER — Ambulatory Visit: Payer: 59 | Admitting: Podiatry

## 2020-09-08 ENCOUNTER — Other Ambulatory Visit: Payer: Self-pay

## 2020-09-08 DIAGNOSIS — M7731 Calcaneal spur, right foot: Secondary | ICD-10-CM

## 2020-09-08 DIAGNOSIS — N814 Uterovaginal prolapse, unspecified: Secondary | ICD-10-CM | POA: Insufficient documentation

## 2020-09-08 DIAGNOSIS — R32 Unspecified urinary incontinence: Secondary | ICD-10-CM | POA: Insufficient documentation

## 2020-09-08 DIAGNOSIS — M722 Plantar fascial fibromatosis: Secondary | ICD-10-CM | POA: Diagnosis not present

## 2020-09-08 DIAGNOSIS — M79671 Pain in right foot: Secondary | ICD-10-CM

## 2020-09-08 DIAGNOSIS — E2839 Other primary ovarian failure: Secondary | ICD-10-CM | POA: Insufficient documentation

## 2020-09-08 DIAGNOSIS — N8111 Cystocele, midline: Secondary | ICD-10-CM | POA: Insufficient documentation

## 2020-09-08 DIAGNOSIS — N816 Rectocele: Secondary | ICD-10-CM | POA: Insufficient documentation

## 2020-09-08 NOTE — Progress Notes (Signed)
SUBJECTIVE: Katherine Yu presents to clinic on today for follow up plantar fasciitis of the right foot.  Treatments on last visit include continue stretching exercises, icing, Powerstep Arch supports and new shoe gear change.  Patient has purchased a new pair of Shon [employer]aton sneakers and Occupational psychologist. She states she is pleased with her progress and relates pain level of 2/10.  She does not like the plantar fascial brace as it causes pain in her ankle. She has attempted use of this multiple times and experiences pain with the brace.  Current Outpatient Medications on File Prior to Visit  Medication Sig Dispense Refill  . Acetaminophen 500 MG capsule 1 capsule as needed    . aspirin 325 MG tablet Take 325 mg by mouth as needed.    . benazepril (LOTENSIN) 40 MG tablet Take 40 mg by mouth daily.   4  . Cholecalciferol (VITAMIN D-3) 5000 UNIT/ML LIQD 1 tablet    . Cholecalciferol (VITAMIN D3) 5000 UNITS TABS Take 5,000 Units by mouth daily.    . Cranberry 450 MG TABS 1 tablet with meals    . eletriptan (RELPAX) 40 MG tablet TAKE 1 TABLET (40 MG TOTAL) BY MOUTH AS NEEDED FOR MIGRAINE. MAY REPEAT IN 2 HOURS IF HEADACHE PERSISTS OR RECURS. 24 tablet 5  . famotidine (PEPCID) 20 MG tablet Take 20 mg by mouth as needed.     Marland Kitchen Glatiramer Acetate 40 MG/ML SOSY Inject 40 mg into the skin 3 (three) times a week. 12 mL 12  . hydrochlorothiazide (HYDRODIURIL) 50 MG tablet Take 25 mg by mouth daily.   0  . hydrOXYzine (ATARAX/VISTARIL) 25 MG tablet Take 1 tablet (25 mg total) by mouth every 6 (six) hours as needed (for itching). 30 tablet 5  . hydrOXYzine (VISTARIL) 25 MG capsule 1 capsule as needed    . interferon beta-1a (REBIF) 44 MCG/0.5ML SOSY injection 0.5 ml    . PFIZER-BIONTECH COVID-19 VACC 30 MCG/0.3ML injection     . raNITIdine HCl (ACID REDUCER PO) 1 tablet as needed     No current facility-administered medications on file prior to visit.    Allergies  Allergen Reactions  .  Niacin Anaphylaxis, Hives, Itching and Rash     OBJECTIVE: There were no vitals filed for this visit.  Vascular: Dorsalis pedis and posterior tibial pulses palpable bilaterally. Capillary refill time immediate x 10 digits. No pedal edema b/l. No varicosities b/l. Skin temperature gradient WNL b/l.  Dermatological: Normal skin turgor, texture and tone b/l. No skin eruptions noted b/l. Nails 1-5 b/l normal b/l.  Neurological: Epicritic sensation grossly intact b/l and symmetrically with 10 gram monofilament. Vibratory sensation intact b/l.  Musculoskeletal: Muscle strength 5/5 to all LE muscle groups b/l. Negative Tinel's sign b/l. Slight tenderness on palpation noted medial tubercle right heel.  ASSESSMENT: 1. Plantar fasciitis right foot 2. Heel spur right foot  3. Pain in foot right LE  PLAN: 1. Patient examined today. 2. Patient's symptoms have improved with current treatment plan. 3. Continue stretching exercises, icing, new shoe gear,  Power Steps arch supports daily 4. Follow up prn

## 2020-09-09 DIAGNOSIS — N814 Uterovaginal prolapse, unspecified: Secondary | ICD-10-CM | POA: Diagnosis not present

## 2020-09-09 DIAGNOSIS — R32 Unspecified urinary incontinence: Secondary | ICD-10-CM | POA: Diagnosis not present

## 2020-09-10 ENCOUNTER — Encounter: Payer: Self-pay | Admitting: Podiatry

## 2020-09-16 DIAGNOSIS — N814 Uterovaginal prolapse, unspecified: Secondary | ICD-10-CM | POA: Diagnosis not present

## 2020-09-16 DIAGNOSIS — R32 Unspecified urinary incontinence: Secondary | ICD-10-CM | POA: Diagnosis not present

## 2020-09-24 DIAGNOSIS — N814 Uterovaginal prolapse, unspecified: Secondary | ICD-10-CM | POA: Diagnosis not present

## 2020-09-24 DIAGNOSIS — R32 Unspecified urinary incontinence: Secondary | ICD-10-CM | POA: Diagnosis not present

## 2020-10-01 DIAGNOSIS — N814 Uterovaginal prolapse, unspecified: Secondary | ICD-10-CM | POA: Diagnosis not present

## 2020-10-01 DIAGNOSIS — R32 Unspecified urinary incontinence: Secondary | ICD-10-CM | POA: Diagnosis not present

## 2020-10-14 DIAGNOSIS — N814 Uterovaginal prolapse, unspecified: Secondary | ICD-10-CM | POA: Diagnosis not present

## 2020-10-14 DIAGNOSIS — N3941 Urge incontinence: Secondary | ICD-10-CM | POA: Insufficient documentation

## 2020-10-14 DIAGNOSIS — K469 Unspecified abdominal hernia without obstruction or gangrene: Secondary | ICD-10-CM | POA: Diagnosis not present

## 2020-10-14 DIAGNOSIS — G35 Multiple sclerosis: Secondary | ICD-10-CM | POA: Diagnosis not present

## 2020-10-14 DIAGNOSIS — N398 Other specified disorders of urinary system: Secondary | ICD-10-CM | POA: Diagnosis not present

## 2020-10-19 ENCOUNTER — Ambulatory Visit (HOSPITAL_BASED_OUTPATIENT_CLINIC_OR_DEPARTMENT_OTHER): Payer: 59 | Admitting: Pharmacist

## 2020-10-19 ENCOUNTER — Other Ambulatory Visit: Payer: Self-pay

## 2020-10-19 DIAGNOSIS — Z79899 Other long term (current) drug therapy: Secondary | ICD-10-CM

## 2020-10-19 NOTE — Progress Notes (Signed)
S: Patient contacted today for follow up with the [facility].  Patient is currently taking Copaxone for MS. Patient is managed by Dr. Jannifer Franklin for this. She reports that she is tolerating the medication well.  Adherence: denies any missed doses  Dosing: Copaxone 40 mg 3 times weekly  Dose adjustments: Hepatic: none needed  Drug-drug interactions: none  Monitoring: CBC: WNL LFTs: WNL from 08/05/2020  Pain/weakness: denies GI upset: denies Hypersensitivity: reports small injection site nodules that occur with injections but this is normal for her.   O:    Lab Results  Component Value Date   WBC 4.8 04/23/2019   HGB 14.6 04/23/2019   HCT 42.2 04/23/2019   MCV 87 04/23/2019   PLT 243 04/23/2019      Chemistry      Component Value Date/Time   NA 142 10/31/2019 1107   K 4.9 10/31/2019 1107   CL 101 10/31/2019 1107   CO2 18 (L) 10/31/2019 1107   BUN 21 10/31/2019 1107   CREATININE 0.94 10/31/2019 1107      Component Value Date/Time   CALCIUM 10.4 (H) 10/31/2019 1107   ALKPHOS 95 10/31/2019 1107   AST 28 10/31/2019 1107   ALT 35 (H) 10/31/2019 1107   BILITOT 0.3 10/31/2019 1107       A/P: 1. Medication review: Patient on Copaxone for MS and is tolerating the medication well. Provided education on medication including adverse effects, such as GI upset (nausea) and hypersensitivity reactions. No recommendations for any changes. Patient to follow up with Dr. Jannifer Franklin as directed.   Benard Halsted, PharmD, [facility], [place] [phone]

## 2020-11-03 NOTE — Progress Notes (Signed)
PATIENT: Katherine Yu DOB: 1951-02-25  REASON FOR VISIT: follow up HISTORY FROM: patient  HISTORY OF PRESENT ILLNESS: Today 11/04/20  Katherine Yu is a 70 year old female with history of MS.  She has primarily involvement of the brain.  She has a gait disturbance, uses a cane.  She is on Copaxone. When last seen in August, complained of weakness and paresthesia of the lower extremities. MRI of the cervical, thoracic, lumbar spine did not show any explanation for lower extremity paresthesias. Sees urology for incontinence, Dr. Zigmund Daniel, planning for UDT (urodynamic testing).  Has not wanted any medications for OAB. She has pelvic organ prolapse. She does not have any complaints or concerns she wants to discuss today.  Needs refill on her Copaxone, labs.  Here today for evaluation unaccompanied.  HISTORY 05/05/2020 Dr. Jannifer Franklin: Katherine Yu is a 70 year old right-handed white female with a history of multiple sclerosis. The patient primarily has brain involvement with this. She has a gait disturbance, she uses a cane for ambulation. She does have a history of migraine headaches as well. She has been on Copaxone, and overall she has tolerated this fairly well. More recently, she was diagnosed with diabetes and she is now trying to exercise more and get on a diabetic diet without going on medications. The patient over the last 2 or 3 months she indicated that she had a fairly sudden onset of weakness in both legs associated with some discomfort and paresthesias that go down into the thighs bilaterally. She has not noted any significant change in bladder or bowel control. She does have chronic issues with urinary frequency and urgency. She has not had any falls recently. She still uses a cane for ambulation. She improved after about 3 days of onset of symptoms but never got back to baseline. She never called our office regarding this. She initially had wanted to go off of Copaxone when seen 6 months  ago, but she has decided to stay with the medication.  REVIEW OF SYSTEMS: Out of a complete 14 system review of symptoms, the patient complains only of the following symptoms, and all other reviewed systems are negative.  See HPI  ALLERGIES: Allergies  Allergen Reactions  . Niacin Anaphylaxis, Hives, Itching and Rash    HOME MEDICATIONS: Outpatient Medications Prior to Visit  Medication Sig Dispense Refill  . aspirin 325 MG tablet Take 325 mg by mouth as needed.    . benazepril (LOTENSIN) 40 MG tablet Take 40 mg by mouth daily.   4  . Cholecalciferol (VITAMIN D3) 5000 UNITS TABS Take 5,000 Units by mouth daily.    Marland Kitchen eletriptan (RELPAX) 40 MG tablet TAKE 1 TABLET (40 MG TOTAL) BY MOUTH AS NEEDED FOR MIGRAINE. MAY REPEAT IN 2 HOURS IF HEADACHE PERSISTS OR RECURS. 24 tablet 5  . famotidine (PEPCID) 20 MG tablet Take 20 mg by mouth as needed.     . hydrochlorothiazide (HYDRODIURIL) 50 MG tablet Take 25 mg by mouth daily.   0  . hydrOXYzine (VISTARIL) 25 MG capsule 1 capsule as needed    . PFIZER-BIONTECH COVID-19 VACC 30 MCG/0.3ML injection     . Glatiramer Acetate 40 MG/ML SOSY Inject 40 mg into the skin 3 (three) times a week. 12 mL 12  . Acetaminophen 500 MG capsule 1 capsule as needed    . Cholecalciferol (VITAMIN D-3) 5000 UNIT/ML LIQD 1 tablet    . Cranberry 450 MG TABS 1 tablet with meals    . hydrOXYzine (ATARAX/VISTARIL)  25 MG tablet Take 1 tablet (25 mg total) by mouth every 6 (six) hours as needed (for itching). 30 tablet 5  . interferon beta-1a (REBIF) 44 MCG/0.5ML SOSY injection 0.5 ml    . raNITIdine HCl (ACID REDUCER PO) 1 tablet as needed     No facility-administered medications prior to visit.    PAST MEDICAL HISTORY: Past Medical History:  Diagnosis Date  . ARTHRITIS 02/20/2009   Qualifier: Diagnosis of  By: Ronne Binning    . CHEST PAIN-PRECORDIAL 03/02/2009   Qualifier: Diagnosis of  By: Verl Blalock, MD, Delanna Ahmadi Chronic insomnia   . Degenerative  arthritis   . DEPRESSION 02/20/2009   Qualifier: Diagnosis of  By: Ronne Binning    . Dyslipidemia   . FATIGUE 02/20/2009   Qualifier: Diagnosis of  By: Ronne Binning    . GERD 02/20/2009   Qualifier: Diagnosis of  By: Ronne Binning    . History of lower leg fracture   . History of peptic ulcer   . History of seizures    In childhood  . HYPERLIPIDEMIA-MIXED 05/04/2010   Qualifier: Diagnosis of  By: Verl Blalock, MD, Delanna Ahmadi Hypertension   . Migraine   . Mild obesity   . MULTIPLE SCLEROSIS 02/20/2009   Qualifier: Diagnosis of  By: Orville Govern CMA, Arbie Cookey    . PERS HX NONCOMPLIANCE W/MED TX PRS HAZARDS HLTH 05/04/2010   Qualifier: Diagnosis of  By: Verl Blalock, MD, Delanna Ahmadi Temporomandibular joint disease     PAST SURGICAL HISTORY: Past Surgical History:  Procedure Laterality Date  . APPENDECTOMY    . GALLBLADDER SURGERY    . metal plate resection     skull  . pilonidal cyst      FAMILY HISTORY: Family History  Adopted: Yes  Problem Relation Age of Onset  . Lupus Sister   . Cancer Sister   . Heart attack Sister   . Heart attack Sister   . Breast cancer Neg Hx     SOCIAL HISTORY: Social History   Socioeconomic History  . Marital status: Married    Spouse name: Not on file  . Number of children: 3  . Years of education: Masters  . Highest education level: Not on file  Occupational History  . Occupation: Equities trader  Tobacco Use  . Smoking status: Never Smoker  . Smokeless tobacco: Never Used  Substance and Sexual Activity  . Alcohol use: No  . Drug use: No  . Sexual activity: Not on file  Other Topics Concern  . Not on file  Social History Narrative   Patient is right handed.   Patient drinks 1 cup caffeine daily.   Lives with husband and son.    Social Determinants of Health   Financial Resource Strain: Not on file  Food Insecurity: Not on file  Transportation Needs: Not on file  Physical Activity: Not on file  Stress: Not on file   Social Connections: Not on file  Intimate Partner Violence: Not on file   PHYSICAL EXAM  Vitals:   11/04/20 0739  BP: 104/69  Pulse: 85  Weight: 152 lb (68.9 kg)  Height: 5\' 5"  (1.651 m)   Body mass index is 25.29 kg/m.  Generalized: Well developed, in no acute distress  Neurological examination  Mentation: Alert oriented to time, place, history taking. Follows all commands speech and language fluent, not receptive to history gathering  Cranial nerve II-XII: Pupils  were equal round reactive to light. Extraocular movements were full, visual field were full on confrontational test. Facial sensation and strength were normal. Head turning and shoulder shrug were normal and symmetric. Motor: Good strength all extremities Sensory: Sensory testing is intact to soft touch on all 4 extremities. No evidence of extinction is noted.  Coordination: Cerebellar testing reveals good finger-nose-finger and heel-to-shin bilaterally.  Gait and station: Gait is wide-based, slightly unsteady, but walks fairly well with a cane, swift pace in the hallway Reflexes: Deep tendon reflexes are symmetric   DIAGNOSTIC DATA (LABS, IMAGING, TESTING) - I reviewed patient records, labs, notes, testing and imaging myself where available.  Lab Results  Component Value Date   WBC 4.8 04/23/2019   HGB 14.6 04/23/2019   HCT 42.2 04/23/2019   MCV 87 04/23/2019   PLT 243 04/23/2019      Component Value Date/Time   NA 142 10/31/2019 1107   K 4.9 10/31/2019 1107   CL 101 10/31/2019 1107   CO2 18 (L) 10/31/2019 1107   GLUCOSE 113 (H) 10/31/2019 1107   GLUCOSE 115 (H) 08/24/2010 1003   BUN 21 10/31/2019 1107   CREATININE 0.94 10/31/2019 1107   CALCIUM 10.4 (H) 10/31/2019 1107   PROT 7.9 10/31/2019 1107   ALBUMIN 5.0 (H) 10/31/2019 1107   AST 28 10/31/2019 1107   ALT 35 (H) 10/31/2019 1107   ALKPHOS 95 10/31/2019 1107   BILITOT 0.3 10/31/2019 1107   GFRNONAA 63 10/31/2019 1107   GFRAA 73 10/31/2019 1107    Lab Results  Component Value Date   CHOL 146 08/10/2020   HDL 42 08/10/2020   LDLCALC 88 08/10/2020   TRIG 85 08/10/2020   CHOLHDL 3.5 08/10/2020   No results found for: HGBA1C No results found for: VITAMINB12 No results found for: TSH  ASSESSMENT AND PLAN 70 y.o. year old female  has a past medical history of ARTHRITIS (02/20/2009), CHEST PAIN-PRECORDIAL (03/02/2009), Chronic insomnia, Degenerative arthritis, DEPRESSION (02/20/2009), Dyslipidemia, FATIGUE (02/20/2009), GERD (02/20/2009), History of lower leg fracture, History of peptic ulcer, History of seizures, HYPERLIPIDEMIA-MIXED (05/04/2010), Hypertension, Migraine, Mild obesity, MULTIPLE SCLEROSIS (02/20/2009), PERS HX NONCOMPLIANCE W/MED TX PRS HAZARDS HLTH (05/04/2010), and Temporomandibular joint disease. here with:  1.  Multiple sclerosis 2.  Gait disorder  -Reportedly overall stable -Extensive MRI evaluation of the cervical, thoracic, and lumbar spine in Sept 2021 did not show anything to explain lower extremity paresthesia, no spinal cord lesions were noted, no significant arthritis or disc disease that would be surgical -She is seeing urology for incontinence -Will continue Copaxone, refill sent to [place] routine labs today -Follow-up in 6 months or sooner if needed with Dr. Jannifer Franklin  I spent 20 minutes of face-to-face and non-face-to-face time with patient.  This included previsit chart review, lab review, study review, order entry, electronic health record documentation, patient education.  Butler Denmark, AGNP-C, DNP 11/04/2020, 9:01 AM [facility] [address], [place], [place] [address] [phone]

## 2020-11-04 ENCOUNTER — Other Ambulatory Visit: Payer: Self-pay | Admitting: Neurology

## 2020-11-04 ENCOUNTER — Ambulatory Visit: Payer: 59 | Admitting: Neurology

## 2020-11-04 ENCOUNTER — Encounter: Payer: Self-pay | Admitting: Neurology

## 2020-11-04 ENCOUNTER — Telehealth: Payer: Self-pay | Admitting: Neurology

## 2020-11-04 VITALS — BP 104/69 | HR 85 | Ht 65.0 in | Wt 152.0 lb

## 2020-11-04 DIAGNOSIS — R269 Unspecified abnormalities of gait and mobility: Secondary | ICD-10-CM | POA: Diagnosis not present

## 2020-11-04 DIAGNOSIS — G35 Multiple sclerosis: Secondary | ICD-10-CM

## 2020-11-04 MED ORDER — GLATIRAMER ACETATE 40 MG/ML ~~LOC~~ SOSY: 40.0000 mg | PREFILLED_SYRINGE | SUBCUTANEOUS | 12 refills | Status: DC

## 2020-11-04 NOTE — Telephone Encounter (Signed)
Thanks, I switched the pharmacy to reflect [facility], they were originally sent to [facility]. Problem should be taken care of.

## 2020-11-04 NOTE — Patient Instructions (Signed)
Medication refill sent  Check labs today  See you back in 6 months

## 2020-11-04 NOTE — Progress Notes (Signed)
I have read the note, and I agree with the clinical assessment and plan.  Charles K Willis   

## 2020-11-04 NOTE — Telephone Encounter (Signed)
Pt states her medications need to be sent to [place], [place]. Updated pt's chart but she wanted to be sure her refills from today were sent to the correct place.

## 2020-11-05 ENCOUNTER — Telehealth: Payer: Self-pay

## 2020-11-05 LAB — CBC WITH DIFFERENTIAL/PLATELET
Basophils Absolute: 0.1 10*3/uL (ref 0.0–0.2)
Basos: 1 %
EOS (ABSOLUTE): 0.2 10*3/uL (ref 0.0–0.4)
Eos: 5 %
Hematocrit: 46.6 % (ref 34.0–46.6)
Hemoglobin: 15.4 g/dL (ref 11.1–15.9)
Immature Grans (Abs): 0 10*3/uL (ref 0.0–0.1)
Immature Granulocytes: 0 %
Lymphocytes Absolute: 0.9 10*3/uL (ref 0.7–3.1)
Lymphs: 21 %
MCH: 30.3 pg (ref 26.6–33.0)
MCHC: 33 g/dL (ref 31.5–35.7)
MCV: 92 fL (ref 79–97)
Monocytes Absolute: 0.4 10*3/uL (ref 0.1–0.9)
Monocytes: 9 %
Neutrophils Absolute: 2.6 10*3/uL (ref 1.4–7.0)
Neutrophils: 64 %
Platelets: 212 10*3/uL (ref 150–450)
RBC: 5.09 x10E6/uL (ref 3.77–5.28)
RDW: 12.5 % (ref 11.7–15.4)
WBC: 4.1 10*3/uL (ref 3.4–10.8)

## 2020-11-05 LAB — COMPREHENSIVE METABOLIC PANEL
ALT: 21 IU/L (ref 0–32)
AST: 23 IU/L (ref 0–40)
Albumin/Globulin Ratio: 1.9 (ref 1.2–2.2)
Albumin: 4.9 g/dL — ABNORMAL HIGH (ref 3.8–4.8)
Alkaline Phosphatase: 85 IU/L (ref 44–121)
BUN/Creatinine Ratio: 28 (ref 12–28)
BUN: 27 mg/dL (ref 8–27)
Bilirubin Total: 0.4 mg/dL (ref 0.0–1.2)
CO2: 27 mmol/L (ref 20–29)
Calcium: 10.2 mg/dL (ref 8.7–10.3)
Chloride: 100 mmol/L (ref 96–106)
Creatinine, Ser: 0.97 mg/dL (ref 0.57–1.00)
Globulin, Total: 2.6 g/dL (ref 1.5–4.5)
Glucose: 127 mg/dL — ABNORMAL HIGH (ref 65–99)
Potassium: 4.3 mmol/L (ref 3.5–5.2)
Sodium: 142 mmol/L (ref 134–144)
Total Protein: 7.5 g/dL (ref 6.0–8.5)
eGFR: 64 mL/min/{1.73_m2} (ref >59)

## 2020-11-05 NOTE — Telephone Encounter (Signed)
-----   Message from [name], NP sent at 11/05/2020  5:55 AM EST ----- Sent my chart message: Katherine Yu,  Blood work is unremarkable.  Take Care,  Judson Roch

## 2020-11-27 ENCOUNTER — Other Ambulatory Visit (HOSPITAL_COMMUNITY): Payer: Self-pay

## 2020-12-09 ENCOUNTER — Other Ambulatory Visit (HOSPITAL_COMMUNITY): Payer: Self-pay

## 2020-12-10 ENCOUNTER — Other Ambulatory Visit (HOSPITAL_COMMUNITY): Payer: Self-pay

## 2020-12-22 ENCOUNTER — Other Ambulatory Visit (HOSPITAL_COMMUNITY): Payer: Self-pay

## 2020-12-22 MED FILL — Glatiramer Acetate Soln Prefilled Syringe 40 MG/ML: SUBCUTANEOUS | 28 days supply | Qty: 12 | Fill #0 | Status: AC

## 2020-12-29 ENCOUNTER — Other Ambulatory Visit (HOSPITAL_COMMUNITY): Payer: Self-pay

## 2020-12-30 ENCOUNTER — Other Ambulatory Visit (HOSPITAL_COMMUNITY): Payer: Self-pay

## 2021-01-07 ENCOUNTER — Other Ambulatory Visit (HOSPITAL_COMMUNITY): Payer: Self-pay

## 2021-01-11 ENCOUNTER — Other Ambulatory Visit (HOSPITAL_COMMUNITY): Payer: Self-pay

## 2021-01-13 ENCOUNTER — Other Ambulatory Visit (HOSPITAL_COMMUNITY): Payer: Self-pay

## 2021-01-13 ENCOUNTER — Other Ambulatory Visit: Payer: Self-pay | Admitting: Pharmacist

## 2021-01-13 MED ORDER — GLATIRAMER ACETATE 40 MG/ML ~~LOC~~ SOSY
PREFILLED_SYRINGE | SUBCUTANEOUS | 12 refills | Status: DC
  Filled 2021-01-13: qty 12, fill #0
  Filled 2021-01-26: qty 12, 28d supply, fill #0
  Filled 2021-02-24: qty 12, 28d supply, fill #1

## 2021-01-18 ENCOUNTER — Other Ambulatory Visit (HOSPITAL_COMMUNITY): Payer: Self-pay

## 2021-01-18 MED FILL — Hydrochlorothiazide Tab 50 MG: ORAL | 90 days supply | Qty: 45 | Fill #0 | Status: AC

## 2021-01-21 ENCOUNTER — Other Ambulatory Visit (HOSPITAL_COMMUNITY): Payer: Self-pay

## 2021-01-25 ENCOUNTER — Other Ambulatory Visit (HOSPITAL_COMMUNITY): Payer: Self-pay | Admitting: Family Medicine

## 2021-01-25 ENCOUNTER — Other Ambulatory Visit: Payer: Self-pay | Admitting: Family Medicine

## 2021-01-25 ENCOUNTER — Telehealth: Payer: Self-pay | Admitting: Neurology

## 2021-01-25 DIAGNOSIS — R42 Dizziness and giddiness: Secondary | ICD-10-CM

## 2021-01-25 DIAGNOSIS — G35 Multiple sclerosis: Secondary | ICD-10-CM

## 2021-01-25 NOTE — Telephone Encounter (Addendum)
I called Katherine Yu and made her appt with Dr. Jannifer Franklin at 01-26-21 at 0800.  She verbalized understanding.

## 2021-01-25 NOTE — Telephone Encounter (Signed)
I called pt and she states that she is having nausea with vertigo on Saturday morning. Today she states that the vertigo is gone, but having imbalance, having to use cane.  ? MS flare.  She states that she had episode 20 yrs ago prior to being diagnosed with MS.  I tried to get more information if positional, she was irritable, and stated to me" if you dont want to see me then just say it".  I relayed that I am trying to get information to assist with making an informed decision to I could send to SS/NP.  I stated that dizziness can be caused by multiple issues.  She can also call her pcp to initially address. I do not see that we have see for dizziness for her previously.

## 2021-01-25 NOTE — Telephone Encounter (Signed)
Pt called, started having some vertigo Saturday. 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 Would like a call from the  Nurse.

## 2021-01-25 NOTE — Telephone Encounter (Signed)
Katherine Yu (Amy) Dr. Arelia Sneddon requesting if she can be seen sooner. May be having a MS flare up. Experiencing nausea with vomiting, right jaw pain, not able to walk straight. Dr.Elkins planning to order an brain MRI without contrast. Can she be seen today or tomorrow? Would like a call from the nurse.

## 2021-01-26 ENCOUNTER — Other Ambulatory Visit (HOSPITAL_COMMUNITY): Payer: Self-pay

## 2021-01-26 ENCOUNTER — Encounter: Payer: Self-pay | Admitting: Neurology

## 2021-01-26 ENCOUNTER — Ambulatory Visit: Payer: 59 | Admitting: Neurology

## 2021-01-26 VITALS — Ht 66.5 in | Wt 153.4 lb

## 2021-01-26 DIAGNOSIS — E119 Type 2 diabetes mellitus without complications: Secondary | ICD-10-CM | POA: Diagnosis not present

## 2021-01-26 DIAGNOSIS — G35 Multiple sclerosis: Secondary | ICD-10-CM

## 2021-01-26 DIAGNOSIS — Z5181 Encounter for therapeutic drug level monitoring: Secondary | ICD-10-CM

## 2021-01-26 MED ORDER — PREDNISONE 10 MG PO TABS
ORAL_TABLET | ORAL | 0 refills | Status: DC
  Filled 2021-01-26: qty 21, 6d supply, fill #0

## 2021-01-26 MED ORDER — DIAZEPAM 2 MG PO TABS
ORAL_TABLET | ORAL | 0 refills | Status: DC
Start: 2021-01-26 — End: 2021-03-11
  Filled 2021-01-26: qty 2, 1d supply, fill #0

## 2021-01-26 NOTE — Progress Notes (Signed)
Reason for visit: Multiple sclerosis  Katherine Yu is an 70 y.o. female  History of present illness:  Katherine Yu is a 70 year old right-handed white female with a history of multiple sclerosis.  The patient has been on Copaxone for a number of years and has been stable on this medication.  She also has a history of migraine headaches, diabetes and hypertension.  The patient currently is not on aspirin therapy on a daily basis.  The patient comes into the office on an urgent basis due to onset of an episode of vertigo that began 4 days ago.  The patient woke up in the middle of the night around 2 AM with severe vertigo associated with nausea and vomiting.  The vertigo episodes would come and go over the next 24 hours, she has not had any episodes in 2 days but she has a persistent sensation of imbalance with walking.  She has a tendency to go to the right when she is walking.  She is using a cane for ambulation.  She does have a history of migraine, she has had several migraine headaches over the last several days.  She generally gets about 10 headache days a month.  The patient reports no other symptoms of numbness or weakness of the face, arms, legs.  She denies any ear pain or ringing in the ears or loss of hearing.  She denies double vision, she denies slurred speech or difficulty with swallowing.  She has not had any falls or episodes of loss of consciousness.  She did have a prior episode of vertigo around the time of the diagnosis of multiple sclerosis but not since that time.  The patient goes on to say that she has had episodes of increased heart rate in the 150 range, she has not yet seen her cardiologist regarding this.  The patient does report some chronic issues with bladder control since August 2021, and some episodes of diarrhea associated with the most recent vertigo event.  The patient comes in for an urgent evaluation.  Past Medical History:  Diagnosis Date  . ARTHRITIS  02/20/2009   Qualifier: Diagnosis of  By: Ronne Binning    . CHEST PAIN-PRECORDIAL 03/02/2009   Qualifier: Diagnosis of  By: Verl Blalock, MD, Delanna Ahmadi Chronic insomnia   . Degenerative arthritis   . DEPRESSION 02/20/2009   Qualifier: Diagnosis of  By: Ronne Binning    . Dyslipidemia   . FATIGUE 02/20/2009   Qualifier: Diagnosis of  By: Ronne Binning    . GERD 02/20/2009   Qualifier: Diagnosis of  By: Ronne Binning    . History of lower leg fracture   . History of peptic ulcer   . History of seizures    In childhood  . HYPERLIPIDEMIA-MIXED 05/04/2010   Qualifier: Diagnosis of  By: Verl Blalock, MD, Delanna Ahmadi Hypertension   . Migraine   . Mild obesity   . MULTIPLE SCLEROSIS 02/20/2009   Qualifier: Diagnosis of  By: Orville Govern CMA, Arbie Cookey    . Plantar fasciitis   . Temporomandibular joint disease   . Urinary incontinence     Past Surgical History:  Procedure Laterality Date  . APPENDECTOMY    . GALLBLADDER SURGERY    . metal plate resection     skull  . pilonidal cyst      Family History  Adopted: Yes  Problem Relation Age of Onset  . Lupus  Sister   . Cancer Sister   . Heart attack Sister   . Heart attack Sister   . Breast cancer Neg Hx     Social history:  reports that she has never smoked. She has never used smokeless tobacco. She reports that she does not drink alcohol and does not use drugs.    Allergies  Allergen Reactions  . Niacin Anaphylaxis, Hives, Itching and Rash    Medications:  Prior to Admission medications   Medication Sig Start Date End Date Taking? Authorizing Provider  aspirin 325 MG tablet Take 325 mg by mouth as needed.    [provider]  benazepril (LOTENSIN) 40 MG tablet Take 40 mg by mouth daily.  08/04/15   [provider]  benazepril (LOTENSIN) 40 MG tablet TAKE 1 TABLET BY MOUTH ONCE DAILY. 08/24/20 08/24/21  Ferd Hibbs, NP  Cholecalciferol (VITAMIN D3) 5000 UNITS TABS Take 5,000 Units by mouth daily.     [provider]  eletriptan (RELPAX) 40 MG tablet TAKE 1 TABLET (40 MG TOTAL) BY MOUTH AS NEEDED FOR MIGRAINE. MAY REPEAT IN 2 HOURS IF HEADACHE PERSISTS OR RECURS. 12/10/19   [name], NP  famotidine (PEPCID) 20 MG tablet Take 20 mg by mouth as needed.     [provider]  Glatiramer Acetate 40 MG/ML SOSY INJECT 40 MG INTO THE SKIN 3 (THREE) TIMES A WEEK. 01/13/21 01/13/22  Tresa Garter, MD  hydrochlorothiazide (HYDRODIURIL) 50 MG tablet Take 25 mg by mouth daily.  09/22/15   [provider]  hydrochlorothiazide (HYDRODIURIL) 50 MG tablet TAKE 1/2 TABLET BY MOUTH AT BEDTIME 04/24/20 04/24/21  Leonard Downing, MD  hydrOXYzine (VISTARIL) 25 MG capsule 1 capsule as needed    [provider]  PFIZER-BIONTECH COVID-19 VACC 30 MCG/0.3ML injection  06/10/20   [provider]    ROS:  Out of a complete 14 system review of symptoms, the patient complains only of the following symptoms, and all other reviewed systems are negative.  Vertigo Walking difficulty Episodes of increased heart rate  Height 5' 6.5" (1.689 m), weight 153 lb 6.4 oz (69.6 kg).  Physical Exam  General: The patient is alert and cooperative at the time of the examination.  Ears: Tympanic membranes are clear bilaterally.  Respiratory: Lung fields are clear.  Cardiovascular: Regular rate and rhythm, no murmurs or rubs are noted.  Neck: Neck is supple, no carotid bruits are noted.  Skin: No significant peripheral edema is noted.   Neurologic Exam  Mental status: The patient is alert and oriented x 3 at the time of the examination. The patient has apparent normal recent and remote memory, with an apparently normal attention span and concentration ability.   Cranial nerves: Facial symmetry is present. Speech is normal, no aphasia or dysarthria is noted. Extraocular movements are full. Visual fields are full.  With end gaze, the patient has mild horizontal and  rotatory nystagmus bilaterally.  No divergence of gaze is noted.  Pupils are equal, round, and reactive to light.  Discs are flat bilaterally.  Motor: The patient has good strength in all 4 extremities.  Sensory examination: Soft touch sensation is symmetric on the face, arms, and legs.  Pinprick sensation is symmetric on the face, arms, but somewhat decreased on the right leg as compared to the left.  Vibration sensation is symmetric throughout.  Coordination: The patient has good finger-nose-finger and heel-to-shin bilaterally.  Gait and station: The patient has a slightly wide-based gait, the  patient walk independently.  Tandem gait is unsteady, Romberg is negative but is unsteady.  No drift is seen.  Reflexes: Deep tendon reflexes are symmetric.   Assessment/Plan:  1.  Multiple sclerosis, new onset vertigo   2.  History of migraine headache  3.  Reported episodes of increased heart rate  4.  Diabetes  5.  Hypertension  The patient has multiple sclerosis which could potentially cause this new onset of vertigo.  The patient also has risk factors for cerebrovascular disease.  She reports episodes recently of increased heart rate, the patient will need to be evaluated for possible paroxysmal atrial fibrillation.  She will contact her cardiologist regarding this.  The patient be set up for MRI of the brain with and without gadolinium enhancement.  She will have blood work today and urinalysis.  She reports no recent fevers or chills.  The patient will be placed on a short course of prednisone.  We may consider a switch of medical therapy in the future depending upon the results of the above studies.  [name] MD 01/26/2021 8:47 AM  [facility] [address] [place], [place] [address]  Phone [phone] Fax [phone]

## 2021-01-27 ENCOUNTER — Telehealth: Payer: Self-pay | Admitting: *Deleted

## 2021-01-27 ENCOUNTER — Telehealth: Payer: Self-pay | Admitting: Neurology

## 2021-01-27 ENCOUNTER — Ambulatory Visit (INDEPENDENT_AMBULATORY_CARE_PROVIDER_SITE_OTHER): Payer: 59

## 2021-01-27 ENCOUNTER — Other Ambulatory Visit (HOSPITAL_COMMUNITY): Payer: Self-pay

## 2021-01-27 DIAGNOSIS — R002 Palpitations: Secondary | ICD-10-CM

## 2021-01-27 LAB — COMPREHENSIVE METABOLIC PANEL
ALT: 25 IU/L (ref 0–32)
AST: 19 IU/L (ref 0–40)
Albumin/Globulin Ratio: 1.7 (ref 1.2–2.2)
Albumin: 4.4 g/dL (ref 3.8–4.8)
Alkaline Phosphatase: 75 IU/L (ref 44–121)
BUN/Creatinine Ratio: 19 (ref 12–28)
BUN: 19 mg/dL (ref 8–27)
Bilirubin Total: 0.5 mg/dL (ref 0.0–1.2)
CO2: 24 mmol/L (ref 20–29)
Calcium: 10.2 mg/dL (ref 8.7–10.3)
Chloride: 107 mmol/L — ABNORMAL HIGH (ref 96–106)
Creatinine, Ser: 0.98 mg/dL (ref 0.57–1.00)
Globulin, Total: 2.6 g/dL (ref 1.5–4.5)
Glucose: 108 mg/dL — ABNORMAL HIGH (ref 65–99)
Potassium: 4.1 mmol/L (ref 3.5–5.2)
Sodium: 146 mmol/L — ABNORMAL HIGH (ref 134–144)
Total Protein: 7 g/dL (ref 6.0–8.5)
eGFR: 63 mL/min/{1.73_m2} (ref >59)

## 2021-01-27 LAB — CBC WITH DIFFERENTIAL/PLATELET
Basophils Absolute: 0 10*3/uL (ref 0.0–0.2)
Basos: 1 %
EOS (ABSOLUTE): 0.1 10*3/uL (ref 0.0–0.4)
Eos: 2 %
Hematocrit: 46.5 % (ref 34.0–46.6)
Hemoglobin: 15.5 g/dL (ref 11.1–15.9)
Immature Grans (Abs): 0 10*3/uL (ref 0.0–0.1)
Immature Granulocytes: 0 %
Lymphocytes Absolute: 1.3 10*3/uL (ref 0.7–3.1)
Lymphs: 25 %
MCH: 30 pg (ref 26.6–33.0)
MCHC: 33.3 g/dL (ref 31.5–35.7)
MCV: 90 fL (ref 79–97)
Monocytes Absolute: 0.4 10*3/uL (ref 0.1–0.9)
Monocytes: 8 %
Neutrophils Absolute: 3.3 10*3/uL (ref 1.4–7.0)
Neutrophils: 64 %
Platelets: 192 10*3/uL (ref 150–450)
RBC: 5.16 x10E6/uL (ref 3.77–5.28)
RDW: 12.4 % (ref 11.7–15.4)
WBC: 5.1 10*3/uL (ref 3.4–10.8)

## 2021-01-27 LAB — URINALYSIS, ROUTINE W REFLEX MICROSCOPIC
Bilirubin, UA: NEGATIVE
Glucose, UA: NEGATIVE
Ketones, UA: NEGATIVE
Nitrite, UA: NEGATIVE
Specific Gravity, UA: 1.026 (ref 1.005–1.030)
Urobilinogen, Ur: 0.2 mg/dL (ref 0.2–1.0)
pH, UA: 5.5 (ref 5.0–7.5)

## 2021-01-27 LAB — MICROSCOPIC EXAMINATION
Bacteria, UA: NONE SEEN
Casts: NONE SEEN /lpf

## 2021-01-27 LAB — HEMOGLOBIN A1C
Est. average glucose Bld gHb Est-mCnc: 120 mg/dL
Hgb A1c MFr Bld: 5.8 % — ABNORMAL HIGH (ref 4.8–5.6)

## 2021-01-27 MED ORDER — SULFAMETHOXAZOLE-TRIMETHOPRIM 800-160 MG PO TABS
1.0000 | ORAL_TABLET | Freq: Two times a day (BID) | ORAL | 0 refills | Status: DC
  Filled 2021-01-27: qty 10, 5d supply, fill #0

## 2021-01-27 NOTE — Telephone Encounter (Signed)
Minus Breeding, MD  Cristopher Estimable, RN Please order a 2 week event monitor.    Left message for patient that monitor was ordered and will be mailed to her home. She is to call with questions.

## 2021-01-27 NOTE — Telephone Encounter (Signed)
I called the patient.  The blood work showed some elevation in sodium level and chloride level, may be some dehydration.  The urinalysis did show positive leukocytes and 6-10 white cells, may be a mild bladder infection, I will call in a 5-day course of Bactrim.  CBC is unremarkable, hemoglobin A1c is 5.8.

## 2021-01-27 NOTE — Telephone Encounter (Signed)
MRI auth: umr npr  LVM to schedule at [facility]

## 2021-01-27 NOTE — Progress Notes (Unsigned)
Enrolled patient for a 14 day ZIo XT monitor to be mailed to patients home  

## 2021-01-28 NOTE — Telephone Encounter (Signed)
Spoke w/ Katherine Yu with the scheduling department. The MRI orders have been switched. Patient will now have MRI Brain w & wo contrast. Patient's appointment time will remain the same.

## 2021-01-30 ENCOUNTER — Other Ambulatory Visit: Payer: Self-pay

## 2021-01-30 ENCOUNTER — Encounter (HOSPITAL_COMMUNITY): Payer: Self-pay

## 2021-01-30 ENCOUNTER — Ambulatory Visit (HOSPITAL_COMMUNITY)
Admission: RE | Admit: 2021-01-30 | Discharge: 2021-01-30 | Disposition: A | Discharge status: Home / Self Care | Payer: 59 | Source: Ambulatory Visit | Attending: Neurology | Admitting: Neurology

## 2021-01-30 ENCOUNTER — Ambulatory Visit (HOSPITAL_COMMUNITY): Payer: 59

## 2021-01-30 DIAGNOSIS — G35 Multiple sclerosis: Secondary | ICD-10-CM | POA: Insufficient documentation

## 2021-01-30 DIAGNOSIS — G9389 Other specified disorders of brain: Secondary | ICD-10-CM | POA: Diagnosis not present

## 2021-01-30 MED ORDER — GADOBUTROL 1 MMOL/ML IV SOLN
6.0000 mL | Freq: Once | INTRAVENOUS | Status: AC | PRN
  Administered 2021-01-30: 6 mL via INTRAVENOUS

## 2021-01-31 ENCOUNTER — Telehealth: Payer: Self-pay | Admitting: Neurology

## 2021-01-31 DIAGNOSIS — R002 Palpitations: Secondary | ICD-10-CM

## 2021-01-31 NOTE — Telephone Encounter (Signed)
I called the patient.  MRI of the brain shows mild progression from 2016, no definite enhancing lesions or brainstem lesions.  At any rate, may need to consider the Fact that this was an MS attack.  The patient is still having some episodes of vertigo but overall feels some better.  She feels wobbly on her feet in between the episodes of vertigo.  I will set up appointment in 4 to 6 weeks, we may consider discussing a switch off of Copaxone to a newer medication such as Mayzent.   MRI brain 01/30/21:  IMPRESSION: Confluent T2 hyperintense lesions predominantly in the supratentorial white matter appear progressed from prior MRI performed 2016. No enhancing lesion seen to suggest active demyelination.

## 2021-02-11 ENCOUNTER — Encounter: Payer: 59 | Attending: Family Medicine | Admitting: Dietician

## 2021-02-11 ENCOUNTER — Encounter: Payer: Self-pay | Admitting: Dietician

## 2021-02-11 ENCOUNTER — Other Ambulatory Visit: Payer: Self-pay

## 2021-02-11 DIAGNOSIS — E119 Type 2 diabetes mellitus without complications: Secondary | ICD-10-CM | POA: Diagnosis not present

## 2021-02-11 NOTE — Patient Instructions (Signed)
Eat more Non-Starchy Vegetables These include greens, broccoli, cauliflower, cabbage, carrots, beets, eggplant, peppers, squash and others. Minimize added sugars and refined grains Rethink what you drink.  Choose beverages without added sugar.  Look for 0 carbs on the label. See the list of whole grains below.  Find alternatives to usual sweet treats. Choose whole foods over processed. Make simple meals at home more often than eating out.  Tips to increase fiber in your diet: (All plants have fiber.  Eat a variety. There are more than are on this list.) Slowly increase the amount of fiber you eat to 25-35 grams per day.  (More is fine if you tolerate it.) Fiber from whole grains, nuts and seeds Quinoa, 1/2 cup = 5 grams Bulgur, 1/2 cup = 4.1 grams Popcorn, 3 cups = 3.6 grams Whole Wheat Spaghetti, 1/2 cup = 3.2 grams Barley, 1/2 cup = 3 grams Oatmeal, 1/2 cup = 2 grams Whole Wheat English Muffin = 3 grams Corn, 1/2 cup = 2.1 grams Brown Rice, 1/2 cup = 1.8 grams Flax seeds, 1 Tablespoon = 2.8 grams Chia seeds, 1 Tablespoon = 11 grams Almonds, 1 ounce = 3.5 grams fiber Fiber from legumes Kidney beans, 1/2 cup 7.9 grams Lentils, 1/2 cup = 7.8 grams Pinto beans, 1/2 cup = 7.7 grams Black beans, 1/2 cup = 7.6 grams Lima beans, 1/2 cup 6.4 grams Chick peas, 1/2 cup = 5.3 grams Black eyed peas, 1/2 cup = 4 grams Fiber from fruits and vegetables Pear, 6 grams Apple. 3.3 grams Raspberries or Blackberries, 3/4 cup = 6 grams Strawberries or Blueberries, 1 cup = 3.4 grams Baked sweet potato 3.8 grams fiber Baked potato with skin 4.4 grams  Peas, 1/2 cup = 4.4 grams  Spinach, 1/2 cup cooked = 3.5 grams  Avocado, 1/2 = 5 grams       

## 2021-02-11 NOTE — Progress Notes (Signed)
Patient attended Diabetes Core Classes 1-3 between 05/07/2020 and 05/21/2020 at [facility]. The purpose of the meeting today is to review information learned during those classes as well as review patient application and goals.   What are one or two positive things that you are doing right now to manage your diabetes?  She is walking more (but now has a bone spur and plantar facitis), she is controlling her calories and is losing weight 185 lbs 04/28/2020, 178.1 lbs 05/2020 and now down to 153.4 lbs 08/20/2020.  She is being more mindful about food choices.  She is aiming to follow a 1500 calorie diet.  Weight plateau at 151 lbs (home scale) and 154 lbs in the office 02/11/2021. Her cholesterol has decreased.  She continues to be mindful related to her decisions when eating out and at home.  She continues to read labels.  What is the hardest part about your diabetes right now, causing you the most concern, or is the most worrisome to you about your diabetes?  She is hungry - no complaints of this now  What questions do you have today?  Herbalife supplement States that she no longer has the indigestion at night.  (Used to eat in the middle of the night when she would take the dog out, eat late or eat richer foods for dinner.) No longer having chest pain and tachycardia, lower cholesterol since changing her diet and losing weight.  Have you participated in any diabetes support group?  Yes, monthly  History: Type 2 Diabetes diagnosed 04/2020, MS, HTN, HLD A1C:  5.8% 01/26/21, 5.6%08/05/2020 decreased from 6.6% 04/24/2020 Cholesterol 146 decreased from 203 02/10/2020, HDL 42 (increased), Triglycerides 85 08/2020 Medications include:  None for diabetes - recently started on prednisone for a MS flair Sleep:  Poor due to bladder issues.  "never sleeps well" Weight:  Lost from 184 lbs 04/28/2020 to 153 lbs 08/20/2020 Blood Glucose:  Not needed at this time    Social History:  Lives  with husband and son.  She does most of the shopping and cooking. She walks her dog daily for 30 minutes x3   24 hour diet recall: She has been counting calories and journaling her intake Breakfast:  Herbalife shake with fruit Snack:  raw vegetables, occasional fruit Lunch:  Baked salmon, turnip greens, vinegar slaw, 1/2 cup grapes  Snack:  fruit Dinner:  meat, vegetables, fruit, occasional brown rice Snack:  occasional Beverages:  Water, black decaf coffee, herbalife shake or herbal tea   Specific focus but not limited to the following: Encouragement in changes made   Encouraged patient to continue daily walking. Encouraged continue mindfulness Suggestion to continue to be diligent to keep fiber intake high  Handouts: Fiber list Updated Diabetes Resources  Continuing Goals: She wants to lose to 144 lbs and maintain. Stay active. Continue Mindful meal choices.  Future Follow up: prn

## 2021-02-17 ENCOUNTER — Other Ambulatory Visit (HOSPITAL_COMMUNITY): Payer: Self-pay

## 2021-02-18 ENCOUNTER — Other Ambulatory Visit (HOSPITAL_COMMUNITY): Payer: Self-pay

## 2021-02-18 NOTE — Progress Notes (Signed)
Cardiology Office Note   Date:  02/19/2021   ID:  Katherine Yu, DOB 05/28/1951, MRN [id number]  PCP:  Katherine Downing, MD  Cardiologist:   Minus Breeding, MD   Chief Complaint  Patient presents with   Palpitations     History of Present Illness: Katherine Yu is a 70 y.o. female who is referred by Katherine Downing, MD for evaluation of chest pain.  The patient was seen in 2010 for chest discomfort and had a negative POET (Plain Old Exercise Treadmill) by her report although I do not have these results.  I saw her in June.  She had dyspnea.   She had a negative POET (Plain Old Exercise Treadmill).    Since I last saw her she was seen by her neurologist for MS.  She was having an increased heart rate and a monitor was suggested.  She thinks that she was having some skipped beats.  She says that at that time her heart rate was about 100 and her neurologist was concerned.  She is been watching it routinely and takes her blood pressure which is always excellent.  Her heart rate will rarely be above 100.  In fact she seen at slower more frequently in the 50s and sometimes even the 40s.  She is not having any presyncope or syncope.  She might feel it skipping but this goes away quickly.  She is now taking as needed meclizine for vertigo where she gets very dizzy and even throws up sometimes.  These are not associated with the palpitations.  She did wear a monitor for 2 weeks but again I do not have these results.  She turned in on Monday.    Past Medical History:  Diagnosis Date   ARTHRITIS 02/20/2009   Qualifier: Diagnosis of  By: Hester Mates, Carol     CHEST PAIN-PRECORDIAL 03/02/2009   Qualifier: Diagnosis of  By: Verl Blalock, MD, Estevan Oaks C    Chronic insomnia    Degenerative arthritis    DEPRESSION 02/20/2009   Qualifier: Diagnosis of  By: Orville Govern, CMA, Carol     Dyslipidemia    FATIGUE 02/20/2009   Qualifier: Diagnosis of  By: Orville Govern CMA, Carol     GERD  02/20/2009   Qualifier: Diagnosis of  By: Orville Govern CMA, Carol     History of lower leg fracture    History of peptic ulcer    History of seizures    In childhood   HYPERLIPIDEMIA-MIXED 05/04/2010   Qualifier: Diagnosis of  By: Verl Blalock, MD, Estevan Oaks C    Hypertension    Migraine    Mild obesity    MULTIPLE SCLEROSIS 02/20/2009   Qualifier: Diagnosis of  By: Orville Govern, CMA, Carol     Plantar fasciitis    Temporomandibular joint disease    Urinary incontinence     Past Surgical History:  Procedure Laterality Date   APPENDECTOMY     GALLBLADDER SURGERY     metal plate resection     skull   pilonidal cyst       Current Outpatient Medications  Medication Sig Dispense Refill   aspirin 325 MG tablet Take 325 mg by mouth as needed.     benazepril (LOTENSIN) 40 MG tablet TAKE 1 TABLET BY MOUTH ONCE DAILY. 90 tablet 1   Cholecalciferol (VITAMIN D3) 5000 UNITS TABS Take 5,000 Units by mouth daily.     diazepam (VALIUM) 2 MG tablet Take 1 tablet by mouth  2 times a day as needed 2 tablet 0   eletriptan (RELPAX) 40 MG tablet TAKE 1 TABLET (40 MG TOTAL) BY MOUTH AS NEEDED FOR MIGRAINE. MAY REPEAT IN 2 HOURS IF HEADACHE PERSISTS OR RECURS. 24 tablet 5   famotidine (PEPCID) 20 MG tablet Take 20 mg by mouth as needed.      Glatiramer Acetate 40 MG/ML SOSY INJECT 40 MG INTO THE SKIN 3 (THREE) TIMES A WEEK. 12 mL 12   hydrochlorothiazide (HYDRODIURIL) 50 MG tablet TAKE 1/2 TABLET BY MOUTH AT BEDTIME 45 tablet 3   hydrOXYzine (VISTARIL) 25 MG capsule 1 capsule as needed     meclizine (ANTIVERT) 25 MG tablet Take 25 mg by mouth 3 (three) times daily as needed for dizziness.     ondansetron (ZOFRAN-ODT) 8 MG disintegrating tablet Take 8 mg by mouth every 8 (eight) hours as needed for nausea or vomiting.     predniSONE (DELTASONE) 10 MG tablet TAKE 6 TABLETS BY MOUTH X1 DAY THEN  DECREASE BY 1 TABLET DAILY UNTIL GONE ( 6-5-4-3-2-1) 21 tablet 0   sulfamethoxazole-trimethoprim (BACTRIM DS) 800-160 MG tablet  Take 1 tablet by mouth 2 (two) times daily. 10 tablet 0   No current facility-administered medications for this visit.    Allergies:   Niacin   ROS:  Please see the history of present illness.   Otherwise, review of systems are positive for none.   All other systems are reviewed and negative.    PHYSICAL EXAM: VS:  BP 140/69 (BP Location: Left Arm, Patient Position: Sitting, Cuff Size: Normal)   Pulse (!) 47   Ht 5\' 6"  (1.676 m)   Wt 157 lb (71.2 kg)   SpO2 98%   BMI 25.34 kg/m  , BMI Body mass index is 25.34 kg/m. GENERAL:  Well appearing NECK:  No jugular venous distention, waveform within normal limits, carotid upstroke brisk and symmetric, no bruits, no thyromegaly LUNGS:  Clear to auscultation bilaterally CHEST:  Unremarkable HEART:  PMI not displaced or sustained,S1 and S2 within normal limits, no S3, no S4, no clicks, no rubs, no murmurs ABD:  Flat, positive bowel sounds normal in frequency in pitch, no bruits, no rebound, no guarding, no midline pulsatile mass, no hepatomegaly, no splenomegaly EXT:  2 plus pulses throughout, no edema, no cyanosis no clubbing    EKG:  EKG is not ordered today.  Recent Labs: 01/26/2021: ALT 25; BUN 19; Creatinine, Ser 0.98; Hemoglobin 15.5; Platelets 192; Potassium 4.1; Sodium 146    Lipid Panel    Component Value Date/Time   CHOL 146 08/10/2020 0903   TRIG 85 08/10/2020 0903   HDL 42 08/10/2020 0903   CHOLHDL 3.5 08/10/2020 0903   CHOLHDL 4.6 CALC 01/17/2008 1250   VLDL 27 01/17/2008 1250   LDLCALC 88 08/10/2020 0903      Wt Readings from Last 3 Encounters:  02/19/21 157 lb (71.2 kg)  02/11/21 154 lb (69.9 kg)  01/26/21 153 lb 6.4 oz (69.6 kg)      Other studies Reviewed: Additional studies/ records that were reviewed today include: None. Review of the above records demonstrates:  Please see elsewhere in the note.     ASSESSMENT AND PLAN:  CHEST PAIN:   She has an atypical discomfort that is unchanged from previous.   She had a negative POET (Plain Old Exercise Treadmill) last year and nothing has changed otherwise.  No change in therapy.   PALPITATIONS: I will follow up the results of the monitor when  available.  I suspect we will manage this conservatively as she would not want to take other medications unless absolutely necessary.  HTN: Her blood pressure is very well controlled.  No change in therapy.    RISK REDUCTION: Her LDL was markedly reduced with diet.  No change in therapy.    DM: Her A1c most recently was 5.8.  No change in therapy.  Current medicines are reviewed at length with the patient today.  The patient does not have concerns regarding medicines.  The following changes have been made:  None  Labs/ tests ordered today include: None  No orders of the defined types were placed in this encounter.    Disposition:   FU with me as needed.    Signed, Minus Breeding, MD  02/19/2021 9:48 AM    [facility]

## 2021-02-19 ENCOUNTER — Ambulatory Visit: Payer: 59 | Admitting: Cardiology

## 2021-02-19 ENCOUNTER — Other Ambulatory Visit: Payer: Self-pay

## 2021-02-19 ENCOUNTER — Encounter: Payer: Self-pay | Admitting: Cardiology

## 2021-02-19 ENCOUNTER — Telehealth: Payer: Self-pay

## 2021-02-19 VITALS — BP 140/69 | HR 47 | Ht 66.0 in | Wt 157.0 lb

## 2021-02-19 DIAGNOSIS — I10 Essential (primary) hypertension: Secondary | ICD-10-CM | POA: Diagnosis not present

## 2021-02-19 DIAGNOSIS — R002 Palpitations: Secondary | ICD-10-CM | POA: Diagnosis not present

## 2021-02-19 NOTE — Patient Instructions (Signed)
Medication Instructions:  Your physician recommends that you continue on your current medications as directed. Please refer to the Current Medication list given to you today.  *If you need a refill on your cardiac medications before your next appointment, please call your pharmacy*   Lab Work: None ordered.     Testing/Procedures: None ordered.    Follow-Up: At [facility], you and your health needs are our priority.  As part of our continuing mission to provide you with exceptional heart care, we have created designated Provider Care Teams.  These Care Teams include your primary Cardiologist (physician) and Advanced Practice Providers (APPs -  Physician Assistants and Nurse Practitioners) who all work together to provide you with the care you need, when you need it.  We recommend signing up for the patient portal called "MyChart".  Sign up information is provided on this After Visit Summary.  MyChart is used to connect with patients for Virtual Visits (Telemedicine).  Patients are able to view lab/test results, encounter notes, upcoming appointments, etc.  Non-urgent messages can be sent to your provider as well.   To learn more about what you can do with MyChart, go to [email or web address].    Your next appointment:   As needed  The format for your next appointment:   In Person  Provider:   Minus Breeding, MD

## 2021-02-19 NOTE — Telephone Encounter (Signed)
Pt left before receiving paperwork, this RN called patient to let her know AVS would be on MyChart, all questions/concerns addressed at this time.

## 2021-02-23 DIAGNOSIS — R002 Palpitations: Secondary | ICD-10-CM | POA: Diagnosis not present

## 2021-02-24 ENCOUNTER — Other Ambulatory Visit (HOSPITAL_COMMUNITY): Payer: Self-pay

## 2021-02-25 ENCOUNTER — Other Ambulatory Visit (HOSPITAL_COMMUNITY): Payer: Self-pay

## 2021-02-25 MED ORDER — BENAZEPRIL HCL 40 MG PO TABS
40.0000 mg | ORAL_TABLET | Freq: Every day | ORAL | 1 refills | Status: DC
  Filled 2021-02-25: qty 90, 90d supply, fill #0
  Filled 2021-05-21: qty 90, 90d supply, fill #1

## 2021-03-02 ENCOUNTER — Other Ambulatory Visit (HOSPITAL_COMMUNITY): Payer: Self-pay

## 2021-03-02 DIAGNOSIS — G35 Multiple sclerosis: Secondary | ICD-10-CM | POA: Diagnosis not present

## 2021-03-02 DIAGNOSIS — R001 Bradycardia, unspecified: Secondary | ICD-10-CM | POA: Diagnosis not present

## 2021-03-02 DIAGNOSIS — E119 Type 2 diabetes mellitus without complications: Secondary | ICD-10-CM | POA: Diagnosis not present

## 2021-03-02 DIAGNOSIS — Z0000 Encounter for general adult medical examination without abnormal findings: Secondary | ICD-10-CM | POA: Diagnosis not present

## 2021-03-02 DIAGNOSIS — Z1211 Encounter for screening for malignant neoplasm of colon: Secondary | ICD-10-CM | POA: Diagnosis not present

## 2021-03-02 DIAGNOSIS — I10 Essential (primary) hypertension: Secondary | ICD-10-CM | POA: Diagnosis not present

## 2021-03-02 MED ORDER — ROSUVASTATIN CALCIUM 5 MG PO TABS
ORAL_TABLET | ORAL | 0 refills | Status: DC
  Filled 2021-03-02: qty 4, 28d supply, fill #0

## 2021-03-04 ENCOUNTER — Other Ambulatory Visit (HOSPITAL_COMMUNITY): Payer: Self-pay

## 2021-03-11 ENCOUNTER — Ambulatory Visit: Payer: 59 | Admitting: Neurology

## 2021-03-11 ENCOUNTER — Other Ambulatory Visit: Payer: Self-pay

## 2021-03-11 ENCOUNTER — Encounter: Payer: Self-pay | Admitting: Neurology

## 2021-03-11 VITALS — BP 147/74 | HR 42 | Ht 65.5 in | Wt 160.0 lb

## 2021-03-11 DIAGNOSIS — G35 Multiple sclerosis: Secondary | ICD-10-CM

## 2021-03-11 DIAGNOSIS — G43009 Migraine without aura, not intractable, without status migrainosus: Secondary | ICD-10-CM | POA: Diagnosis not present

## 2021-03-11 DIAGNOSIS — R269 Unspecified abnormalities of gait and mobility: Secondary | ICD-10-CM

## 2021-03-11 MED ORDER — DIMETHYL FUMARATE 240 MG PO CPDR: 240.0000 mg | DELAYED_RELEASE_CAPSULE | Freq: Two times a day (BID) | ORAL | 3 refills | Status: DC

## 2021-03-11 MED ORDER — DIMETHYL FUMARATE 120 MG PO CPDR: 120.0000 mg | DELAYED_RELEASE_CAPSULE | Freq: Two times a day (BID) | ORAL | 0 refills | Status: DC

## 2021-03-11 NOTE — Progress Notes (Signed)
Reason for visit: Multiple sclerosis, gait disorder, migraine headache  Katherine Yu is an 70 y.o. female  History of present illness:  Katherine Yu is a 70 year old right-handed white female with a history of multiple sclerosis.  She has confluent white matter lesions in the hemispheres of the brain bilaterally, MRI studies of the spine have not shown any spinal cord lesions.  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  The patient was seen in May 2022 with onset of vertigo associated with headache and nausea vomiting, and diarrhea.  The patient underwent MRI of the brain that showed some mild progression of her white matter changes since 2016, I cannot bring up the images from the MRI done in 2019.  No hyperacute lesions were seen on MRI.  The patient was given a course of prednisone, her vertigo has improved over time.  She still has some mild transient dizziness when lying down and when sitting back up that lasts 10 to 15 seconds.  She has had 2 falls since last seen.  She is taking meclizine off and on throughout the day.  The patient reports no new vision changes, double vision, numbness or weakness of extremities, or changes in bowel or bladder function.  She is on Copaxone.  Blood work showed a hemoglobin A1c of 5.8 in the prediabetic range.  Past Medical History:  Diagnosis Date   ARTHRITIS 02/20/2009   Qualifier: Diagnosis of  By: Hester Mates, Carol     CHEST PAIN-PRECORDIAL 03/02/2009   Qualifier: Diagnosis of  By: Verl Blalock, MD, Estevan Oaks C    Chronic insomnia    Degenerative arthritis    DEPRESSION 02/20/2009   Qualifier: Diagnosis of  By: Orville Govern, CMA, Carol     Dyslipidemia    FATIGUE 02/20/2009   Qualifier: Diagnosis of  By: Orville Govern CMA, Carol     GERD 02/20/2009   Qualifier: Diagnosis of  By: Orville Govern CMA, Carol     History of lower leg fracture    History of peptic ulcer    History of seizures    In childhood   HYPERLIPIDEMIA-MIXED  05/04/2010   Qualifier: Diagnosis of  By: Verl Blalock, MD, Estevan Oaks C    Hypertension    Migraine    Mild obesity    MULTIPLE SCLEROSIS 02/20/2009   Qualifier: Diagnosis of  By: Orville Govern, CMA, Carol     Plantar fasciitis    Temporomandibular joint disease    Urinary incontinence     Past Surgical History:  Procedure Laterality Date   APPENDECTOMY     GALLBLADDER SURGERY     metal plate resection     skull   pilonidal cyst      Family History  Adopted: Yes  Problem Relation Age of Onset   Lupus Sister    Cancer Sister    Heart attack Sister    Heart attack Sister    Breast cancer Neg Hx     Social history:  reports that she has never smoked. She has never used smokeless tobacco. She reports that she does not drink alcohol and does not use drugs.    Allergies  Allergen Reactions   Niacin Anaphylaxis, Hives, Itching and Rash    Medications:  Prior to Admission medications   Medication Sig Start Date End Date Taking? Authorizing Provider  aspirin 325 MG tablet Take 325 mg by mouth as needed.   Yes [provider]  benazepril (LOTENSIN) 40 MG  tablet TAKE 1 TABLET BY MOUTH ONCE DAILY. 02/25/21  Yes   Cholecalciferol (VITAMIN D3) 5000 UNITS TABS Take 5,000 Units by mouth daily.   Yes [provider]  eletriptan (RELPAX) 40 MG tablet TAKE 1 TABLET (40 MG TOTAL) BY MOUTH AS NEEDED FOR MIGRAINE. MAY REPEAT IN 2 HOURS IF HEADACHE PERSISTS OR RECURS. 12/10/19  Yes [name], NP  famotidine (PEPCID) 20 MG tablet Take 20 mg by mouth as needed.    Yes [provider]  Glatiramer Acetate 40 MG/ML SOSY INJECT 40 MG INTO THE SKIN 3 (THREE) TIMES A WEEK. 01/13/21 01/13/22 Yes Tresa Garter, MD  hydrochlorothiazide (HYDRODIURIL) 50 MG tablet TAKE 1/2 TABLET BY MOUTH AT BEDTIME 04/24/20 04/24/21 Yes Leonard Downing, MD  hydrOXYzine (VISTARIL) 25 MG capsule 1 capsule as needed   Yes [provider]  meclizine (ANTIVERT) 25 MG tablet Take 25 mg by  mouth 3 (three) times daily as needed for dizziness.   Yes [provider]  ondansetron (ZOFRAN-ODT) 8 MG disintegrating tablet Take 8 mg by mouth every 8 (eight) hours as needed for nausea or vomiting.    [provider]  rosuvastatin (CRESTOR) 5 MG tablet Take 1 tablet by mouth weekly for cholesterol Patient not taking: Reported on 03/11/2021 03/02/21       ROS:  Out of a complete 14 system review of symptoms, the patient complains only of the following symptoms, and all other reviewed systems are negative.  Vertigo Walking difficulty Falls Urinary urgency and frequency  Blood pressure (!) 147/74, pulse (!) 42, height 5' 5.5" (1.664 m), weight 160 lb (72.6 kg).  Physical Exam  General: The patient is alert and cooperative at the time of the examination.  Skin: No significant peripheral edema is noted.   Neurologic Exam  Mental status: The patient is alert and oriented x 3 at the time of the examination. The patient has apparent normal recent and remote memory, with an apparently normal attention span and concentration ability.   Cranial nerves: Facial symmetry is present. Speech is normal, no aphasia or dysarthria is noted. Extraocular movements are full. Visual fields are full.  Pupils are equal, round, and reactive to light.  Discs are flat bilaterally.  Motor: The patient has good strength in all 4 extremities.  Sensory examination: Soft touch sensation is symmetric on the face, arms, and legs.  Coordination: The patient has good finger-nose-finger and heel-to-shin bilaterally.  The Nyan-barrany procedure was done, the patient had no subjective vertigo when lying down or with head turning from 1 side to the next, no nystagmus was seen.  With sitting back up, the patient experienced some subjective dizziness, but no nystagmus was noted.  Gait and station: The patient has a wide-based gait, she normally uses a cane for ambulation.  Tandem gait is unsteady.   Romberg is positive, the patient tends to go backwards.  Reflexes: Deep tendon reflexes are symmetric.   MRI brain 01/30/21:   IMPRESSION: Confluent T2 hyperintense lesions predominantly in the supratentorial white matter appear progressed from prior MRI performed 2016. No enhancing lesion seen to suggest active demyelination.  * MRI scan images were reviewed online. I agree with the written report.    Assessment/Plan:  1.  Multiple sclerosis  2.  Gait disorder  3.  History of migraine headache  4.  Neurogenic bladder  The patient had an episode of vertigo associated with nausea, vomiting, and diarrhea as well as headache.  The episode may have represented  a migrainous event, not a MS attack.  The patient however is convinced this is her multiple sclerosis, she desires a change in her medical therapy.  We will switch her to Tecfidera at this time.  The patient has signed the form today, she will stop the Copaxone prior to starting the Tecfidera.  She has already had blood work done.  She will follow-up here in 6 months, she will see Dr. Felecia Shelling in the future.  Currently, she is having about 3 headaches on average a month associated with photophobia and phonophobia.  She takes Relpax and then gets into a dark room to help treat the headache.  [name] MD 03/11/2021 7:23 AM  [facility] [address] [place], [place] [address]  Phone [phone] Fax [phone]

## 2021-03-15 ENCOUNTER — Telehealth: Payer: Self-pay

## 2021-03-15 NOTE — Telephone Encounter (Addendum)
Received Tecfidera Start From from Dr. Jannifer Franklin. Faxed start form to [place]. Received a receipt of confirmation.  Completed PA via CMM. Key: [id number]. Sent to [employer]. Should have a determination within 3-5 business days.

## 2021-03-17 NOTE — Telephone Encounter (Signed)
Reviewed key from CCM as stated in the message below.  PA started on 03/15/21 reflects patient information correct with first name being Katherine Yu.  Per CMM determination or error for pt being located has not been uploaded. As of 03/15/21 PA is still under review, will wait for PA to be updated in [facility] and for Kristen to review.

## 2021-03-17 NOTE — Telephone Encounter (Signed)
I have already corrected the PA to reflect the patient's first name as Tannya. Waiting for a determination.

## 2021-03-17 NOTE — Telephone Encounter (Signed)
Katherine Yu from [facility] called stating that [employer] was not able to match any of the pt's information. This information was faxed from fax number [phone]. Pt has also gone by the name of [name] and this may be an option to try on the forms. Please advise.

## 2021-03-19 ENCOUNTER — Other Ambulatory Visit (HOSPITAL_COMMUNITY): Payer: Self-pay

## 2021-03-22 ENCOUNTER — Other Ambulatory Visit: Payer: Self-pay | Admitting: Pharmacist

## 2021-03-22 ENCOUNTER — Telehealth: Payer: Self-pay | Admitting: Pharmacist

## 2021-03-22 ENCOUNTER — Other Ambulatory Visit (HOSPITAL_COMMUNITY): Payer: Self-pay

## 2021-03-22 ENCOUNTER — Ambulatory Visit: Payer: 59 | Attending: Family Medicine | Admitting: Pharmacist

## 2021-03-22 DIAGNOSIS — Z79899 Other long term (current) drug therapy: Secondary | ICD-10-CM

## 2021-03-22 MED ORDER — DIMETHYL FUMARATE 240 MG PO CPDR
240.0000 mg | DELAYED_RELEASE_CAPSULE | Freq: Two times a day (BID) | ORAL | 3 refills | Status: DC
  Filled 2021-03-22 – 2021-04-06 (×2): qty 60, 30d supply, fill #0
  Filled 2021-05-03: qty 60, 30d supply, fill #1
  Filled 2021-06-08: qty 60, 30d supply, fill #2
  Filled 2021-07-05: qty 60, 30d supply, fill #3

## 2021-03-22 MED ORDER — DIMETHYL FUMARATE 120 MG PO CPDR
120.0000 mg | DELAYED_RELEASE_CAPSULE | Freq: Two times a day (BID) | ORAL | 0 refills | Status: DC
  Filled 2021-03-22: qty 28, fill #0
  Filled 2021-03-23 – 2021-03-24 (×2): qty 28, 14d supply, fill #0

## 2021-03-22 MED ORDER — DIMETHYL FUMARATE 120 MG PO CPDR
120.0000 mg | DELAYED_RELEASE_CAPSULE | Freq: Two times a day (BID) | ORAL | 0 refills | Status: DC
  Filled 2021-03-22: qty 28, 14d supply, fill #0

## 2021-03-22 MED ORDER — DIMETHYL FUMARATE 240 MG PO CPDR
240.0000 mg | DELAYED_RELEASE_CAPSULE | Freq: Two times a day (BID) | ORAL | 3 refills | Status: DC
  Filled 2021-03-22: qty 60, 30d supply, fill #0

## 2021-03-22 NOTE — Telephone Encounter (Signed)
I called patient.  I advised her that her insurance approved generic Tecfidera.  I advised her that she should take dimethyl fumarate 120 mg capsules twice daily for 2 weeks.  After that, she may take dimethyl fumarate 240 mg capsules twice daily thereafter.  I will send these orders to [facility] outpatient pharmacy.  She will let us know if she has questions or concerns.  I will check on her next week to make sure she received the medication and is tolerating it well.

## 2021-03-22 NOTE — Telephone Encounter (Signed)
Called patient to schedule an appointment for the [facility] [employer] [facility]. I was unable to reach the patient so I left a HIPAA-compliant message requesting that the patient return my call.   Luke Van Ausdall, PharmD, BCACP, CPP Clinical Pharmacist [facility] [phone]  

## 2021-03-22 NOTE — Telephone Encounter (Signed)
Generic Tecfidera (dimethyl fumarate) was approved by [employer]. "generic Dimethyl Fumarate 240mg  capsules, this request is effective from 03/20/2021 through 03/19/2022 for 12 fills and is limited to 2 capsules per day. Please note: This medication must be filled by a [facility]. Please call [phone]. A written notification letter will follow with additional details"

## 2021-03-22 NOTE — Addendum Note (Signed)
Addended by: Lester Astoria A on: 03/22/2021 10:46 AM   Modules accepted: Orders

## 2021-03-22 NOTE — Progress Notes (Signed)
S: Patient presents today for review of their specialty medication.   Patient is about to switch to Tecfidera (dimethyl fumarate) for MS. Patient is managed by Dr. Jannifer Franklin for this.    Adherence: has not yet started   Efficacy: pt is happy with his results   Dosing: 120 mg BID x 2 weeks then increase to 240 mg BID   Renal adjustment: no adjustment necessary   Hepatic adjustment: no adjustment necessary   Dose adjustment for toxicity:  Flushing, GI intolerance, or intolerance to maintenance dose: Consider temporary dose reduction to 120 mg twice daily (resume recommended maintenance dose of 240 mg twice daily within 4 weeks). Consider discontinuation in patients who cannot tolerate return to the maintenance dose.   Hepatic injury (suspected drug-induced), clinically significant: Discontinue treatment.   Lymphocyte count <500/mm3 persisting for >6 months: Consider treatment interruption.   Serious infection: Consider withholding treatment until infection resolves.   Current adverse effects: Flushing, skin rash, or puritus: has not yet started. Counseling given.  S/sx of infection: has not yet started. Counseling given.  GI upset: has not yet started. Counseling given.  S/sx of heptotoxicity: has not yet started. Counseling given.  Lymphopenia: has not yet started. Counseling given.  PML/proteinuria: has not yet started. Counseling given.    O: Lab Results  Component Value Date   WBC 5.1 01/26/2021   HGB 15.5 01/26/2021   HCT 46.5 01/26/2021   MCV 90 01/26/2021   PLT 192 01/26/2021      Chemistry      Component Value Date/Time   NA 146 (H) 01/26/2021 0845   K 4.1 01/26/2021 0845   CL 107 (H) 01/26/2021 0845   CO2 24 01/26/2021 0845   BUN 19 01/26/2021 0845   CREATININE 0.98 01/26/2021 0845      Component Value Date/Time   CALCIUM 10.2 01/26/2021 0845   ALKPHOS 75 01/26/2021 0845   AST 19 01/26/2021 0845   ALT 25 01/26/2021 0845   BILITOT 0.5 01/26/2021 0845      A/P: 1. Medication review: Patient is about to start Tecfidera for MS. Reviewed the medication with the patient, including the following: dimethyl fumarate activates the Nrf2 pathway, which is believed to result in anti-inflammatory and cytoprotective properties. The medication is oral and should be swallowed whole. Administering with a high-fat, high-protein meal may decrease flushing and GI side effects. Possible adverse effects include skin flushing, pruritus, GI upset, albuminura, infection, lymphocytopenia, and increased liver transaminases. Cases of PML have been reported with severe, long-standing lymphopenia identified as the primary risk for PML. Dose adjustments for toxicities have been summarized above. No recommendations for any changes at this time.    Benard Halsted, PharmD, [facility] [place], [place] [phone]

## 2021-03-23 ENCOUNTER — Other Ambulatory Visit: Payer: Self-pay | Admitting: Neurology

## 2021-03-23 ENCOUNTER — Other Ambulatory Visit (HOSPITAL_COMMUNITY): Payer: Self-pay

## 2021-03-23 ENCOUNTER — Encounter: Payer: Self-pay | Admitting: Emergency Medicine

## 2021-03-23 MED ORDER — HYDROXYZINE HCL 25 MG PO TABS
25.0000 mg | ORAL_TABLET | Freq: Four times a day (QID) | ORAL | 5 refills | Status: DC | PRN
  Filled 2021-03-23: qty 30, 8d supply, fill #0
  Filled 2021-06-25: qty 30, 8d supply, fill #1
  Filled 2021-07-27: qty 30, 8d supply, fill #2
  Filled 2021-08-30: qty 30, 8d supply, fill #3
  Filled 2021-10-11: qty 30, 8d supply, fill #4
  Filled 2021-12-02: qty 30, 8d supply, fill #5

## 2021-03-24 ENCOUNTER — Other Ambulatory Visit (HOSPITAL_COMMUNITY): Payer: Self-pay

## 2021-03-25 ENCOUNTER — Other Ambulatory Visit (HOSPITAL_COMMUNITY): Payer: Self-pay

## 2021-03-29 NOTE — Telephone Encounter (Signed)
I called patient.  She received dimethyl fumarate and started taking it today.  She is tolerating it well so far.  I reminded her of her follow-up with Dr. Felecia Shelling in January.  She will let us know if she needs [place] in the meantime.

## 2021-03-31 ENCOUNTER — Other Ambulatory Visit (HOSPITAL_COMMUNITY): Payer: Self-pay

## 2021-04-06 ENCOUNTER — Other Ambulatory Visit (HOSPITAL_COMMUNITY): Payer: Self-pay

## 2021-04-08 ENCOUNTER — Other Ambulatory Visit (HOSPITAL_COMMUNITY): Payer: Self-pay

## 2021-04-14 ENCOUNTER — Telehealth: Payer: Self-pay | Admitting: Cardiology

## 2021-04-14 NOTE — Telephone Encounter (Signed)
Pt c/o Shortness Of Breath: STAT if SOB developed within the last 24 hours or pt is noticeably SOB on the phone  1. Are you currently SOB (can you hear that pt is SOB on the phone)? SOB  2. How long have you been experiencing SOB? A week   3. Are you SOB when sitting or when up moving around? Both  4. Are you currently experiencing any other symptoms? Yes her heart rate is at 40

## 2021-04-14 NOTE — Telephone Encounter (Signed)
Called patient back, pt reports for the past week she has had shortness of breath and her heart rate has been in the 40s. Pt reports she has woken up with shortness of breath and can feel her heart rate is low. She denies weight gain (3lbs in 1 day, 5lbs in 1 week) or swelling. She denies chest pain or palpitations. She reports her blood pressure this morning was 161/67 with a heart rate of 40. She says she is occasionally dizzy, and says she cannot identify a pattern with the shortness of breath related to activity. RN consulted with Dr. Gardiner Rhyme, DOD, who advised pt have a sooner appointment and proceed to the emergency department for any worsening symptoms in the interim. Nurse passed recommendations on to patient. Made pt appointment with Dr. Percival Spanish 04/16/2021 at 2:30pm. Pt made aware if her shortness of breath worsens, she has syncope or presyncope, she develops chest pain or any other concerning symptoms, she should call 911 or be taken to the emergency room. Pt verbalized understanding.

## 2021-04-15 DIAGNOSIS — R002 Palpitations: Secondary | ICD-10-CM | POA: Insufficient documentation

## 2021-04-15 DIAGNOSIS — E118 Type 2 diabetes mellitus with unspecified complications: Secondary | ICD-10-CM | POA: Insufficient documentation

## 2021-04-15 DIAGNOSIS — E119 Type 2 diabetes mellitus without complications: Secondary | ICD-10-CM | POA: Insufficient documentation

## 2021-04-15 NOTE — Progress Notes (Addendum)
Cardiology Office Note   Date:  04/16/2021   ID:  Leo Bellucci, DOB 1950/12/03, MRN [id number]  PCP:  Leonard Downing, MD  Cardiologist:   Minus Breeding, MD   Chief Complaint  Patient presents with   Dizziness      History of Present Illness: Imagine Katherine Yu is a 70 y.o. female who is referred by Leonard Downing, MD for evaluation of chest pain.  The patient was seen in 2010 for chest discomfort and had a negative POET (Plain Old Exercise Treadmill) by her report although I do not have these results.  I saw her in June.  She had dyspnea.   She had a negative POET (Plain Old Exercise Treadmill).     She was having palpitations in June as well.  She had a monitor that demonstrated first-degree AV block.  There were some blocked premature atrial contractions, Mobitz type I and some episodes of 2-1 heart block.  She also had some runs of supraventricular tachycardia.  She called yesterday to be added to my schedule.  She says for about 8 days she has had a pounding in her chest.  She has been fatigued.  She has had some shortness of breath with activities.  She has been doing her usual chores but finding it more difficult.  She has not had any frank syncope or presyncope.  Her heart rates have been in the 40s.  She is not having any new chest pressure, neck or arm discomfort.  She is had no weight gain or edema.  She has had no tacky palpitations.   Past Medical History:  Diagnosis Date   ARTHRITIS 02/20/2009   Qualifier: Diagnosis of  By: Hester Mates, Carol     CHEST PAIN-PRECORDIAL 03/02/2009   Qualifier: Diagnosis of  By: Verl Blalock, MD, Estevan Oaks C    Chronic insomnia    Degenerative arthritis    DEPRESSION 02/20/2009   Qualifier: Diagnosis of  By: Orville Govern, CMA, Carol     Dyslipidemia    FATIGUE 02/20/2009   Qualifier: Diagnosis of  By: Orville Govern CMA, Carol     GERD 02/20/2009   Qualifier: Diagnosis of  By: Orville Govern CMA, Carol     History of lower leg  fracture    History of peptic ulcer    History of seizures    In childhood   HYPERLIPIDEMIA-MIXED 05/04/2010   Qualifier: Diagnosis of  By: Verl Blalock, MD, Estevan Oaks C    Hypertension    Migraine    Mild obesity    MULTIPLE SCLEROSIS 02/20/2009   Qualifier: Diagnosis of  By: Orville Govern, CMA, Carol     Plantar fasciitis    Temporomandibular joint disease    Urinary incontinence     Past Surgical History:  Procedure Laterality Date   APPENDECTOMY     GALLBLADDER SURGERY     metal plate resection     skull   pilonidal cyst       Current Outpatient Medications  Medication Sig Dispense Refill   aspirin 325 MG tablet Take 325 mg by mouth as needed.     benazepril (LOTENSIN) 40 MG tablet TAKE 1 TABLET BY MOUTH ONCE DAILY. 90 tablet 1   Cholecalciferol (VITAMIN D3) 5000 UNITS TABS Take 5,000 Units by mouth daily.     Dimethyl Fumarate (TECFIDERA) 240 MG CPDR Take 1 capsule by mouth 2 times daily. (Start after completing '120mg'$  capsule therapy) 60 capsule 3   Dimethyl Fumarate 120 MG CPDR  Take 1 capsule (120 mg) by mouth 2 (two) times daily. For two weeks prior to starting maintenance dose. 28 capsule 0   eletriptan (RELPAX) 40 MG tablet TAKE 1 TABLET (40 MG TOTAL) BY MOUTH AS NEEDED FOR MIGRAINE. MAY REPEAT IN 2 HOURS IF HEADACHE PERSISTS OR RECURS. 24 tablet 5   famotidine (PEPCID) 20 MG tablet Take 20 mg by mouth as needed.      hydrochlorothiazide (HYDRODIURIL) 50 MG tablet TAKE 1/2 TABLET BY MOUTH AT BEDTIME 45 tablet 3   hydrOXYzine (ATARAX/VISTARIL) 25 MG tablet Take 1 tablet (25 mg total) by mouth every 6 (six) hours as needed for itching. 30 tablet 5   hydrOXYzine (VISTARIL) 25 MG capsule 1 capsule as needed     meclizine (ANTIVERT) 25 MG tablet Take 25 mg by mouth 3 (three) times daily as needed for dizziness.     No current facility-administered medications for this visit.    Allergies:   Niacin   ROS:  Please see the history of present illness.   Otherwise, review of systems are  positive for none.   All other systems are reviewed and negative.    PHYSICAL EXAM: VS:  BP (!) 150/50 (BP Location: Right Arm)   Pulse (!) 43   Ht 5' 6.5" (1.689 m)   Wt 155 lb (70.3 kg)   SpO2 98%   BMI 24.64 kg/m  , BMI Body mass index is 24.64 kg/m. GENERAL:  Well appearing NECK:  No jugular venous distention, waveform within normal limits, carotid upstroke brisk and symmetric, no bruits, no thyromegaly LUNGS:  Clear to auscultation bilaterally CHEST:  Unremarkable HEART:  PMI not displaced or sustained,S1 and S2 within normal limits, no S3, no S4, no clicks, no rubs, no murmurs ABD:  Flat, positive bowel sounds normal in frequency in pitch, no bruits, no rebound, no guarding, no midline pulsatile mass, no hepatomegaly, no splenomegaly EXT:  2 plus pulses throughout, no edema, no cyanosis no clubbing  EKG:  EKG is done today.  Sinus rhythm, rate 100, complete heart block with junctional escape rhythm with premature ectopic complex, left axis deviation, no acute ST-T wave changes.  Recent Labs: 01/26/2021: ALT 25; BUN 19; Creatinine, Ser 0.98; Hemoglobin 15.5; Platelets 192; Potassium 4.1; Sodium 146    Lipid Panel    Component Value Date/Time   CHOL 146 08/10/2020 0903   TRIG 85 08/10/2020 0903   HDL 42 08/10/2020 0903   CHOLHDL 3.5 08/10/2020 0903   CHOLHDL 4.6 CALC 01/17/2008 1250   VLDL 27 01/17/2008 1250   LDLCALC 88 08/10/2020 0903      Wt Readings from Last 3 Encounters:  04/16/21 155 lb (70.3 kg)  03/11/21 160 lb (72.6 kg)  02/19/21 157 lb (71.2 kg)      Other studies Reviewed: Additional studies/ records that were reviewed today include:  None Review of the above records demonstrates:  Please see elsewhere in the note.     ASSESSMENT AND PLAN:  CHEST PAIN:   She previously had a negative POET (Plain Old Exercise Treadmill).  I am not suggesting an ischemic etiology to event any of her symptoms.  PALPITATIONS:    EKG today now demonstrates high degree  heart block with a narrow complex escape rhythm.  She is not on any agents to be exacerbating this.  She needs permanent pacing.  We have scheduled this for Monday.  Given the fact that she has had no syncope and the symptoms been ongoing and it has been  narrow escape we can proceed with this electively.  She does not want to stay in and I think it is quite safe for her to be at home.  She is not to be driving.  She would call 911 if she has any worsening symptoms or presyncope or syncope.  HTN: Her blood pressure is controlled.  No change in therapy.  DM:   Her A1c most recently was 5.8.  No change in therapy.   Current medicines are reviewed at length with the patient today.  The patient does not have concerns regarding medicines.  The following changes have been made:  None  Labs/ tests ordered today include: None  Orders Placed This Encounter  Procedures   Basic metabolic panel   CBC   TSH   EKG 12-Lead      Disposition:   FU with me after the pacemaker.     Signed, Minus Breeding, MD  04/16/2021 3:43 PM    [facility]

## 2021-04-16 ENCOUNTER — Other Ambulatory Visit: Payer: Self-pay

## 2021-04-16 ENCOUNTER — Ambulatory Visit: Payer: 59 | Admitting: Cardiology

## 2021-04-16 ENCOUNTER — Encounter: Payer: Self-pay | Admitting: Cardiology

## 2021-04-16 VITALS — BP 150/50 | HR 43 | Ht 66.5 in | Wt 155.0 lb

## 2021-04-16 DIAGNOSIS — I10 Essential (primary) hypertension: Secondary | ICD-10-CM

## 2021-04-16 DIAGNOSIS — E118 Type 2 diabetes mellitus with unspecified complications: Secondary | ICD-10-CM | POA: Diagnosis not present

## 2021-04-16 DIAGNOSIS — R072 Precordial pain: Secondary | ICD-10-CM

## 2021-04-16 DIAGNOSIS — R002 Palpitations: Secondary | ICD-10-CM | POA: Diagnosis not present

## 2021-04-16 NOTE — Patient Instructions (Signed)
Medications: Your physician recommends that you continue on your current medications as directed. Please refer to the Current Medication list given to you today.     * If you need a refill on your cardiac medications before your next appointment, please call your pharmacy. *  Testing/Procedures: Your physician has recommended that you have a pacemaker inserted. A pacemaker is a small device that is placed under the skin of your chest or abdomen to help control abnormal heart rhythms. This device uses electrical pulses to prompt the heart to beat at a normal rate. Pacemakers are used to treat heart rhythms that are too slow. Wire (leads) are attached to the pacemaker that goes into the chambers of you heart. This is done in the hospital and usually requires and overnight stay. Please follow the instructions below, located under the special instructions section.  Follow-Up: Your physician recommends that you schedule a wound check appointment 10-14 days, after your procedure on 04/19/21, with the device clinic.   Your physician recommends that you schedule a follow up appointment in 91 days, after your procedure on 04/19/21, with Dr. Quentin Ore.  Thank you for choosing [facility]!!      Any Other Special Instructions Will Be Listed Below (If Applicable).     Implantable Device Instructions  You are scheduled for a Permanent Pacemaker implant on 04/19/21 with Dr. Quentin Ore. Please arrive at the [facility], Entrance "A"  at [facility] at  10:30 am on the day of your procedure. The address is [address]                  Do not eat or drink after midnight the night before your procedure.  2.   Labs: Done 04/19/21  3.  Medications: Hold the hydrochlorothiazide the morning of the procedure  4.  Plan for an overnight stay.  Bring your insurance cards and a list of you medications.  5.  Wash your chest and neck with surgical scrub the evening before and the morning of  your procedure.  Rinse well. Please review the surgical scrub instruction sheet given to you. You may pick this up at the [facility] office or any drug store.  6. Your chest will need to be shaved prior to this procedure (if needed). We ask that you do this yourself at home 1 to 2 days before or if uncomfortable/unable to do yourself, then it will be performed by the hospital staff the day of.                                                                                                       * Every attempt is made to prevent procedures from being rescheduled.  Due to the nature of  Electrophysiology, rescheduling can happen.  The physician is always aware and directs the staff when this occurs.     Pacemaker Implantation, Adult Pacemaker implantation is a procedure to place a pacemaker inside your chest. A pacemaker is a small computer that sends electrical signals to the heart and helps your heart beat  normally. A pacemaker also stores information about your heart rhythms. You may need pacemaker implantation if you: Have a slow heartbeat (bradycardia). Faint (syncope). Have shortness of breath (dyspnea) due to heart problems.  The pacemaker attaches to your heart through a wire, called a lead. Sometimes just one lead is needed. Other times, there will be two leads. There are two types of pacemakers: Transvenous pacemaker. This type is placed under the skin or muscle of your chest. The lead goes through a vein in the chest area to reach the inside of the heart. Epicardial pacemaker. This type is placed under the skin or muscle of your chest or belly. The lead goes through your chest to the outside of the heart.  Tell a health care provider about: Any allergies you have. All medicines you are taking, including vitamins, herbs, eye drops, creams, and over-the-counter medicines. Any problems you or family members have had with anesthetic medicines. Any blood or bone disorders you have. Any  surgeries you have had. Any medical conditions you have. Whether you are pregnant or may be pregnant. What are the risks? Generally, this is a safe procedure. However, problems may occur, including: Infection. Bleeding. Failure of the pacemaker or the lead. Collapse of a lung or bleeding into a lung. Blood clot inside a blood vessel with a lead. Damage to the heart. Infection inside the heart (endocarditis). Allergic reactions to medicines.  What happens before the procedure? Staying hydrated Follow instructions from your health care provider about hydration, which may include: Up to 2 hours before the procedure - you may continue to drink clear liquids, such as water, clear fruit juice, black coffee, and plain tea.  Eating and drinking restrictions Follow instructions from your health care provider about eating and drinking, which may include: 8 hours before the procedure - stop eating heavy meals or foods such as meat, fried foods, or fatty foods. 6 hours before the procedure - stop eating light meals or foods, such as toast or cereal. 6 hours before the procedure - stop drinking milk or drinks that contain milk. 2 hours before the procedure - stop drinking clear liquids.  Medicines Ask your health care provider about: Changing or stopping your regular medicines. This is especially important if you are taking diabetes medicines or blood thinners. Taking medicines such as aspirin and ibuprofen. These medicines can thin your blood. Do not take these medicines before your procedure if your health care provider instructs you not to. You may be given antibiotic medicine to help prevent infection. General instructions You will have a heart evaluation. This may include an electrocardiogram (ECG), chest X-ray, and heart imaging (echocardiogram,  or echo) tests. You will have blood tests. Do not use any products that contain nicotine or tobacco, such as cigarettes and e-cigarettes. If you  need help quitting, ask your health care provider. Plan to have someone take you home from the hospital or clinic. If you will be going home right after the procedure, plan to have someone with you for 24 hours. Ask your health care provider how your surgical site will be marked or identified. What happens during the procedure? To reduce your risk of infection: Your health care team will wash or sanitize their hands. Your skin will be washed with soap. Hair may be removed from the surgical area. An IV tube will be inserted into one of your veins. You will be given one or more of the following: A medicine to help you relax (sedative). A medicine  to numb the area (local anesthetic). A medicine to make you fall asleep (general anesthetic). If you are getting a transvenous pacemaker: An incision will be made in your upper chest. A pocket will be made for the pacemaker. It may be placed under the skin or between layers of muscle. The lead will be inserted into a blood vessel that returns to the heart. While X-rays are taken by an imaging machine (fluoroscopy), the lead will be advanced through the vein to the inside of your heart. The other end of the lead will be tunneled under the skin and attached to the pacemaker. If you are getting an epicardial pacemaker: An incision will be made near your ribs or breastbone (sternum) for the lead. The lead will be attached to the outside of your heart. Another incision will be made in your chest or upper belly to create a pocket for the pacemaker. The free end of the lead will be tunneled under the skin and attached to the pacemaker. The transvenous or epicardial pacemaker will be tested. Imaging studies may be done to check the lead position. The incisions will be closed with stitches (sutures), adhesive strips, or skin glue. Bandages (dressing) will be placed over the incisions. The procedure may vary among health care providers and hospitals. What  happens after the procedure? Your blood pressure, heart rate, breathing rate, and blood oxygen level will be monitored until the medicines you were given have worn off. You will be given antibiotics and pain medicine. ECG and chest x-rays will be done. You will wear a continuous type of ECG (Holter monitor) to check your heart rhythm. Your health care provider will program the pacemaker. Do not drive for 24 hours if you received a sedative. This information is not intended to replace advice given to you by your health care provider. Make sure you discuss any questions you have with your health care provider. Document Released: 08/12/2002 Document Revised: 03/11/2016 Document Reviewed: 02/03/2016 Elsevier Interactive Patient Education  2018 [employer].     Pacemaker Implantation, Adult, Care After This sheet gives you information about how to care for yourself after your procedure. Your health care provider may also give you more specific instructions. If you have problems or questions, contact your health care provider. What can I expect after the procedure? After the procedure, it is common to have: Mild pain. Slight bruising. Some swelling over the incision. A slight bump over the skin where the device was placed. Sometimes, it is possible to feel the device under the skin. This is normal.  Follow these instructions at home: Medicines Take over-the-counter and prescription medicines only as told by your health care provider. If you were prescribed an antibiotic medicine, take it as told by your health care provider. Do not stop taking the antibiotic even if you start to feel better. Wound care Do not remove the bandage on your chest until directed to do so by your health care provider. After your bandage is removed, you may see pieces of tape called skin adhesive strips over the area where the cut was made (incision site). Let them fall off on their own. Check the incision site  every day to make sure it is not infected, bleeding, or starting to pull apart. Do not use lotions or ointments near the incision site unless directed to do so. Keep the incision area clean and dry for 2-3 days after the procedure or as directed by your health care provider. It takes several weeks  for the incision site to completely heal. Do not take baths, swim, or use a hot tub for 7-10 days or as otherwise directed by your health care provider. Activity Do not drive or use heavy machinery while taking prescription pain medicine. Do not drive for 24 hours if you were given a medicine to help you relax (sedative). Check with your health care provider before you start to drive or play sports. Avoid sudden jerking, pulling, or chopping movements that pull your upper arm far away from your body. Avoid these movements for at least 6 weeks or as long as told by your health care provider. Do not lift your upper arm above your shoulders for at least 6 weeks or as long as told by your health care provider. This means no tennis, golf, or swimming. You may go back to work when your health care provider says it is okay. Pacemaker care You may be shown how to transfer data from your pacemaker through the phone to your health care provider. Always let all health care providers know about your pacemaker before you have any medical procedures or tests. Wear a medical ID bracelet or necklace stating that you have a pacemaker. Carry a pacemaker ID card with you at all times. Your pacemaker battery will last for 5-15 years. Routine checks by your health care provider will let the health care provider know when the battery is starting to run down. The pacemaker will need to be replaced when the battery starts to run down. Do not use amateur Chief of Staff. Other electrical devices are safe to use, including power tools, lawn mowers, and speakers. If you are unsure of whether something is  safe to use, ask your health care provider. When using your cell phone, hold it to the ear opposite the pacemaker. Do not leave your cell phone in a pocket over the pacemaker. Avoid places or objects that have a strong electric or magnetic field, including: Engineer, maintenance. When at the airport, let officials know that you have a pacemaker. Power plants. Large electrical generators. Radiofrequency transmission towers, such as cell phone and radio towers. General instructions Weigh yourself every day. If you suddenly gain weight, fluid may be building up in your body. Keep all follow-up visits as told by your health care provider. This is important. Contact a health care provider if: You gain weight suddenly. Your legs or feet swell. It feels like your heart is fluttering or skipping beats (heart palpitations). You have chills or a fever. You have more redness, swelling, or pain around your incisions. You have more fluid or blood coming from your incisions. Your incisions feel warm to the touch. You have pus or a bad smell coming from your incisions. Get help right away if: You have chest pain. You have trouble breathing or are short of breath. You become extremely tired. You are light-headed or you faint. This information is not intended to replace advice given to you by your health care provider. Make sure you discuss any questions you have with your health care provider. Document Released: 03/11/2005 Document Revised: 06/03/2016 Document Reviewed: 06/03/2016 Elsevier Interactive Patient Education  2018 [place] Discharge Instructions for  Pacemaker/Defibrillator Patients  ACTIVITY No heavy lifting or vigorous activity with your left/right arm for 6 to 8 weeks.  Do not raise your left/right arm above your head for one week.  Gradually raise your affected arm as drawn below.  __  NO DRIVING for     ; you may begin driving on     .  WOUND  CARE Keep the wound area clean and dry.  Do not get this area wet for one week. No showers for one week; you may shower on     . The tape/steri-strips on your wound will fall off; do not pull them off.  No bandage is needed on the site.  DO  NOT apply any creams, oils, or ointments to the wound area. If you notice any drainage or discharge from the wound, any swelling or bruising at the site, or you develop a fever > 101? F after you are discharged home, call the office at once.  SPECIAL INSTRUCTIONS You are still able to use cellular telephones; use the ear opposite the side where you have your pacemaker/defibrillator.  Avoid carrying your cellular phone near your device. When traveling through airports, show security personnel your identification card to avoid being screened in the metal detectors.  Ask the security personnel to use the hand wand. Avoid arc welding equipment, MRI testing (magnetic resonance imaging), TENS units (transcutaneous nerve stimulators).  Call the office for questions about other devices. Avoid electrical appliances that are in poor condition or are not properly grounded. Microwave ovens are safe to be near or to operate.  ADDITIONAL INFORMATION FOR DEFIBRILLATOR PATIENTS SHOULD YOUR DEVICE GO OFF: If your device goes off ONCE and you feel fine afterward, notify the device clinic nurses. If your device goes off ONCE and you do not feel well afterward, call 911. If your device goes off TWICE, call 911. If your device goes off THREE TIMES IN ONE DAY, call 911.  DO NOT DRIVE YOURSELF OR A FAMILY MEMBER WITH A DEFIBRILLATOR TO THE HOSPITAL--CALL 911.    [facility] - Preparing For Surgery  Before surgery, you can play an important role. Because skin is not sterile, your skin needs to be as free of germs as possible. You can reduce the number of germs on your skin by washing with CHG (chlorahexidine gluconate) Soap before surgery.  CHG is an antiseptic cleaner which  kills germs and bonds with the skin to continue killing germs even after washing.   Please do not use if you have an allergy to CHG or antibacterial soaps.  If your skin becomes reddened/irritated stop using the CHG.   Do not shave (including legs and underarms) for at least 48 hours prior to first CHG shower.  It is OK to shave your face.  Please follow these instructions carefully:  1.  Shower the night before surgery and the morning of surgery with CHG.  2.  If you choose to wash your hair, wash your hair first as usual with your normal shampoo.  3.  After you shampoo, rinse your hair and body thoroughly to remove the shampoo.  4.  Use CHG as you would any other liquid soap.  You can apply CHG directly to the skin and wash gently with a clean washcloth. 5.  Apply the CHG Soap to your body ONLY FROM THE NECK DOWN.  Do not use on open wounds or open sores.  Avoid contact with your eyes, ears, mouth and genitals (private parts).  Wash genitals (private parts) with your normal soap.  6.  Wash thoroughly, paying special attention to the area where your surgery will be performed.  7.  Thoroughly rise your body with warm water from the neck down.   8.  DO NOT shower/wash with your normal soap after using and rinsing off the CHG soap.  9.  Pat yourself dry with a clean towel.           10.  Wear clean pajamas.           11.  Place clean sheets on your bed the night of your first shower and do not sleep with pets.  Day of Surgery: Do not apply any deodorants/lotions.  Please wear clean clothes to the hospital/surgery center.

## 2021-04-18 ENCOUNTER — Telehealth: Payer: Self-pay | Admitting: Cardiology

## 2021-04-18 NOTE — Telephone Encounter (Signed)
Attempted to call patient to discuss pacemaker procedure scheduled for tomorrow. No answer. Plan to go through procedural details/consent tomorrow in prep area.  Katherine Galas T. Quentin Ore, MD, [facility], [facility] Cardiac Electrophysiology

## 2021-04-19 ENCOUNTER — Ambulatory Visit (HOSPITAL_COMMUNITY)
Admission: RE | Disposition: A | Discharge status: Home / Self Care | Payer: Self-pay | Source: Home / Self Care | Attending: Cardiology

## 2021-04-19 ENCOUNTER — Other Ambulatory Visit: Payer: Self-pay

## 2021-04-19 ENCOUNTER — Ambulatory Visit (HOSPITAL_COMMUNITY)
Admission: RE | Admit: 2021-04-19 | Discharge: 2021-04-20 | Disposition: A | Discharge status: Home / Self Care | Payer: 59 | Attending: Cardiology | Admitting: Cardiology

## 2021-04-19 DIAGNOSIS — Z950 Presence of cardiac pacemaker: Secondary | ICD-10-CM

## 2021-04-19 DIAGNOSIS — I10 Essential (primary) hypertension: Secondary | ICD-10-CM | POA: Insufficient documentation

## 2021-04-19 DIAGNOSIS — Z6825 Body mass index (BMI) 25.0-25.9, adult: Secondary | ICD-10-CM | POA: Diagnosis not present

## 2021-04-19 DIAGNOSIS — R569 Unspecified convulsions: Secondary | ICD-10-CM | POA: Insufficient documentation

## 2021-04-19 DIAGNOSIS — E785 Hyperlipidemia, unspecified: Secondary | ICD-10-CM | POA: Diagnosis not present

## 2021-04-19 DIAGNOSIS — G35 Multiple sclerosis: Secondary | ICD-10-CM | POA: Insufficient documentation

## 2021-04-19 DIAGNOSIS — E669 Obesity, unspecified: Secondary | ICD-10-CM | POA: Insufficient documentation

## 2021-04-19 DIAGNOSIS — I442 Atrioventricular block, complete: Secondary | ICD-10-CM | POA: Diagnosis not present

## 2021-04-19 DIAGNOSIS — Z888 Allergy status to other drugs, medicaments and biological substances status: Secondary | ICD-10-CM | POA: Insufficient documentation

## 2021-04-19 HISTORY — PX: PACEMAKER IMPLANT: EP1218

## 2021-04-19 LAB — BASIC METABOLIC PANEL
Anion gap: 9 (ref 5–15)
BUN: 25 mg/dL — ABNORMAL HIGH (ref 8–23)
CO2: 28 mmol/L (ref 22–32)
Calcium: 9.6 mg/dL (ref 8.9–10.3)
Chloride: 104 mmol/L (ref 98–111)
Creatinine, Ser: 1.05 mg/dL — ABNORMAL HIGH (ref 0.44–1.00)
GFR, Estimated: 58 mL/min — ABNORMAL LOW (ref >60)
Glucose, Bld: 111 mg/dL — ABNORMAL HIGH (ref 70–99)
Potassium: 3.6 mmol/L (ref 3.5–5.1)
Sodium: 141 mmol/L (ref 135–145)

## 2021-04-19 LAB — GLUCOSE, CAPILLARY: Glucose-Capillary: 88 mg/dL (ref 70–99)

## 2021-04-19 LAB — CBC
HCT: 47.9 % — ABNORMAL HIGH (ref 36.0–46.0)
Hemoglobin: 15.9 g/dL — ABNORMAL HIGH (ref 12.0–15.0)
MCH: 30.1 pg (ref 26.0–34.0)
MCHC: 33.2 g/dL (ref 30.0–36.0)
MCV: 90.7 fL (ref 80.0–100.0)
Platelets: 192 10*3/uL (ref 150–400)
RBC: 5.28 MIL/uL — ABNORMAL HIGH (ref 3.87–5.11)
RDW: 13.3 % (ref 11.5–15.5)
WBC: 3.9 10*3/uL — ABNORMAL LOW (ref 4.0–10.5)
nRBC: 0 % (ref 0.0–0.2)

## 2021-04-19 SURGERY — PACEMAKER IMPLANT

## 2021-04-19 MED ORDER — ACETAMINOPHEN 325 MG PO TABS
325.0000 mg | ORAL_TABLET | ORAL | Status: DC | PRN
  Administered 2021-04-19 – 2021-04-20 (×2): 650 mg via ORAL
  Filled 2021-04-19 (×2): qty 2

## 2021-04-19 MED ORDER — CEFAZOLIN SODIUM-DEXTROSE 2-4 GM/100ML-% IV SOLN
INTRAVENOUS | Status: AC
  Filled 2021-04-19: qty 100

## 2021-04-19 MED ORDER — SODIUM CHLORIDE 0.9 % IV SOLN: INTRAVENOUS | Status: DC

## 2021-04-19 MED ORDER — ONDANSETRON HCL 4 MG/2ML IJ SOLN: 4.0000 mg | Freq: Four times a day (QID) | INTRAMUSCULAR | Status: DC | PRN

## 2021-04-19 MED ORDER — MIDAZOLAM HCL 5 MG/5ML IJ SOLN
INTRAMUSCULAR | Status: AC
  Filled 2021-04-19: qty 5

## 2021-04-19 MED ORDER — LIDOCAINE HCL 1 % IJ SOLN
INTRAMUSCULAR | Status: AC
  Filled 2021-04-19: qty 60

## 2021-04-19 MED ORDER — HEPARIN (PORCINE) IN NACL 1000-0.9 UT/500ML-% IV SOLN
INTRAVENOUS | Status: AC
  Filled 2021-04-19: qty 500

## 2021-04-19 MED ORDER — LIDOCAINE HCL (PF) 1 % IJ SOLN
INTRAMUSCULAR | Status: DC | PRN
  Administered 2021-04-19: 45 mL

## 2021-04-19 MED ORDER — BENAZEPRIL HCL 40 MG PO TABS
40.0000 mg | ORAL_TABLET | Freq: Every day | ORAL | Status: DC
  Filled 2021-04-19: qty 1

## 2021-04-19 MED ORDER — ONDANSETRON HCL 4 MG/2ML IJ SOLN
INTRAMUSCULAR | Status: AC
  Filled 2021-04-19: qty 2

## 2021-04-19 MED ORDER — FENTANYL CITRATE (PF) 100 MCG/2ML IJ SOLN
INTRAMUSCULAR | Status: DC | PRN
  Administered 2021-04-19 (×3): 25 ug via INTRAVENOUS

## 2021-04-19 MED ORDER — POVIDONE-IODINE 10 % EX SWAB: 2.0000 "application " | Freq: Once | CUTANEOUS | Status: DC

## 2021-04-19 MED ORDER — MIDAZOLAM HCL 5 MG/5ML IJ SOLN
INTRAMUSCULAR | Status: DC | PRN
  Administered 2021-04-19 (×3): 1 mg via INTRAVENOUS

## 2021-04-19 MED ORDER — CHLORHEXIDINE GLUCONATE 4 % EX LIQD: 4.0000 "application " | Freq: Once | CUTANEOUS | Status: DC

## 2021-04-19 MED ORDER — HYDROCHLOROTHIAZIDE 25 MG PO TABS
12.5000 mg | ORAL_TABLET | Freq: Every day | ORAL | Status: DC
  Administered 2021-04-19: 12.5 mg via ORAL
  Filled 2021-04-19: qty 1

## 2021-04-19 MED ORDER — HEPARIN (PORCINE) IN NACL 1000-0.9 UT/500ML-% IV SOLN
INTRAVENOUS | Status: DC | PRN
  Administered 2021-04-19: 500 mL

## 2021-04-19 MED ORDER — ONDANSETRON HCL 4 MG/2ML IJ SOLN
INTRAMUSCULAR | Status: DC | PRN
Start: 2021-04-19 — End: 2021-04-19
  Administered 2021-04-19: 4 mg via INTRAVENOUS

## 2021-04-19 MED ORDER — SODIUM CHLORIDE 0.9 % IV SOLN
INTRAVENOUS | Status: AC
  Filled 2021-04-19: qty 2

## 2021-04-19 MED ORDER — FENTANYL CITRATE (PF) 100 MCG/2ML IJ SOLN
INTRAMUSCULAR | Status: AC
  Filled 2021-04-19: qty 2

## 2021-04-19 MED ORDER — SODIUM CHLORIDE 0.9 % IV SOLN
80.0000 mg | INTRAVENOUS | Status: AC
  Administered 2021-04-19: 80 mg

## 2021-04-19 MED ORDER — CEFAZOLIN SODIUM-DEXTROSE 2-4 GM/100ML-% IV SOLN
2.0000 g | INTRAVENOUS | Status: AC
  Administered 2021-04-19: 2 g via INTRAVENOUS

## 2021-04-19 SURGICAL SUPPLY — 14 items
CABLE SURGICAL S-101-97-12 (CABLE) ×4 IMPLANT
CATH RIGHTSITE C315HIS02 (CATHETERS) ×2 IMPLANT
IPG PACE AZUR XT DR MRI W1DR01 (Pacemaker) ×1 IMPLANT
KIT MICROPUNCTURE NIT STIFF (SHEATH) ×2 IMPLANT
LEAD CAPSURE NOVUS 5076-52CM (Lead) ×2 IMPLANT
LEAD SELECT SECURE 3830 383069 (Lead) ×1 IMPLANT
PACE AZURE XT DR MRI W1DR01 (Pacemaker) ×2 IMPLANT
PAD PRO RADIOLUCENT 2001M-C (PAD) ×2 IMPLANT
SELECT SECURE 3830 383069 (Lead) ×2 IMPLANT
SHEATH 7FR PRELUDE SNAP 13 (SHEATH) ×4 IMPLANT
SHEATH PROBE COVER 6X72 (BAG) ×2 IMPLANT
SLITTER 6232ADJ (MISCELLANEOUS) ×2 IMPLANT
TRAY PACEMAKER INSERTION (PACKS) ×2 IMPLANT
WIRE HI TORQ VERSACORE-J 145CM (WIRE) ×2 IMPLANT

## 2021-04-19 NOTE — Discharge Summary (Signed)
ELECTROPHYSIOLOGY PROCEDURE DISCHARGE SUMMARY    Patient ID: Katherine Yu,  MRN: [id number], DOB/AGE: December 03, 1950 70 y.o.  Admit date: 04/19/2021 Discharge date: 04/20/21  Primary Care Physician: Leonard Downing, MD  Primary Cardiologist: Dr. Percival Spanish Electrophysiologist: Dr. Quentin Ore  Primary Discharge Diagnosis:  CHB UTI  Secondary Discharge Diagnosis:  MS Obesity HTN Seizure d/o  Allergies  Allergen Reactions   Niacin Anaphylaxis, Hives, Itching and Rash   Tecfidera [Dimethyl Fumarate]     Causes severe GI upset     Procedures This Admission:  1.  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    There were no immediate post procedure complications. CXR on 04/20/21 demonstrated no pneumothorax status post device implantation.   Brief HPI: Katherine Yu is a 70 y.o. female was referred to electrophysiology for PPM Past medical history includes above.  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  Risks, benefits, and alternatives to PPM implantation were reviewed with the patient who wished to proceed.   Hospital Course:  The patient was admitted and underwent and quick look bedside echo pre-procedure by Dr. Quentin Ore noted normal LVEF, following this implantation of a PPM with details as outlined above.   She was monitored on telemetry overnight which demonstrated SR/V paced.  Left chest was without hematoma or ecchymosis.  The device was interrogated and found to be functioning normally.  CXR was obtained and demonstrated no pneumothorax status post device implantation.  Wound care, arm mobility, and restrictions were reviewed with the patient.  The patient feels well, denies any CP/SOB, with minimal site discomfort.   The pt noted malodarous urine with symptoms she feels 2/2 UTI, UA was done and c/w UTI, cx pending.  D/w pharmacy will Rx Bactrim DS and she is advised to follow up with her  PMD.  She was examined by Dr.  Quentin Ore and considered stable for discharge to home.    Physical Exam: Vitals:   04/19/21 2100 04/19/21 2240 04/20/21 0038 04/20/21 0700  BP:  (!) 143/79 (!) 154/78 140/79  Pulse: 77 67 69 75  Resp: '14 12 18 18  '$ Temp:  98.5 F (36.9 C) 98 F (36.7 C) 98.2 F (36.8 C)  TempSrc:  Oral Oral Oral  SpO2: 98% 96% 98% 99%  Weight:   70.8 kg   Height:        GEN- The patient is well appearing, alert and oriented x 3 today.   HEENT: normocephalic, atraumatic; sclera clear, conjunctiva pink; hearing intact; oropharynx clear; neck supple, no JVP Lungs- CTA b/l, normal work of breathing.  No wheezes, rales, rhonchi Heart- RRR, no murmurs, rubs or gallops, PMI not laterally displaced GI- soft, non-tender, non-distended Extremities- no clubbing, cyanosis, or edema MS- no significant deformity or atrophy Skin- warm and dry, no rash or lesion, left chest without hematoma/ecchymosis Psych- euthymic mood, full affect Neuro- no gross deficits   Labs:   Lab Results  Component Value Date   WBC 3.9 (L) 04/19/2021   HGB 15.9 (H) 04/19/2021   HCT 47.9 (H) 04/19/2021   MCV 90.7 04/19/2021   PLT 192 04/19/2021    Recent Labs  Lab 04/19/21 1130  NA 141  K 3.6  CL 104  CO2 28  BUN 25*  CREATININE 1.05*  CALCIUM 9.6  GLUCOSE 111*    Discharge Medications:  Allergies as of 04/20/2021       Reactions   Niacin Anaphylaxis, Hives,  Itching, Rash   Tecfidera [dimethyl Fumarate]    Causes severe GI upset        Medication List     TAKE these medications    aspirin 325 MG tablet Take 325 mg by mouth every 6 (six) hours as needed for mild pain or headache.   benazepril 40 MG tablet Commonly known as: LOTENSIN TAKE 1 TABLET BY MOUTH ONCE DAILY.   Dimethyl Fumarate 240 MG Cpdr Commonly known as: Tecfidera Take 1 capsule by mouth 2 times daily. (Start after completing '120mg'$  capsule therapy)   eletriptan 40 MG tablet Commonly known as:  RELPAX TAKE 1 TABLET (40 MG TOTAL) BY MOUTH AS NEEDED FOR MIGRAINE. MAY REPEAT IN 2 HOURS IF HEADACHE PERSISTS OR RECURS.   famotidine 20 MG tablet Commonly known as: PEPCID Take 20 mg by mouth daily as needed for heartburn or indigestion.   hydrochlorothiazide 50 MG tablet Commonly known as: HYDRODIURIL TAKE 1/2 TABLET BY MOUTH AT BEDTIME   hydrOXYzine 25 MG tablet Commonly known as: ATARAX/VISTARIL Take 1 tablet (25 mg total) by mouth every 6 (six) hours as needed for itching.   ibuprofen 200 MG tablet Commonly known as: ADVIL Take 400 mg by mouth every 6 (six) hours as needed for mild pain.   meclizine 25 MG tablet Commonly known as: ANTIVERT Take 25 mg by mouth 3 (three) times daily as needed for dizziness.   sulfamethoxazole-trimethoprim 800-160 MG tablet Commonly known as: BACTRIM DS Take 1 tablet by mouth 2 (two) times daily.   Vitamin D3 125 MCG (5000 UT) Tabs Take 5,000 Units by mouth daily.       ASK your doctor about these medications    Dimethyl Fumarate 120 MG Cpdr Take 1 capsule (120 mg) by mouth 2 (two) times daily. For two weeks prior to starting maintenance dose.               Discharge Care Instructions  (From admission, onward)           Start     Ordered   04/20/21 0000  Discharge wound care:       Comments: As in AVS   04/20/21 1008            Disposition: Home Discharge Instructions     Diet - low sodium heart healthy   Complete by: As directed    Discharge wound care:   Complete by: As directed    As in AVS   Increase activity slowly   Complete by: As directed        Follow-up Information     [facility] Follow up.   Specialty: Cardiology Why: 04/29/21 @ 10;00AM, wound check visit Contact information: [address] [place]        Vickie Epley, MD Follow up.   Specialties: Cardiology, Radiology Why: 07/28/21 @ 1:15PM Contact  information: [address] [address] [phone]         Leonard Downing, MD Follow up in 1 week(s).   Specialty: Family Medicine Why: Please follow up with your PMD for UTI follow up Contact information: [place] [address] [phone]         Minus Breeding, MD .   Specialty: Cardiology Contact information: [address] [place] [address] [phone]                 Duration of  Discharge Encounter: Greater than 30 minutes including physician time.  Venetia Night, PA-C 04/20/2021 10:08 AM

## 2021-04-19 NOTE — Progress Notes (Addendum)
Patient requests home meds-benazapril at night, vit d3 at night, and hctz '25mg'$ . MD notified and discussed. Medications for tonight will be kept the same, but patient would like to speak with MD in the morning to discuss if she should change her home meds or go back to her normal regimen.

## 2021-04-19 NOTE — Discharge Instructions (Addendum)
    Supplemental Discharge Instructions for  Pacemaker/Defibrillator Patients   Activity No heavy lifting or vigorous activity with your left/right arm for 6 to 8 weeks.  Do not raise your left/right arm above your head for one week.  Gradually raise your affected arm as drawn below.              04/24/21                   04/25/21                   04/26/21                  04/27/21 __  NO DRIVING for 1 week   ; you may begin driving on  [id number]   .  WOUND CARE Keep the wound area clean and dry.  Do not get this area wet , no showers for one week; you may shower on  04/27/21   . The tape/steri-strips on your wound will fall off; do not pull them off.  No bandage is needed on the site.  DO  NOT apply any creams, oils, or ointments to the wound area. If you notice any drainage or discharge from the wound, any swelling or bruising at the site, or you develop a fever > 101? F after you are discharged home, call the office at once.  Special Instructions You are still able to use cellular telephones; use the ear opposite the side where you have your pacemaker/defibrillator.  Avoid carrying your cellular phone near your device. When traveling through airports, show security personnel your identification card to avoid being screened in the metal detectors.  Ask the security personnel to use the hand wand. Avoid arc welding equipment, MRI testing (magnetic resonance imaging), TENS units (transcutaneous nerve stimulators).  Call the office for questions about other devices. Avoid electrical appliances that are in poor condition or are not properly grounded. Microwave ovens are safe to be near or to operate.

## 2021-04-19 NOTE — Plan of Care (Signed)

## 2021-04-19 NOTE — H&P (Signed)
Electrophysiology Consultation:    Patient ID: Katherine Yu MRN: [id number]; DOB: Aug 05, 1951   Admit date: 04/19/2021 Date of Consult: 04/19/2021   PCP:  Leonard Downing, MD              [place] [facility] Cardiologist:  Minus Breeding, MD   {     Patient Profile:    Katherine Yu is a 70 y.o. female with a hx of chest pain w/ negative POET, TMJ diseaSe, MS, obesity, HTN, HLD, seizures who is being seen 04/19/2021 for the evaluation of complete heart block at the request of Dr Percival Spanish.   History of Present Illness:    [name] presented to [facility] clinic 04/16/2021 with several days of dyspnea with exertion/fatigue. Priori to this she was quite active without these symptoms. No syncope or presyncope. Heart rates have been in the 40s.  She is very active at home priro tot his happening. Since the onset of her symptoms she has checked her BP regularly. During checks her machine reported consistent Hrs in the 86s. She confirms no syncope/presyncope again to me today. No recent illnesses.          Past Medical History:  Diagnosis Date   ARTHRITIS 02/20/2009    Qualifier: Diagnosis of  By: Hester Mates, Carol     CHEST PAIN-PRECORDIAL 03/02/2009    Qualifier: Diagnosis of  By: Verl Blalock, MD, Estevan Oaks C    Chronic insomnia     Degenerative arthritis     DEPRESSION 02/20/2009    Qualifier: Diagnosis of  By: Orville Govern, CMA, Carol     Dyslipidemia     FATIGUE 02/20/2009    Qualifier: Diagnosis of  By: Orville Govern CMA, Carol     GERD 02/20/2009    Qualifier: Diagnosis of  By: Orville Govern CMA, Carol     History of lower leg fracture     History of peptic ulcer     History of seizures      In childhood   HYPERLIPIDEMIA-MIXED 05/04/2010    Qualifier: Diagnosis of  By: Verl Blalock, MD, Estevan Oaks C    Hypertension     Migraine     Mild obesity     MULTIPLE SCLEROSIS 02/20/2009    Qualifier: Diagnosis of  By: Orville Govern, CMA, Carol     Plantar fasciitis     Temporomandibular  joint disease     Urinary incontinence             Past Surgical History:  Procedure Laterality Date   APPENDECTOMY       GALLBLADDER SURGERY       metal plate resection        skull   pilonidal cyst              Inpatient Medications: Scheduled Meds:  chlorhexidine  4 application Topical Once   gentamicin irrigation  80 mg Irrigation On Call   povidone-iodine  2 application Topical Once    Continuous Infusions:  sodium chloride 50 mL/hr at 04/19/21 1040    ceFAZolin (ANCEF) IV 2 g (04/19/21 1459)    PRN Meds:     Allergies:        Allergies  Allergen Reactions   Niacin Anaphylaxis, Hives, Itching and Rash      Social History:   Social History         Socioeconomic History   Marital status: Married      Spouse name: Mikeal Hawthorne   Number of children: 3  Years of education: Masters   Highest education level: Not on file  Occupational History   Occupation: registered nurse  Tobacco Use   Smoking status: Never   Smokeless tobacco: Never  Substance and Sexual Activity   Alcohol use: No   Drug use: No   Sexual activity: Not on file  Other Topics Concern   Not on file  Social History Narrative    Patient is right handed.    Patient drinks 1 cup caffeine daily.    Lives with husband and son.     Social Determinants of Health    Financial Resource Strain: Not on file  Food Insecurity: Not on file  Transportation Needs: Not on file  Physical Activity: Not on file  Stress: Not on file  Social Connections: Not on file  Intimate Partner Violence: Not on file    Family History:          Family History  Adopted: Yes  Problem Relation Age of Onset   Lupus Sister     Cancer Sister     Heart attack Sister     Heart attack Sister     Breast cancer Neg Hx        ROS:  Please see the history of present illness.    All other ROS reviewed and negative.      Physical Exam/Data:        Vitals:    04/19/21 1013 04/19/21 1500  BP: 129/65    Pulse: (!)  40    Resp: 18    Temp: 98.1 F (36.7 C)    TempSrc: Oral    SpO2: 100% 99%  Weight: 69.9 kg    Height: 5' 6.5" (1.689 m)      No intake or output data in the 24 hours ending 04/19/21 1505 Last 3 Weights 04/19/2021 04/16/2021 03/11/2021  Weight (lbs) 154 lb 155 lb 160 lb  Weight (kg) 69.854 kg 70.308 kg 72.576 kg     Body mass index is 24.48 kg/m.    General:  Well nourished, well developed, in no acute distress HEENT: normal Lymph: no adenopathy Neck: no JVD Endocrine:  No thryomegaly Vascular: No carotid bruits; FA pulses 2+ bilaterally without bruits  Cardiac:  bradycardic, regularly rhythm; no murmur Lungs:  clear to auscultation bilaterally, no wheezing, rhonchi or rales  Abd: soft, nontender, no hepatomegaly  Ext: no edema Musculoskeletal:  No deformities, BUE and BLE strength normal and equal Skin: warm and dry  Neuro:  CNs 2-12 intact, no focal abnormalities noted Psych:  Normal affect    EKG:  The EKG was personally reviewed and demonstrates:  sinus rhythm with complete AV block     Relevant CV Studies:   02/10/2020 ECG shows first degree AV delay and narrow qrs 01/22/2020 ecg shows borderline first degree av delay   04/19/2021 bedside transthoracic echocardiogram performed by myself shows normal LV function and a normal LV size   Laboratory Data:   High Sensitivity Troponin:   Last Labs   No results for input(s): TROPONINIHS in the last 720 hours.     Chemistry Last Labs      Recent Labs  Lab 04/19/21 1130  NA 141  K 3.6  CL 104  CO2 28  GLUCOSE 111*  BUN 25*  CREATININE 1.05*  CALCIUM 9.6  GFRNONAA 58*  ANIONGAP 9      Last Labs   No results for input(s): PROT, ALBUMIN, AST, ALT, ALKPHOS, BILITOT in the last 168 hours.  Hematology Last Labs      Recent Labs  Lab 04/19/21 1130  WBC 3.9*  RBC 5.28*  HGB 15.9*  HCT 47.9*  MCV 90.7  MCH 30.1  MCHC 33.2  RDW 13.3  PLT 192      BNP Last Labs   No results for input(s): BNP, PROBNP  in the last 168 hours.    DDimer  Last Labs   No results for input(s): DDIMER in the last 168 hours.       Radiology/Studies:  No results found.     Assessment and Plan:    Complete heart block Symptomatic with narrow escape. ECG shows first degree AV delay. I discussed treatment options with the patient. I think she will benefit from a permanent pacemaker. Will plan for a left bundle area pacemaker system. Procedure discussed in detail with the patient and she wishes to proceed.   Risks, benefits, alternatives to PPM implantation were discussed in detail with the patient today. The patient understands that the risks include but are not limited to bleeding, infection, pneumothorax, perforation, tamponade, vascular damage, renal failure, MI, stroke, death, and lead dislodgement and wishes to proceed.    Will also check lyme titers today.     For questions or updates, please contact [place] Please consult [email or web address] for contact info under      Signed, Vickie Epley, MD  04/19/2021 3:05 PM

## 2021-04-19 NOTE — Consult Note (Signed)
Electrophysiology Consultation:   Patient ID: Katherine Yu MRN: [id number]; DOB: August 23, 1951  Admit date: 04/19/2021 Date of Consult: 04/19/2021  PCP:  Katherine Downing, MD   [place] [facility] Cardiologist:  Katherine Breeding, MD   {   Patient Profile:   Katherine Yu is a 70 y.o. female with a hx of chest pain w/ negative POET, TMJ diseaSe, MS, obesity, HTN, HLD, seizures who is being seen 04/19/2021 for the evaluation of complete heart block at the request of Katherine Katherine Yu.  History of Present Illness:   Katherine Yu presented to Katherine [facility] 04/16/2021 with several days of dyspnea with exertion/fatigue. Priori to this she was quite active without these symptoms. No syncope or presyncope. Heart rates have been in the 40s.  She is very active at home priro tot his happening. Since the onset of her symptoms she has checked her BP regularly. During checks her machine reported consistent Hrs in the 31s. She confirms no syncope/presyncope again to me today. No recent illnesses.    Past Medical History:  Diagnosis Date   ARTHRITIS 02/20/2009   Qualifier: Diagnosis of  By: Katherine Yu, Carol     CHEST PAIN-PRECORDIAL 03/02/2009   Qualifier: Diagnosis of  By: Verl Blalock, MD, Estevan Oaks C    Chronic insomnia    Degenerative arthritis    DEPRESSION 02/20/2009   Qualifier: Diagnosis of  By: Orville Govern, CMA, Carol     Dyslipidemia    FATIGUE 02/20/2009   Qualifier: Diagnosis of  By: Orville Govern CMA, Carol     GERD 02/20/2009   Qualifier: Diagnosis of  By: Orville Govern CMA, Carol     History of lower leg fracture    History of peptic ulcer    History of seizures    In childhood   HYPERLIPIDEMIA-MIXED 05/04/2010   Qualifier: Diagnosis of  By: Verl Blalock, MD, Estevan Oaks C    Hypertension    Migraine    Mild obesity    MULTIPLE SCLEROSIS 02/20/2009   Qualifier: Diagnosis of  By: Orville Govern, CMA, Carol     Plantar fasciitis    Temporomandibular joint disease    Urinary incontinence      Past Surgical History:  Procedure Laterality Date   APPENDECTOMY     GALLBLADDER SURGERY     metal plate resection     skull   pilonidal cyst         Inpatient Medications: Scheduled Meds:  chlorhexidine  4 application Topical Once   gentamicin irrigation  80 mg Irrigation On Call   povidone-iodine  2 application Topical Once   Continuous Infusions:  sodium chloride 50 mL/hr at 04/19/21 1040    ceFAZolin (ANCEF) IV 2 g (04/19/21 1459)   PRN Meds:   Allergies:    Allergies  Allergen Reactions   Niacin Anaphylaxis, Hives, Itching and Rash    Social History:   Social History   Socioeconomic History   Marital status: Married    Spouse name: Katherine Yu   Number of children: 3   Years of education: Masters   Highest education level: Not on file  Occupational History   Occupation: Equities trader  Tobacco Use   Smoking status: Never   Smokeless tobacco: Never  Substance and Sexual Activity   Alcohol use: No   Drug use: No   Sexual activity: Not on file  Other Topics Concern   Not on file  Social History Narrative   Patient is right handed.   Patient drinks 1 cup  caffeine daily.   Lives with husband and son.    Social Determinants of Health   Financial Resource Strain: Not on file  Food Insecurity: Not on file  Transportation Needs: Not on file  Physical Activity: Not on file  Stress: Not on file  Social Connections: Not on file  Intimate Partner Violence: Not on file    Family History:    Family History  Adopted: Yes  Problem Relation Age of Onset   Lupus Sister    Cancer Sister    Heart attack Sister    Heart attack Sister    Breast cancer Neg Hx      ROS:  Please see the history of present illness.   All other ROS reviewed and negative.     Physical Exam/Data:   Vitals:   04/19/21 1013 04/19/21 1500  BP: 129/65   Pulse: (!) 40   Resp: 18   Temp: 98.1 F (36.7 C)   TempSrc: Oral   SpO2: 100% 99%  Weight: 69.9 kg   Height: 5'  6.5" (1.689 m)    No intake or output data in the 24 hours ending 04/19/21 1505 Last 3 Weights 04/19/2021 04/16/2021 03/11/2021  Weight (lbs) 154 lb 155 lb 160 lb  Weight (kg) 69.854 kg 70.308 kg 72.576 kg     Body mass index is 24.48 kg/m.   General:  Well nourished, well developed, in no acute distress HEENT: normal Lymph: no adenopathy Neck: no JVD Endocrine:  No thryomegaly Vascular: No carotid bruits; FA pulses 2+ bilaterally without bruits  Cardiac:  bradycardic, regularly rhythm; no murmur Lungs:  clear to auscultation bilaterally, no wheezing, rhonchi or rales  Abd: soft, nontender, no hepatomegaly  Ext: no edema Musculoskeletal:  No deformities, BUE and BLE strength normal and equal Skin: warm and dry  Neuro:  CNs 2-12 intact, no focal abnormalities noted Psych:  Normal affect   EKG:  The EKG was personally reviewed and demonstrates:  sinus rhythm with complete AV block   Relevant CV Studies:  02/10/2020 ECG shows first degree AV delay and narrow qrs 01/22/2020 ecg shows borderline first degree av delay  04/19/2021 bedside transthoracic echocardiogram performed by myself shows normal LV function and a normal LV size  Laboratory Data:  High Sensitivity Troponin:  No results for input(s): TROPONINIHS in the last 720 hours.   Chemistry Recent Labs  Lab 04/19/21 1130  NA 141  K 3.6  CL 104  CO2 28  GLUCOSE 111*  BUN 25*  CREATININE 1.05*  CALCIUM 9.6  GFRNONAA 58*  ANIONGAP 9    No results for input(s): PROT, ALBUMIN, AST, ALT, ALKPHOS, BILITOT in the last 168 hours. Hematology Recent Labs  Lab 04/19/21 1130  WBC 3.9*  RBC 5.28*  HGB 15.9*  HCT 47.9*  MCV 90.7  MCH 30.1  MCHC 33.2  RDW 13.3  PLT 192   BNPNo results for input(s): BNP, PROBNP in the last 168 hours.  DDimer No results for input(s): DDIMER in the last 168 hours.   Radiology/Studies:  No results found.   Assessment and Plan:   Complete heart block Symptomatic with narrow escape.  ECG shows first degree AV delay. I discussed treatment options with the patient. I think she will benefit from a permanent pacemaker. Will plan for a left bundle area pacemaker system. Procedure discussed in detail with the patient and she wishes to proceed.  Risks, benefits, alternatives to PPM implantation were discussed in detail with the patient today. The patient  understands that the risks include but are not limited to bleeding, infection, pneumothorax, perforation, tamponade, vascular damage, renal failure, MI, stroke, death, and lead dislodgement and wishes to proceed.   Will also check lyme titers today.   For questions or updates, please contact [place] Please consult [email or web address] for contact info under    Signed, Vickie Epley, MD  04/19/2021 3:05 PM

## 2021-04-20 ENCOUNTER — Encounter (HOSPITAL_COMMUNITY): Payer: Self-pay | Admitting: Cardiology

## 2021-04-20 ENCOUNTER — Ambulatory Visit (HOSPITAL_COMMUNITY): Payer: 59

## 2021-04-20 ENCOUNTER — Other Ambulatory Visit (HOSPITAL_COMMUNITY): Payer: Self-pay

## 2021-04-20 DIAGNOSIS — I442 Atrioventricular block, complete: Secondary | ICD-10-CM | POA: Diagnosis not present

## 2021-04-20 DIAGNOSIS — R569 Unspecified convulsions: Secondary | ICD-10-CM | POA: Diagnosis not present

## 2021-04-20 DIAGNOSIS — I10 Essential (primary) hypertension: Secondary | ICD-10-CM | POA: Diagnosis not present

## 2021-04-20 DIAGNOSIS — Z6825 Body mass index (BMI) 25.0-25.9, adult: Secondary | ICD-10-CM | POA: Diagnosis not present

## 2021-04-20 DIAGNOSIS — Z888 Allergy status to other drugs, medicaments and biological substances status: Secondary | ICD-10-CM | POA: Diagnosis not present

## 2021-04-20 DIAGNOSIS — Z950 Presence of cardiac pacemaker: Secondary | ICD-10-CM | POA: Diagnosis not present

## 2021-04-20 DIAGNOSIS — G35 Multiple sclerosis: Secondary | ICD-10-CM | POA: Diagnosis not present

## 2021-04-20 DIAGNOSIS — E785 Hyperlipidemia, unspecified: Secondary | ICD-10-CM | POA: Diagnosis not present

## 2021-04-20 DIAGNOSIS — E669 Obesity, unspecified: Secondary | ICD-10-CM | POA: Diagnosis not present

## 2021-04-20 LAB — URINALYSIS, COMPLETE (UACMP) WITH MICROSCOPIC
Bacteria, UA: NONE SEEN
Bilirubin Urine: NEGATIVE
Glucose, UA: NEGATIVE mg/dL
Ketones, ur: NEGATIVE mg/dL
Nitrite: NEGATIVE
Protein, ur: NEGATIVE mg/dL
Specific Gravity, Urine: 1.018 (ref 1.005–1.030)
pH: 5 (ref 5.0–8.0)

## 2021-04-20 MED ORDER — SULFAMETHOXAZOLE-TRIMETHOPRIM 800-160 MG PO TABS
1.0000 | ORAL_TABLET | Freq: Two times a day (BID) | ORAL | 0 refills | Status: DC
  Filled 2021-04-20: qty 6, 3d supply, fill #0

## 2021-04-20 MED FILL — Lidocaine HCl Local Inj 1%: INTRAMUSCULAR | Qty: 45 | Status: AC

## 2021-04-20 NOTE — Consult Note (Signed)
   [facility] Inpatient Consult   04/20/2021  Kennedy 1950-10-18 [id number]   [place] [facility] [ACO] Patient: [name] plan  Patient is currently active with [facility] for chronic disease management services.  Patient to be assigned to [place] Coordinator for the [place].   Patient had already transitioned home, notifed [facility] CM team of disposition.   Plan: Patient will be followed by [place] Coordinator.   For additional questions or referrals please contact:   Natividad Brood, RN BSN [facility] Liaison  [phone] business mobile phone Toll free office [phone]  Fax number: [phone] [email or web address]

## 2021-04-20 NOTE — Progress Notes (Signed)
Patient has been having lower back pain and says her urine is starting to smell. Patient, based on these 2 symptoms, thinks she may be getting a UTI. Please discuss as necessary.

## 2021-04-20 NOTE — Plan of Care (Signed)
  Problem: Education: Goal: Knowledge of General Education information will improve Description: Including pain rating scale, medication(s)/side effects and non-pharmacologic comfort measures Outcome: Adequate for Discharge   

## 2021-04-20 NOTE — Progress Notes (Signed)
Discharge instructions (including medications) discussed with and copy provided to patient/caregiver 

## 2021-04-21 ENCOUNTER — Other Ambulatory Visit: Payer: Self-pay | Admitting: *Deleted

## 2021-04-21 ENCOUNTER — Encounter: Payer: Self-pay | Admitting: *Deleted

## 2021-04-21 NOTE — Patient Outreach (Signed)
[facility] [facility]) Care Management  04/21/2021  Katherine Yu May 13, 1951 [id number]   Transition of care call/case closure   Referral received:04/20/21 Initial outreach:04/21/21 Insurance: [place] [employer]    Subjective: Initial successful telephone call to patient's preferred number in order to complete transition of care assessment; 2 HIPAA identifiers verified. Explained purpose of call and completed transition of care assessment.  Katherine Yu states that she is doing okay denies chest pain or shortness of breath . She denies post-operative problems, says surgical incisions are unremarkable,pacemaker site looks the same as when discharge, no increase in swelling or bruising, redness. She reports pain is controlled with prn tylenol. She is   tolerating diet, reinforced drinking plenty fluids . She discussed UTI and continued urinary frequency taking antibiotic as prescribed. She  denies bowel or bladder problems.  Spouse is assisting in her recovery.   Reviewed accessing the following [place] Benefits : She discussed chronic conditions of hypertension and diabetes under control . She discussed attending diabetes nutrition classes and is participating in a support group. She denies need for referral to one of [place] chronic condition programs.  She uses a [facility] outpatient pharmacy at [facility].     Objective:  Katherine Yu was hospitalized at [facility] 8/15-8/15/22  for Pacemaker insertion, complete heart block. Comorbidities include: hypertension, hyperlipidemia, Multple sclerosis.  She was discharged to home on 04/20/21 without the need for home health services or DME.   Assessment:  Patient voices good understanding of all discharge instructions.  See transition of care flowsheet for assessment details.   Plan:  Reviewed hospital discharge diagnosis of Pacemaker placement  and discharge treatment plan using hospital discharge  instructions, assessing medication adherence, reviewing problems requiring provider notification, and discussing the importance of follow up with surgeon, primary care provider and/or specialists as directed. Stressed importance of adherence to limitation in arm movement progress and restrictions .  Reviewed [place] healthy lifestyle program information to receive discounted premium for  2023   Step 1: Get  your annual physical  Step 2: Complete your health assessment  Step 3:Identify your current health status and complete the corresponding action step between September 05, 2020 and May 06, 2021.    Marland Kitchen    No ongoing care management needs identified so will close case to [place] [facility] services and route successful outreach letter with [facility] pamphlet and 24 Hour Nurse Line Magnet to [place] [facility] clinical pool to be mailed to patient's home address.   Joylene Draft, RN, BSN  [place] [facility] Coordinator  [phone]- Mobile [phone]- Toll Free Main Office

## 2021-04-22 DIAGNOSIS — R350 Frequency of micturition: Secondary | ICD-10-CM | POA: Diagnosis not present

## 2021-04-23 LAB — CBC
Hematocrit: 50.2 % — ABNORMAL HIGH (ref 34.0–46.6)
Hemoglobin: 16 g/dL — ABNORMAL HIGH (ref 11.1–15.9)
MCH: 30.2 pg (ref 26.6–33.0)
MCHC: 31.9 g/dL (ref 31.5–35.7)
MCV: 95 fL (ref 79–97)
Platelets: 195 10*3/uL (ref 150–450)
RBC: 5.3 x10E6/uL — ABNORMAL HIGH (ref 3.77–5.28)
RDW: 13.2 % (ref 11.7–15.4)
WBC: 5.4 10*3/uL (ref 3.4–10.8)

## 2021-04-23 LAB — BASIC METABOLIC PANEL
BUN/Creatinine Ratio: 18 (ref 12–28)
BUN: 26 mg/dL (ref 8–27)
CO2: 21 mmol/L (ref 20–29)
Calcium: 9.8 mg/dL (ref 8.7–10.3)
Chloride: 98 mmol/L (ref 96–106)
Creatinine, Ser: 1.43 mg/dL — ABNORMAL HIGH (ref 0.57–1.00)
Glucose: 108 mg/dL — ABNORMAL HIGH (ref 65–99)
Potassium: 4.6 mmol/L (ref 3.5–5.2)
Sodium: 142 mmol/L (ref 134–144)
eGFR: 40 mL/min/{1.73_m2} — ABNORMAL LOW (ref >59)

## 2021-04-23 LAB — TSH: TSH: 2.32 u[IU]/mL (ref 0.450–4.500)

## 2021-04-27 ENCOUNTER — Other Ambulatory Visit (HOSPITAL_COMMUNITY): Payer: Self-pay

## 2021-04-28 ENCOUNTER — Other Ambulatory Visit (HOSPITAL_COMMUNITY): Payer: Self-pay

## 2021-04-28 MED ORDER — HYDROCHLOROTHIAZIDE 50 MG PO TABS
ORAL_TABLET | ORAL | 0 refills | Status: DC
  Filled 2021-04-28: qty 45, 90d supply, fill #0

## 2021-04-29 ENCOUNTER — Other Ambulatory Visit (HOSPITAL_COMMUNITY): Payer: Self-pay

## 2021-04-29 ENCOUNTER — Other Ambulatory Visit: Payer: Self-pay

## 2021-04-29 ENCOUNTER — Ambulatory Visit (INDEPENDENT_AMBULATORY_CARE_PROVIDER_SITE_OTHER): Payer: 59

## 2021-04-29 DIAGNOSIS — I442 Atrioventricular block, complete: Secondary | ICD-10-CM | POA: Diagnosis not present

## 2021-04-29 LAB — CUP PACEART INCLINIC DEVICE CHECK
Battery Remaining Longevity: 139 mo
Battery Voltage: 3.21 V
Brady Statistic AP VP Percent: 0.09 %
Brady Statistic AP VS Percent: 0 %
Brady Statistic AS VP Percent: 99.91 %
Brady Statistic AS VS Percent: 0 %
Brady Statistic RA Percent Paced: 0.08 %
Brady Statistic RV Percent Paced: 100 %
Date Time Interrogation Session: 20220825124033
Implantable Lead Implant Date: 20220815
Implantable Lead Implant Date: 20220815
Implantable Lead Location: 753859
Implantable Lead Location: 753860
Implantable Lead Model: 3830
Implantable Lead Model: 5076
Implantable Pulse Generator Implant Date: 20220815
Lead Channel Impedance Value: 361 Ohm
Lead Channel Impedance Value: 380 Ohm
Lead Channel Impedance Value: 475 Ohm
Lead Channel Impedance Value: 627 Ohm
Lead Channel Pacing Threshold Amplitude: 0.5 V
Lead Channel Pacing Threshold Amplitude: 1 V
Lead Channel Pacing Threshold Pulse Width: 0.4 ms
Lead Channel Pacing Threshold Pulse Width: 0.4 ms
Lead Channel Sensing Intrinsic Amplitude: 3.25 mV
Lead Channel Setting Pacing Amplitude: 3.5 V
Lead Channel Setting Pacing Amplitude: 3.5 V
Lead Channel Setting Pacing Pulse Width: 0.4 ms
Lead Channel Setting Sensing Sensitivity: 0.9 mV

## 2021-04-29 MED ORDER — ONDANSETRON 8 MG PO TBDP
8.0000 mg | ORAL_TABLET | Freq: Three times a day (TID) | ORAL | 12 refills | Status: AC | PRN
  Filled 2021-04-29: qty 60, 20d supply, fill #0

## 2021-04-29 NOTE — Progress Notes (Signed)
Wound check appointment. Steri-strips removed. Wound without redness or edema. Incision edges approximated, wound well healed. Normal device function. Thresholds, sensing, and impedances consistent with implant measurements. Device programmed at 3.5V/auto capture programmed on for extra safety margin until 3 month visit. Histogram distribution appropriate for patient and level of activity. No mode switches or high ventricular rates noted. Patient educated about wound care, arm mobility, lifting restrictions. Patient enrolled in remote monitoring with next transmission scheduled 07/26/21. 91 day follow up with Dr. Quentin Ore 07/28/21.

## 2021-04-29 NOTE — Patient Instructions (Addendum)
   After Your Pacemaker   Monitor your pacemaker site for redness, swelling, and drainage. Call the device clinic at [phone] if you experience these symptoms or fever/chills.  Your incision was closed with Steri-strips or staples:  You may shower 7 days after your procedure and wash your incision with soap and water. Avoid lotions, ointments, or perfumes over your incision until it is well-healed.  You may use a hot tub or a pool after your wound check appointment if the incision is completely closed.  Do not lift, push or pull greater than 10 pounds with the affected arm until 6 weeks after your procedure. There are no other restrictions in arm movement after your wound check appointment. September 26th  You may drive, unless driving has been restricted by your healthcare providers.  Your Pacemaker is MRI compatible.  Remote monitoring is used to monitor your pacemaker from home. This monitoring is scheduled every 91 days by our office. It allows [place] to keep an eye on the functioning of your device to ensure it is working properly. You will routinely see your Electrophysiologist annually (more often if necessary).

## 2021-05-03 ENCOUNTER — Other Ambulatory Visit (HOSPITAL_COMMUNITY): Payer: Self-pay

## 2021-05-05 ENCOUNTER — Other Ambulatory Visit (HOSPITAL_COMMUNITY): Payer: Self-pay

## 2021-05-06 ENCOUNTER — Encounter: Payer: Self-pay | Admitting: Dietician

## 2021-05-06 ENCOUNTER — Other Ambulatory Visit: Payer: Self-pay

## 2021-05-06 ENCOUNTER — Encounter: Payer: 59 | Attending: Family Medicine | Admitting: Dietician

## 2021-05-06 DIAGNOSIS — E118 Type 2 diabetes mellitus with unspecified complications: Secondary | ICD-10-CM | POA: Diagnosis not present

## 2021-05-06 NOTE — Patient Instructions (Signed)
Continue a heart healthy diabetic plan.   Continue to stay active.  Please call for any questions.

## 2021-05-06 NOTE — Progress Notes (Signed)
Diabetes Self-Management Education  Visit Type: Follow-up  Appt. Start Time: 1615 Appt. End Time: 1645  05/06/2021  [name], identified by name and date of birth, is a 70 y.o. female with a diagnosis of Diabetes:  .   ASSESSMENT Patient is here today alone.  She recently had a pacemaker and states that she should be on a low sodium heart healthy diet.  She would like guidance on that. She has stopped using added salt.  She continues to read labels for carbohydrates, sugar, and sodium. She is using some luncheon meat (ham and [place]) for sandwiches.  She is not checking her blood sugar as her A1C was 5.8% 01/26/2021. Weight has increased from 154 lbs 02/11/2021 to 163 today (however she had her shoes, phone, etc).  She also feels that this will change when she is more active.  She has resumed walking most days for about 30 minutes several times per day with her dog.  Patient lives with her husband and son.  Due to her pacemaker procedure, patient has not been cooking for them and just makes easy things for herself.   Diabetes Self-Management Education - 05/06/21 1902       Visit Information   Visit Type Follow-up      Psychosocial Assessment   Patient Belief/Attitude about Diabetes Motivated to manage diabetes    Self-care barriers None    Self-management support Doctor's office;Family    Other persons present Patient    Learning Readiness Ready      Pre-Education Assessment   Patient understands the diabetes disease and treatment process. Demonstrates understanding / competency    Patient understands incorporating nutritional management into lifestyle. Needs Review    Patient undertands incorporating physical activity into lifestyle. Demonstrates understanding / competency    Patient understands using medications safely. Demonstrates understanding / competency    Patient understands monitoring blood glucose, interpreting and using results Demonstrates understanding /  competency    Patient understands prevention, detection, and treatment of acute complications. Demonstrates understanding / competency    Patient understands prevention, detection, and treatment of chronic complications. Demonstrates understanding / competency    Patient understands how to develop strategies to address psychosocial issues. Demonstrates understanding / competency    Patient understands how to develop strategies to promote health/change behavior. Demonstrates understanding / competency      Complications   How often do you check your blood sugar? Not recommended by provider      Dietary Intake   Breakfast Grainberry cereal, berries or banana, skim milk    Lunch salmon or chicken and vegetables, fresh fruit    Dinner similar to lunch    Beverage(s) water, herbal tea      Exercise   Exercise Type Light (walking / raking leaves)    How many days per week to you exercise? 7    How many minutes per day do you exercise? 60    Total minutes per week of exercise 420      Patient Education   Previous Diabetes Education Yes (please comment)   02/2021   Nutrition management  Other (comment)   low sodium heart healthy     Individualized Goals (developed by patient)   Nutrition General guidelines for healthy choices and portions discussed    Physical Activity Exercise 5-7 days per week;45 minutes per day    Medications Not Applicable    Monitoring  Not Applicable    Reducing Risk increase portions of healthy fats;Other (comment)  low sodium     Patient Self-Evaluation of Goals - Patient rates self as meeting previously set goals (% of time)   Nutrition >75%    Physical Activity >75%    Medications Not Applicable    Monitoring Not Applicable    Problem Solving >75%    Reducing Risk >75%    Health Coping >75%      Post-Education Assessment   Patient understands the diabetes disease and treatment process. Demonstrates understanding / competency    Patient understands  incorporating nutritional management into lifestyle. Demonstrates understanding / competency    Patient undertands incorporating physical activity into lifestyle. Demonstrates understanding / competency    Patient understands using medications safely. Demonstrates understanding / competency    Patient understands monitoring blood glucose, interpreting and using results Demonstrates understanding / competency    Patient understands prevention, detection, and treatment of acute complications. Demonstrates understanding / competency    Patient understands prevention, detection, and treatment of chronic complications. Demonstrates understanding / competency    Patient understands how to develop strategies to address psychosocial issues. Demonstrates understanding / competency    Patient understands how to develop strategies to promote health/change behavior. Demonstrates understanding / competency      Outcomes   Expected Outcomes Demonstrated interest in learning. Expect positive outcomes    Future DMSE PRN    Program Status Completed      Subsequent Visit   Since your last visit have you experienced any weight changes? Gain    Weight Gain (lbs) 9    Since your last visit, are you checking your blood glucose at least once a day? N/A             Individualized Plan for Diabetes Self-Management Training:   Learning Objective:  Patient will have a greater understanding of diabetes self-management. Patient education plan is to attend individual and/or group sessions per assessed needs and concerns.   Plan:   Patient Instructions  Continue a heart healthy diabetic plan.   Continue to stay active.  Please call for any questions.  Expected Outcomes:  Demonstrated interest in learning. Expect positive outcomes  Education material provided: Heart Healthy Consistant Carbohydrate Nutrition Therapy from AND  If problems or questions, patient to contact team via:  Phone  Future DSME  appointment: PRN

## 2021-05-11 ENCOUNTER — Telehealth: Payer: Self-pay | Admitting: Dietician

## 2021-05-11 NOTE — Telephone Encounter (Signed)
Patient emailed me her recent weight history.  9/2 157.2 9/1 157.8 8/30 158 8/29 156.4 8/28 158.8 8/27 155.6 8/26 155.2 8/25 152.8 8/24 151.6 8/23 152.8 8/22 154.6  154 lbs in office 02/11/2021.  Emailed patient to monitor sodium and changed luncheon meat to another option.  Antonieta Iba, RD, LDN, CDCES

## 2021-05-17 ENCOUNTER — Ambulatory Visit: Payer: 59 | Admitting: Neurology

## 2021-05-22 ENCOUNTER — Other Ambulatory Visit (HOSPITAL_COMMUNITY): Payer: Self-pay

## 2021-05-24 ENCOUNTER — Other Ambulatory Visit (HOSPITAL_COMMUNITY): Payer: Self-pay

## 2021-05-24 MED ORDER — ROSUVASTATIN CALCIUM 5 MG PO TABS
ORAL_TABLET | ORAL | 3 refills | Status: DC
  Filled 2021-05-24: qty 90, 90d supply, fill #0
  Filled 2021-08-24: qty 90, 90d supply, fill #1

## 2021-05-25 ENCOUNTER — Other Ambulatory Visit (HOSPITAL_COMMUNITY): Payer: Self-pay

## 2021-05-27 DIAGNOSIS — Z01419 Encounter for gynecological examination (general) (routine) without abnormal findings: Secondary | ICD-10-CM | POA: Diagnosis not present

## 2021-05-27 DIAGNOSIS — N816 Rectocele: Secondary | ICD-10-CM | POA: Diagnosis not present

## 2021-05-27 DIAGNOSIS — N814 Uterovaginal prolapse, unspecified: Secondary | ICD-10-CM | POA: Diagnosis not present

## 2021-05-27 DIAGNOSIS — N8111 Cystocele, midline: Secondary | ICD-10-CM | POA: Diagnosis not present

## 2021-05-31 ENCOUNTER — Telehealth: Payer: Self-pay

## 2021-05-31 ENCOUNTER — Other Ambulatory Visit (HOSPITAL_COMMUNITY): Payer: Self-pay

## 2021-05-31 NOTE — Telephone Encounter (Signed)
Attempted to contact patient to advise I will forward to Dr. Quentin Ore for review.   No answer, LMTCB.

## 2021-05-31 NOTE — Telephone Encounter (Signed)
Returning phone call.   Patient reports the incision appears red area the edges and a dark spot at the end of the incision line.  Requested patient send photo via email. Denies any drainage, fever, chills or swelling.   Discussed lifting restrictions with patient with verbal understanding. Today if 6 week mark.   Advised patient once picture is viewed, will call her back.

## 2021-05-31 NOTE — Telephone Encounter (Signed)
Pt left a message stating today is her  week mark when she can lift more than 10 lbs and can sleep on her left side. She has a spot that is not completely healed. It is dark and a little red around it. Her question is should she wait a little longer and let it completely healed before she start lifting more than 10 lbs. She would like a call back at [phone].

## 2021-06-03 NOTE — Telephone Encounter (Signed)
Device Clinic apt made 06/04/21 @ 10:00

## 2021-06-03 NOTE — Telephone Encounter (Signed)
New image received and patient does not think the redness is any worse.

## 2021-06-04 ENCOUNTER — Other Ambulatory Visit: Payer: Self-pay

## 2021-06-04 ENCOUNTER — Other Ambulatory Visit (HOSPITAL_COMMUNITY): Payer: Self-pay

## 2021-06-04 ENCOUNTER — Ambulatory Visit (INDEPENDENT_AMBULATORY_CARE_PROVIDER_SITE_OTHER): Payer: 59

## 2021-06-04 DIAGNOSIS — I442 Atrioventricular block, complete: Secondary | ICD-10-CM

## 2021-06-04 MED ORDER — CEPHALEXIN 500 MG PO CAPS
500.0000 mg | ORAL_CAPSULE | Freq: Two times a day (BID) | ORAL | 0 refills | Status: AC
  Filled 2021-06-04: qty 14, 7d supply, fill #0

## 2021-06-04 NOTE — Progress Notes (Signed)
Wound check appointment for patient c/o non-healing implant site. Wound D/I with no drainage noted. AChalmers Cater, PA in to assess. Unable to locate stitch. Per verbal order, Keflex 500mg  BID x 7 days sent to patient's confirmed pharmacy of choice. Patient instructed to call device clinic with any increased redness, warmth, pain, drainage, or other signs of potential infection. She is provided device clinic contact [phone].

## 2021-06-04 NOTE — Patient Instructions (Signed)
Please take antibiotic as prescribed and call device clinic at [phone] with any additional questions or concerns including increased redness or warmth at site, swelling, drainage, or if you become febrile.

## 2021-06-08 ENCOUNTER — Encounter: Payer: Self-pay | Admitting: Podiatry

## 2021-06-08 ENCOUNTER — Other Ambulatory Visit: Payer: Self-pay

## 2021-06-08 ENCOUNTER — Other Ambulatory Visit: Payer: Self-pay | Admitting: Obstetrics and Gynecology

## 2021-06-08 ENCOUNTER — Other Ambulatory Visit (HOSPITAL_COMMUNITY): Payer: Self-pay

## 2021-06-08 ENCOUNTER — Ambulatory Visit: Payer: 59 | Admitting: Podiatry

## 2021-06-08 DIAGNOSIS — G5731 Lesion of lateral popliteal nerve, right lower limb: Secondary | ICD-10-CM | POA: Diagnosis not present

## 2021-06-08 DIAGNOSIS — M722 Plantar fascial fibromatosis: Secondary | ICD-10-CM | POA: Diagnosis not present

## 2021-06-08 DIAGNOSIS — E118 Type 2 diabetes mellitus with unspecified complications: Secondary | ICD-10-CM | POA: Diagnosis not present

## 2021-06-08 DIAGNOSIS — E119 Type 2 diabetes mellitus without complications: Secondary | ICD-10-CM

## 2021-06-08 DIAGNOSIS — Z1231 Encounter for screening mammogram for malignant neoplasm of breast: Secondary | ICD-10-CM

## 2021-06-08 NOTE — Progress Notes (Signed)
ANNUAL DIABETIC FOOT EXAM  Subjective: Katherine Yu presents today for for annual diabetic foot examination and at risk foot care with h/o multiple sclerosis.  Patient relates greater than one year h/o diabetes.  Patient denies any h/o foot wounds.  Patient denies symptoms of foot numbness.  Patient does have symptoms of foot tingling.  Patient does have symptoms of burning in feet  Patient does not monitor blood glucose daily. She controls diabetes via diet.  Leonard Downing, MD is patient's PCP. Last visit was 04/26/2021.  She has been seen for plantar fasciitis of the right foot. This has waxed and waned over the past year. She has Power Step [employer], [employer], [employer] and plantar fascial brace. She cannot tolerate the plantar fascial brace as it causes her pain on the lateral aspect of her foot. She has had PT and completed it in July which consisted of theraband excercises, tennis ball excercises and Achilles stretches. She now does these exercises at home.  She had pacemaker placed mid-August of this year.  Past Medical History:  Diagnosis Date   ARTHRITIS 02/20/2009   Qualifier: Diagnosis of  By: Hester Mates, Carol     CHEST PAIN-PRECORDIAL 03/02/2009   Qualifier: Diagnosis of  By: Verl Blalock, MD, Estevan Oaks C    Chronic insomnia    Degenerative arthritis    DEPRESSION 02/20/2009   Qualifier: Diagnosis of  By: Orville Govern, CMA, Carol     Dyslipidemia    FATIGUE 02/20/2009   Qualifier: Diagnosis of  By: Orville Govern CMA, Carol     GERD 02/20/2009   Qualifier: Diagnosis of  By: Orville Govern, CMA, Carol     History of lower leg fracture    History of peptic ulcer    History of seizures    In childhood   HYPERLIPIDEMIA-MIXED 05/04/2010   Qualifier: Diagnosis of  By: Verl Blalock, MD, Estevan Oaks C    Hypertension    Migraine    Mild obesity    MULTIPLE SCLEROSIS 02/20/2009   Qualifier: Diagnosis of  By: Orville Govern, CMA, Carol     Plantar fasciitis     Temporomandibular joint disease    Urinary incontinence    Patient Active Problem List   Diagnosis Date Noted   Complete heart block ([place]) 04/19/2021   Type 2 diabetes mellitus with complication, without long-term current use of insulin ([place]) 04/15/2021   Palpitations 04/15/2021   Gait disorder 11/04/2020   Urge incontinence 10/14/2020   Decreased estrogen level 09/08/2020   Midline cystocele 09/08/2020   Rectocele 09/08/2020   Urinary incontinence 09/08/2020   Uterine prolapse 09/08/2020   Dyspnea 08/09/2020   Educated about COVID-19 virus infection 02/08/2020   Common migraine 10/15/2014   HYPERLIPIDEMIA-MIXED 05/04/2010   PERS HX NONCOMPLIANCE W/MED TX PRS HAZARDS HLTH 05/04/2010   CHEST PAIN-UNSPECIFIED 03/02/2009   CHEST PAIN-PRECORDIAL 03/02/2009   DEPRESSION 02/20/2009   MULTIPLE SCLEROSIS 02/20/2009   HYPERTENSION, UNSPECIFIED 02/20/2009   GERD 02/20/2009   ARTHRITIS 02/20/2009   DEGENERATIVE [place] DISEASE 02/20/2009   FATIGUE 02/20/2009   DYSPNEA ON EXERTION 02/20/2009   FREQUENCY, URINARY 02/20/2009   Past Surgical History:  Procedure Laterality Date   APPENDECTOMY     GALLBLADDER SURGERY     metal plate resection     skull   PACEMAKER IMPLANT N/A 04/19/2021   Procedure: PACEMAKER IMPLANT;  Surgeon: Vickie Epley, MD;  Location: [facility];  Service: Cardiovascular;  Laterality: N/A;   pilonidal cyst  Current Outpatient Medications on File Prior to Visit  Medication Sig Dispense Refill   aspirin 325 MG tablet Take 325 mg by mouth every 6 (six) hours as needed for mild pain or headache.     benazepril (LOTENSIN) 40 MG tablet TAKE 1 TABLET BY MOUTH ONCE DAILY. 90 tablet 1   cephALEXin (KEFLEX) 500 MG capsule Take 1 capsule (500 mg total) by mouth 2 (two) times daily for 7 days. 14 capsule 0   Cholecalciferol (VITAMIN D3) 5000 UNITS TABS Take 5,000 Units by mouth daily.     Dimethyl Fumarate (TECFIDERA) 240 MG CPDR Take 1 capsule by mouth 2 times  daily. (Start after completing 120mg  capsule therapy) 60 capsule 3   eletriptan (RELPAX) 40 MG tablet TAKE 1 TABLET (40 MG TOTAL) BY MOUTH AS NEEDED FOR MIGRAINE. MAY REPEAT IN 2 HOURS IF HEADACHE PERSISTS OR RECURS. 24 tablet 5   famotidine (PEPCID) 20 MG tablet Take 20 mg by mouth daily as needed for heartburn or indigestion.     hydrochlorothiazide (HYDRODIURIL) 50 MG tablet TAKE 1/2 TABLET BY MOUTH AT BEDTIME 45 tablet 3   hydrochlorothiazide (HYDRODIURIL) 50 MG tablet Take 1/2 tablet by mouth at bedtime. 45 tablet 0   hydrOXYzine (ATARAX/VISTARIL) 25 MG tablet Take 1 tablet (25 mg total) by mouth every 6 (six) hours as needed for itching. 30 tablet 5   ibuprofen (ADVIL) 200 MG tablet Take 400 mg by mouth every 6 (six) hours as needed for mild pain.     meclizine (ANTIVERT) 25 MG tablet Take 25 mg by mouth 3 (three) times daily as needed for dizziness.     ondansetron (ZOFRAN-ODT) 8 MG disintegrating tablet Dissolve 1 tablet (8 mg total) by mouth every 8 (eight) hours as needed for nausea. 60 tablet 12   rosuvastatin (CRESTOR) 5 MG tablet Take 1 tablet by mouth daily for cholesterol. 90 tablet 3   No current facility-administered medications on file prior to visit.    Allergies  Allergen Reactions   Niacin Anaphylaxis, Hives, Itching and Rash   Social History   Occupational History   Occupation: Equities trader  Tobacco Use   Smoking status: Never   Smokeless tobacco: Never  Substance and Sexual Activity   Alcohol use: No   Drug use: No   Sexual activity: Not on file   Family History  Adopted: Yes  Problem Relation Age of Onset   Lupus Sister    Cancer Sister    Heart attack Sister    Heart attack Sister    Breast cancer Neg Hx    Immunization History  Administered Date(s) Administered   PFIZER(Purple Top)SARS-COV-2 Vaccination 11/15/2019, 12/09/2019     Review of Systems: Negative except as noted in the HPI.   Objective: There were no vitals filed for this  visit.  Katherine Yu is a pleasant 70 y.o. female in NAD. AAO X 3.  Vascular Examination: Capillary refill time to digits immediate b/l. Palpable pedal pulses b/l LE. Pedal hair sparse. Lower extremity skin temperature gradient within normal limits. No edema noted b/l lower extremities.  Dermatological Examination: Pedal skin with normal turgor, texture and tone b/l lower extremities. Nondystrophic toenails b/l lower extremities. No hyperkeratotic nor porokeratotic lesions present on today's visit.  Musculoskeletal Examination: Normal muscle strength 5/5 to all lower extremity muscle groups bilaterally. Negative Tinel's sign lateral ankle. Minimal tenderness to palpation.  Footwear Assessment: Does the patient wear appropriate shoes? Yes. Does the patient need inserts/orthotics? Yes.  Neurological Examination: Pt  has subjective symptoms of burning posterolateral heel RLE.  Protective sensation intact 5/5 intact bilaterally with 10g monofilament b/l. Vibratory sensation intact b/l. Proprioception intact bilaterally.  Hemoglobin A1C Latest Ref Rng & Units 01/26/2021  HGBA1C 4.8 - 5.6 % 5.8(H)  Some recent data might be hidden   Assessment: 1. Peroneal neuritis, right   2. Plantar fasciitis of right foot   3. Type 2 diabetes mellitus with complication, without long-term current use of insulin ([place])   4. Encounter for diabetic foot exam ([place])     ADA Risk Categorization: Low Risk :  Patient has all of the following: Intact protective sensation No prior foot ulcer  No severe deformity Pedal pulses present  Plan: -Examined patient. Annual examination today. -Toenail trimmed without complication. -Patient with h/o diabetes and MS has been treated conservatively for plantar fasciitis right foot. She has had physical therapy, plantar fascial brace (which she does not tolerate), has PowerStep [employer]. She has Chartered loss adjuster as well as Optician, dispensing. At this  point, I suspect she may have symptoms of neuritis RLE. Plantar fasciitis RLE waxing and waning. Will refer her to Dr. Sherryle Lis for further work-up . -Patient to continue soft, supportive shoe gear daily. -Patient to report any pedal injuries to medical professional immediately. -Patient/POA to call should there be question/concern in the interim.  Return in about 3 months (around 09/08/2021).  Marzetta Board, DPM

## 2021-06-09 ENCOUNTER — Other Ambulatory Visit (HOSPITAL_COMMUNITY): Payer: Self-pay

## 2021-06-15 ENCOUNTER — Other Ambulatory Visit: Payer: Self-pay

## 2021-06-15 ENCOUNTER — Ambulatory Visit
Admission: RE | Admit: 2021-06-15 | Discharge: 2021-06-15 | Disposition: A | Discharge status: Home / Self Care | Payer: 59 | Source: Ambulatory Visit | Attending: Obstetrics and Gynecology | Admitting: Obstetrics and Gynecology

## 2021-06-15 DIAGNOSIS — Z1231 Encounter for screening mammogram for malignant neoplasm of breast: Secondary | ICD-10-CM | POA: Diagnosis not present

## 2021-06-23 ENCOUNTER — Ambulatory Visit (INDEPENDENT_AMBULATORY_CARE_PROVIDER_SITE_OTHER): Payer: 59

## 2021-06-23 ENCOUNTER — Telehealth: Payer: Self-pay

## 2021-06-23 ENCOUNTER — Other Ambulatory Visit: Payer: Self-pay

## 2021-06-23 DIAGNOSIS — I442 Atrioventricular block, complete: Secondary | ICD-10-CM

## 2021-06-23 NOTE — Telephone Encounter (Signed)
The pt states her wound has not healed. Katherine Yu is calling the patient back.

## 2021-06-23 NOTE — Telephone Encounter (Signed)
Per Dr. Quentin Ore, pt to come in today for stitch removal by PA.  Spoke with pt, she can come at 4pm.  Appt scheduled.

## 2021-06-23 NOTE — Telephone Encounter (Signed)
Patient reports of increased itching at site and a "white loop" as well as another white area (not clear in picture). Denies any drainage, fever or chills. Advised patient I will forward to Dr. Quentin Ore to see what his recommendations would be considering recent Antibiotics completed. Patient agreeable with plan.    See encounter from 06/04/21 in device clinic. Previous images of site available.

## 2021-06-23 NOTE — Progress Notes (Signed)
Patient presented to device clinic today to be evaluated for suture abcess at device implant site. Tommye Standard, PA in to evaluate. Suture able to be removed with simple cleaning of site. Patient instructed to keep area clean with mild bath soap and leave open to air. She is provided device clinic contact for additional questions or concerns.

## 2021-06-23 NOTE — Telephone Encounter (Signed)
Pt was able to send picture via mychart.

## 2021-06-26 ENCOUNTER — Other Ambulatory Visit (HOSPITAL_COMMUNITY): Payer: Self-pay

## 2021-07-05 ENCOUNTER — Other Ambulatory Visit (HOSPITAL_COMMUNITY): Payer: Self-pay

## 2021-07-14 ENCOUNTER — Other Ambulatory Visit (HOSPITAL_COMMUNITY): Payer: Self-pay

## 2021-07-26 ENCOUNTER — Ambulatory Visit (INDEPENDENT_AMBULATORY_CARE_PROVIDER_SITE_OTHER): Payer: 59

## 2021-07-26 DIAGNOSIS — I442 Atrioventricular block, complete: Secondary | ICD-10-CM

## 2021-07-26 LAB — CUP PACEART REMOTE DEVICE CHECK
Battery Remaining Longevity: 154 mo
Battery Voltage: 3.19 V
Brady Statistic AP VP Percent: 0.1 %
Brady Statistic AP VS Percent: 0 %
Brady Statistic AS VP Percent: 99.89 %
Brady Statistic AS VS Percent: 0.01 %
Brady Statistic RA Percent Paced: 0.1 %
Brady Statistic RV Percent Paced: 99.99 %
Date Time Interrogation Session: 20221120223050
Implantable Lead Implant Date: 20220815
Implantable Lead Implant Date: 20220815
Implantable Lead Location: 753859
Implantable Lead Location: 753860
Implantable Lead Model: 3830
Implantable Lead Model: 5076
Implantable Pulse Generator Implant Date: 20220815
Lead Channel Impedance Value: 342 Ohm
Lead Channel Impedance Value: 361 Ohm
Lead Channel Impedance Value: 456 Ohm
Lead Channel Impedance Value: 684 Ohm
Lead Channel Pacing Threshold Amplitude: 0.625 V
Lead Channel Pacing Threshold Amplitude: 0.875 V
Lead Channel Pacing Threshold Pulse Width: 0.4 ms
Lead Channel Pacing Threshold Pulse Width: 0.4 ms
Lead Channel Sensing Intrinsic Amplitude: 11.5 mV
Lead Channel Sensing Intrinsic Amplitude: 2.875 mV
Lead Channel Sensing Intrinsic Amplitude: 2.875 mV
Lead Channel Setting Pacing Amplitude: 1.5 V
Lead Channel Setting Pacing Amplitude: 2 V
Lead Channel Setting Pacing Pulse Width: 0.4 ms
Lead Channel Setting Sensing Sensitivity: 0.9 mV

## 2021-07-28 ENCOUNTER — Other Ambulatory Visit (HOSPITAL_COMMUNITY): Payer: Self-pay

## 2021-07-28 ENCOUNTER — Other Ambulatory Visit: Payer: Self-pay

## 2021-07-28 ENCOUNTER — Ambulatory Visit (INDEPENDENT_AMBULATORY_CARE_PROVIDER_SITE_OTHER): Payer: 59 | Admitting: Cardiology

## 2021-07-28 ENCOUNTER — Encounter: Payer: Self-pay | Admitting: Cardiology

## 2021-07-28 VITALS — BP 124/70 | HR 101 | Ht 66.0 in | Wt 171.6 lb

## 2021-07-28 DIAGNOSIS — R072 Precordial pain: Secondary | ICD-10-CM

## 2021-07-28 DIAGNOSIS — Z950 Presence of cardiac pacemaker: Secondary | ICD-10-CM

## 2021-07-28 DIAGNOSIS — I442 Atrioventricular block, complete: Secondary | ICD-10-CM | POA: Diagnosis not present

## 2021-07-28 DIAGNOSIS — I10 Essential (primary) hypertension: Secondary | ICD-10-CM | POA: Diagnosis not present

## 2021-07-28 LAB — CUP PACEART INCLINIC DEVICE CHECK
Battery Remaining Longevity: 154 mo
Battery Voltage: 3.19 V
Brady Statistic AP VP Percent: 0.1 %
Brady Statistic AP VS Percent: 0 %
Brady Statistic AS VP Percent: 99.89 %
Brady Statistic AS VS Percent: 0.01 %
Brady Statistic RA Percent Paced: 0.1 %
Brady Statistic RV Percent Paced: 99.99 %
Date Time Interrogation Session: 20221123161809
Implantable Lead Implant Date: 20220815
Implantable Lead Implant Date: 20220815
Implantable Lead Location: 753859
Implantable Lead Location: 753860
Implantable Lead Model: 3830
Implantable Lead Model: 5076
Implantable Pulse Generator Implant Date: 20220815
Lead Channel Impedance Value: 380 Ohm
Lead Channel Impedance Value: 380 Ohm
Lead Channel Impedance Value: 475 Ohm
Lead Channel Impedance Value: 684 Ohm
Lead Channel Pacing Threshold Amplitude: 0.75 V
Lead Channel Pacing Threshold Amplitude: 1 V
Lead Channel Pacing Threshold Pulse Width: 0.4 ms
Lead Channel Pacing Threshold Pulse Width: 0.4 ms
Lead Channel Sensing Intrinsic Amplitude: 3.75 mV
Lead Channel Setting Pacing Amplitude: 1.5 V
Lead Channel Setting Pacing Amplitude: 2 V
Lead Channel Setting Pacing Pulse Width: 0.4 ms
Lead Channel Setting Sensing Sensitivity: 0.9 mV

## 2021-07-28 MED ORDER — METOPROLOL SUCCINATE ER 25 MG PO TB24
25.0000 mg | ORAL_TABLET | Freq: Every day | ORAL | 3 refills | Status: DC
  Filled 2021-07-28: qty 90, 90d supply, fill #0

## 2021-07-28 MED ORDER — METOPROLOL TARTRATE 100 MG PO TABS
ORAL_TABLET | ORAL | 0 refills | Status: DC
  Filled 2021-07-28: qty 1, 1d supply, fill #0

## 2021-07-28 NOTE — Patient Instructions (Addendum)
Medication Instructions:  Your physician has recommended you make the following change in your medication:    START taking metoprolol succinate 25 mg-  Take one tablet by mouth daily   Lab Work: You will get lab work today:  CBC, CMP, TSH and free T4 If you have labs (blood work) drawn today and your tests are completely normal, you will receive your results only by: [employer] (if you have MyChart) OR A paper copy in the mail If you have any lab test that is abnormal or we need to change your treatment, we will call you to review the results.  Testing/Procedures: Your physician has requested that you have cardiac CT. Cardiac computed tomography (CT) is a painless test that uses an x-ray machine to take clear, detailed pictures of your heart.     Follow-Up: At [facility], you and your health needs are our priority.  As part of our continuing mission to provide you with exceptional heart care, we have created designated Provider Care Teams.  These Care Teams include your primary Cardiologist (physician) and Advanced Practice Providers (APPs -  Physician Assistants and Nurse Practitioners) who all work together to provide you with the care you need, when you need it.  Your next appointment:   Your physician wants you to follow-up in: 6-8 weeks with Dr. Percival Spanish.  Your physician wants you to follow-up in: 9 months with Dr. Quentin Ore.  You will receive a reminder letter in the mail two months in advance. If you don't receive a letter, please call our office to schedule the follow-up appointment.  Remote monitoring is used to monitor your Pacemaker from home. This monitoring reduces the number of office visits required to check your device to one time per year. It allows [place] to keep an eye on the functioning of your device to ensure it is working properly. You are scheduled for a device check from home on 10/25/2021. You may send your transmission at any time that day. If you have a  wireless device, the transmission will be sent automatically. After your physician reviews your transmission, you will receive a postcard with your next transmission date.

## 2021-07-28 NOTE — Progress Notes (Signed)
Electrophysiology Office Follow up Visit Note:    Date:  07/28/2021   ID:  Katherine Yu, DOB 04-Feb-1951, MRN [id number]  PCP:  Leonard Downing, MD  [facility] Cardiologist:  Minus Breeding, MD  [facility] Electrophysiologist:  Vickie Epley, MD    Interval History:    Katherine Yu Katherine Yu is a 70 y.o. female who presents for a follow up visit.  She had a dual-chamber permanent pacemaker with left bundle area pacemaker lead implanted April 19, 2021.  Since that time she has done well.  She is 100% ventricular paced.  She tells me that her heart rates have been persistently elevated since pacemaker implant.  Unclear if this is more prominent just because she was in complete heart block prior to pacer going in.  She also tells me that she has been having episodes of chest discomfort that lasts minutes to hours at a time and come on without specific triggers.  She describes the pain as a bandlike pressure.  Sometimes is accompanied with shortness of breath and nausea.  There is no positional component.  No syncope or presyncope.      Past Medical History:  Diagnosis Date   ARTHRITIS 02/20/2009   Qualifier: Diagnosis of  By: Hester Mates, Carol     CHEST PAIN-PRECORDIAL 03/02/2009   Qualifier: Diagnosis of  By: Verl Blalock, MD, Estevan Oaks C    Chronic insomnia    Degenerative arthritis    DEPRESSION 02/20/2009   Qualifier: Diagnosis of  By: Orville Govern, CMA, Carol     Dyslipidemia    FATIGUE 02/20/2009   Qualifier: Diagnosis of  By: Orville Govern CMA, Carol     GERD 02/20/2009   Qualifier: Diagnosis of  By: Orville Govern CMA, Carol     History of lower leg fracture    History of peptic ulcer    History of seizures    In childhood   HYPERLIPIDEMIA-MIXED 05/04/2010   Qualifier: Diagnosis of  By: Verl Blalock, MD, Estevan Oaks C    Hypertension    Migraine    Mild obesity    MULTIPLE SCLEROSIS 02/20/2009   Qualifier: Diagnosis of  By: Orville Govern, CMA, Carol     Plantar fasciitis     Temporomandibular joint disease    Urinary incontinence     Past Surgical History:  Procedure Laterality Date   APPENDECTOMY     GALLBLADDER SURGERY     metal plate resection     skull   PACEMAKER IMPLANT N/A 04/19/2021   Procedure: PACEMAKER IMPLANT;  Surgeon: Vickie Epley, MD;  Location: [place] [facility];  Service: Cardiovascular;  Laterality: N/A;   pilonidal cyst      Current Medications: Current Meds  Medication Sig   aspirin 325 MG tablet Take 325 mg by mouth every 6 (six) hours as needed for mild pain or headache.   benazepril (LOTENSIN) 40 MG tablet TAKE 1 TABLET BY MOUTH ONCE DAILY.   Cholecalciferol (VITAMIN D3) 5000 UNITS TABS Take 5,000 Units by mouth daily.   Dimethyl Fumarate (TECFIDERA) 240 MG CPDR Take 1 capsule by mouth 2 times daily. (Start after completing 149m capsule therapy)   eletriptan (RELPAX) 40 MG tablet TAKE 1 TABLET (40 MG TOTAL) BY MOUTH AS NEEDED FOR MIGRAINE. MAY REPEAT IN 2 HOURS IF HEADACHE PERSISTS OR RECURS.   famotidine (PEPCID) 20 MG tablet Take 20 mg by mouth daily as needed for heartburn or indigestion.   hydrochlorothiazide (HYDRODIURIL) 50 MG tablet Take 1/2 tablet by mouth  at bedtime.   hydrOXYzine (ATARAX/VISTARIL) 25 MG tablet Take 1 tablet (25 mg total) by mouth every 6 (six) hours as needed for itching.   ibuprofen (ADVIL) 200 MG tablet Take 400 mg by mouth every 6 (six) hours as needed for mild pain.   meclizine (ANTIVERT) 25 MG tablet Take 25 mg by mouth 3 (three) times daily as needed for dizziness.   metoprolol succinate (TOPROL XL) 25 MG 24 hr tablet Take 1 tablet (25 mg total) by mouth daily.   ondansetron (ZOFRAN-ODT) 8 MG disintegrating tablet Dissolve 1 tablet (8 mg total) by mouth every 8 (eight) hours as needed for nausea.   rosuvastatin (CRESTOR) 5 MG tablet Take 1 tablet by mouth daily for cholesterol.     Allergies:   Niacin   Social History   Socioeconomic History   Marital status: Married    Spouse name:  Mikeal Hawthorne   Number of children: 3   Years of education: Masters   Highest education level: Not on file  Occupational History   Occupation: Equities trader  Tobacco Use   Smoking status: Never   Smokeless tobacco: Never  Substance and Sexual Activity   Alcohol use: No   Drug use: No   Sexual activity: Not on file  Other Topics Concern   Not on file  Social History Narrative   Patient is right handed.   Patient drinks 1 cup caffeine daily.   Lives with husband and son.    Social Determinants of Health   Financial Resource Strain: Not on file  Food Insecurity: Not on file  Transportation Needs: Not on file  Physical Activity: Not on file  Stress: Not on file  Social Connections: Not on file     Family History: The patient's family history includes Cancer in her sister; Heart attack in her sister and sister; Lupus in her sister. There is no history of Breast cancer. She was adopted.  ROS:   Please see the history of present illness.    All other systems reviewed and are negative.  EKGs/Labs/Other Studies Reviewed:    The following studies were reviewed today:  April 20, 2021 EKG shows a sensed, V paced with left bundle area pacemaker morphology.  QRS duration 148 ms.  EKG:  The ekg ordered today demonstrates sinus rhythm, a sensed, V paced.  QRS duration 156 ms.  Recent Labs: 01/26/2021: ALT 25 04/16/2021: TSH 2.320 04/19/2021: BUN 25; Creatinine, Ser 1.05; Hemoglobin 15.9; Platelets 192; Potassium 3.6; Sodium 141  Recent Lipid Panel    Component Value Date/Time   CHOL 146 08/10/2020 0903   TRIG 85 08/10/2020 0903   HDL 42 08/10/2020 0903   CHOLHDL 3.5 08/10/2020 0903   CHOLHDL 4.6 CALC 01/17/2008 1250   VLDL 27 01/17/2008 1250   LDLCALC 88 08/10/2020 0903    Physical Exam:    VS:  BP 124/70   Pulse (!) 101   Ht _0  (1.676 m)   Wt 171 lb 9.6 oz (77.8 kg)   SpO2 98%   BMI 27.70 kg/m     Wt Readings from Last 3 Encounters:  07/28/21 171 lb 9.6 oz (77.8  kg)  04/20/21 156 lb 1.6 oz (70.8 kg)  04/16/21 155 lb (70.3 kg)     GEN:  Well nourished, well developed in no acute distress HEENT: Normal NECK: No JVD; No carotid bruits LYMPHATICS: No lymphadenopathy CARDIAC: RRR, no murmurs, rubs, gallops.  Pacemaker pocket well-healed RESPIRATORY:  Clear to auscultation without rales, wheezing or rhonchi  ABDOMEN: Soft, non-tender, non-distended MUSCULOSKELETAL:  No edema; No deformity  SKIN: Warm and dry NEUROLOGIC:  Alert and oriented x 3 PSYCHIATRIC:  Normal affect    Limited bedside echocardiogram performed and shows normal LV and RV function.  No pericardial effusion.  Appropriately placed RV lead.    ASSESSMENT:    1. Complete heart block (HCC)   2. Cardiac pacemaker in situ   3. Primary hypertension   4. Precordial pain    PLAN:    In order of problems listed above:  #Complete heart block #Pacemaker in situ Device functioning appropriately.  She has appropriate variation between the sleep wake heart rates but her awake heart rates are persistently in the 90s.  She says this is very noticeable to her.  Unclear if is just a significant difference between her heart rates prepacemaker implant.  I did a quick bedside echocardiogram today which shows no pericardial effusion.  I can clearly see the left bundle lead entering the interventricular septum.  LV and RV function look normal.  #Precordial pain Her description of the pain is concerning for ischemic heart disease.  I would like to start with a CT assessment of her coronaries with possible FFR.  I would also like her to get seen by Dr. Rosezella [place] office in follow-up.  Also check CMP, CBC, TSH and free T4 today to make sure there is no other cause of her elevated heart rates.  We will also start Toprol-XL 25 mg by mouth once daily.   Follow-up with me in 9 months or sooner as needed.  I will follow the results of the above tests and adjust follow-up as needed. Follow-up with  general cardiology in 3 months.         Total time spent with patient today 45 minutes. This includes reviewing records, evaluating the patient and coordinating care.   Medication Adjustments/Labs and Tests Ordered: Current medicines are reviewed at length with the patient today.  Concerns regarding medicines are outlined above.  Orders Placed This Encounter  Procedures   CT CORONARY MORPH W/CTA COR W/SCORE W/CA W/CM &/OR WO/CM   CBC w/Diff   Comp Met (CMET)   TSH   T4, free   EKG 12-Lead    Meds ordered this encounter  Medications   metoprolol succinate (TOPROL XL) 25 MG 24 hr tablet    Sig: Take 1 tablet (25 mg total) by mouth daily.    Dispense:  90 tablet    Refill:  3      Signed, Lars Mage, MD, [facility], [facility] 07/28/2021 2:06 PM    Electrophysiology [facility]

## 2021-07-29 LAB — CBC WITH DIFFERENTIAL/PLATELET
Basophils Absolute: 0 10*3/uL (ref 0.0–0.2)
Basos: 1 %
EOS (ABSOLUTE): 0.1 10*3/uL (ref 0.0–0.4)
Eos: 3 %
Hematocrit: 43.4 % (ref 34.0–46.6)
Hemoglobin: 15.6 g/dL (ref 11.1–15.9)
Immature Grans (Abs): 0 10*3/uL (ref 0.0–0.1)
Immature Granulocytes: 0 %
Lymphocytes Absolute: 1 10*3/uL (ref 0.7–3.1)
Lymphs: 22 %
MCH: 31.1 pg (ref 26.6–33.0)
MCHC: 35.9 g/dL — ABNORMAL HIGH (ref 31.5–35.7)
MCV: 87 fL (ref 79–97)
Monocytes Absolute: 0.4 10*3/uL (ref 0.1–0.9)
Monocytes: 9 %
Neutrophils Absolute: 2.9 10*3/uL (ref 1.4–7.0)
Neutrophils: 65 %
Platelets: 220 10*3/uL (ref 150–450)
RBC: 5.02 x10E6/uL (ref 3.77–5.28)
RDW: 13 % (ref 11.7–15.4)
WBC: 4.5 10*3/uL (ref 3.4–10.8)

## 2021-07-29 LAB — COMPREHENSIVE METABOLIC PANEL
ALT: 26 IU/L (ref 0–32)
AST: 23 IU/L (ref 0–40)
Albumin/Globulin Ratio: 2 (ref 1.2–2.2)
Albumin: 4.9 g/dL — ABNORMAL HIGH (ref 3.8–4.8)
Alkaline Phosphatase: 71 IU/L (ref 44–121)
BUN/Creatinine Ratio: 28 (ref 12–28)
BUN: 22 mg/dL (ref 8–27)
Bilirubin Total: <0.2 mg/dL (ref 0.0–1.2)
CO2: 27 mmol/L (ref 20–29)
Calcium: 9.7 mg/dL (ref 8.7–10.3)
Chloride: 102 mmol/L (ref 96–106)
Creatinine, Ser: 0.79 mg/dL (ref 0.57–1.00)
Globulin, Total: 2.4 g/dL (ref 1.5–4.5)
Glucose: 113 mg/dL — ABNORMAL HIGH (ref 70–99)
Potassium: 3.8 mmol/L (ref 3.5–5.2)
Sodium: 144 mmol/L (ref 134–144)
Total Protein: 7.3 g/dL (ref 6.0–8.5)
eGFR: 81 mL/min/{1.73_m2} (ref >59)

## 2021-07-29 LAB — TSH: TSH: 2.88 u[IU]/mL (ref 0.450–4.500)

## 2021-07-29 LAB — T4, FREE: Free T4: 0.95 ng/dL (ref 0.82–1.77)

## 2021-08-03 NOTE — Progress Notes (Signed)
Remote pacemaker transmission.   

## 2021-08-05 ENCOUNTER — Other Ambulatory Visit: Payer: Self-pay | Admitting: Internal Medicine

## 2021-08-05 ENCOUNTER — Other Ambulatory Visit (HOSPITAL_COMMUNITY): Payer: Self-pay

## 2021-08-05 DIAGNOSIS — E119 Type 2 diabetes mellitus without complications: Secondary | ICD-10-CM | POA: Diagnosis not present

## 2021-08-05 DIAGNOSIS — R799 Abnormal finding of blood chemistry, unspecified: Secondary | ICD-10-CM | POA: Diagnosis not present

## 2021-08-05 DIAGNOSIS — I10 Essential (primary) hypertension: Secondary | ICD-10-CM | POA: Diagnosis not present

## 2021-08-05 DIAGNOSIS — Z23 Encounter for immunization: Secondary | ICD-10-CM | POA: Diagnosis not present

## 2021-08-05 DIAGNOSIS — E039 Hypothyroidism, unspecified: Secondary | ICD-10-CM | POA: Diagnosis not present

## 2021-08-06 ENCOUNTER — Other Ambulatory Visit: Payer: Self-pay | Admitting: Neurology

## 2021-08-06 ENCOUNTER — Other Ambulatory Visit (HOSPITAL_COMMUNITY): Payer: Self-pay

## 2021-08-07 ENCOUNTER — Other Ambulatory Visit (HOSPITAL_COMMUNITY): Payer: Self-pay

## 2021-08-08 ENCOUNTER — Other Ambulatory Visit (HOSPITAL_COMMUNITY): Payer: Self-pay

## 2021-08-08 ENCOUNTER — Other Ambulatory Visit: Payer: Self-pay | Admitting: Neurology

## 2021-08-09 ENCOUNTER — Telehealth (HOSPITAL_COMMUNITY): Payer: Self-pay | Admitting: *Deleted

## 2021-08-09 ENCOUNTER — Other Ambulatory Visit (HOSPITAL_COMMUNITY): Payer: Self-pay

## 2021-08-09 DIAGNOSIS — H40013 Open angle with borderline findings, low risk, bilateral: Secondary | ICD-10-CM | POA: Diagnosis not present

## 2021-08-09 DIAGNOSIS — H04123 Dry eye syndrome of bilateral lacrimal glands: Secondary | ICD-10-CM | POA: Diagnosis not present

## 2021-08-09 DIAGNOSIS — E119 Type 2 diabetes mellitus without complications: Secondary | ICD-10-CM | POA: Diagnosis not present

## 2021-08-09 DIAGNOSIS — H2513 Age-related nuclear cataract, bilateral: Secondary | ICD-10-CM | POA: Diagnosis not present

## 2021-08-09 DIAGNOSIS — H524 Presbyopia: Secondary | ICD-10-CM | POA: Diagnosis not present

## 2021-08-09 LAB — HM DIABETES EYE EXAM

## 2021-08-09 MED ORDER — DIMETHYL FUMARATE 240 MG PO CPDR
240.0000 mg | DELAYED_RELEASE_CAPSULE | Freq: Two times a day (BID) | ORAL | 3 refills | Status: DC
  Filled 2021-08-09: qty 60, 30d supply, fill #0

## 2021-08-09 NOTE — Telephone Encounter (Signed)
Reaching out to patient to offer assistance regarding upcoming cardiac imaging study; pt verbalizes understanding of appt date/time, parking situation and where to check in, pre-test NPO status and medications ordered, and verified current allergies; name and call back number provided for further questions should they arise  Gordy Clement RN Navigator Cardiac Imaging [facility] [phone] office [phone] cell  Patient to take 100mg  metoprolol tartrate two hours prior to her cardiac CT scan.  She is aware to arrive at 7:15am for her 7:45am scan.  Patient has a pacemaker with her lower rate set at 60bpm.

## 2021-08-10 ENCOUNTER — Other Ambulatory Visit: Payer: Self-pay | Admitting: Pharmacist

## 2021-08-10 ENCOUNTER — Other Ambulatory Visit (HOSPITAL_COMMUNITY): Payer: Self-pay

## 2021-08-10 MED ORDER — DIMETHYL FUMARATE 240 MG PO CPDR
240.0000 mg | DELAYED_RELEASE_CAPSULE | Freq: Two times a day (BID) | ORAL | 3 refills | Status: DC
  Filled 2021-08-10: qty 60, 30d supply, fill #0
  Filled 2021-09-02: qty 60, 30d supply, fill #1

## 2021-08-10 MED ORDER — HYDROCHLOROTHIAZIDE 50 MG PO TABS
ORAL_TABLET | ORAL | 0 refills | Status: DC
  Filled 2021-08-10: qty 45, 90d supply, fill #0

## 2021-08-11 ENCOUNTER — Other Ambulatory Visit: Payer: Self-pay

## 2021-08-11 ENCOUNTER — Other Ambulatory Visit (HOSPITAL_COMMUNITY): Payer: Self-pay

## 2021-08-11 ENCOUNTER — Ambulatory Visit (HOSPITAL_COMMUNITY)
Admission: RE | Admit: 2021-08-11 | Discharge: 2021-08-11 | Disposition: A | Discharge status: Home / Self Care | Payer: 59 | Source: Ambulatory Visit | Attending: Cardiology | Admitting: Cardiology

## 2021-08-11 DIAGNOSIS — R072 Precordial pain: Secondary | ICD-10-CM | POA: Insufficient documentation

## 2021-08-11 MED ORDER — NITROGLYCERIN 0.4 MG SL SUBL
SUBLINGUAL_TABLET | SUBLINGUAL | Status: AC
  Filled 2021-08-11: qty 2

## 2021-08-11 MED ORDER — IOHEXOL 350 MG/ML SOLN
100.0000 mL | Freq: Once | INTRAVENOUS | Status: AC
  Administered 2021-08-11: 95 mL via INTRAVENOUS

## 2021-08-11 MED ORDER — METOPROLOL TARTRATE 5 MG/5ML IV SOLN: 5.0000 mg | INTRAVENOUS | Status: DC | PRN

## 2021-08-11 MED ORDER — NITROGLYCERIN 0.4 MG SL SUBL
0.8000 mg | SUBLINGUAL_TABLET | Freq: Once | SUBLINGUAL | Status: AC
  Administered 2021-08-11: 0.8 mg via SUBLINGUAL

## 2021-08-24 ENCOUNTER — Other Ambulatory Visit (HOSPITAL_COMMUNITY): Payer: Self-pay

## 2021-08-25 ENCOUNTER — Other Ambulatory Visit (HOSPITAL_COMMUNITY): Payer: Self-pay

## 2021-08-25 MED ORDER — BENAZEPRIL HCL 40 MG PO TABS
40.0000 mg | ORAL_TABLET | Freq: Every day | ORAL | 1 refills | Status: DC
  Filled 2021-08-25: qty 90, 90d supply, fill #0
  Filled 2021-11-27: qty 90, 90d supply, fill #1

## 2021-08-31 ENCOUNTER — Other Ambulatory Visit (HOSPITAL_COMMUNITY): Payer: Self-pay

## 2021-09-02 ENCOUNTER — Other Ambulatory Visit (HOSPITAL_COMMUNITY): Payer: Self-pay

## 2021-09-06 NOTE — Progress Notes (Signed)
2    Cardiology Office Note   Date:  09/07/2021   ID:  Avanell, Banwart [birth year], MRN [id number]  PCP:  Leonard Downing, MD  Cardiologist:   Minus Breeding, MD   Chief Complaint  Patient presents with   Palpitations    History of Present Illness: Katherine Yu is a 71 y.o. female who is referred by Leonard Downing, MD for evaluation of chest pain.  She had a negative POET (Plain Old Exercise Treadmill).    She had runs of SVT and CHB .  She is now status post PPM.  At her follow up with Dr. Quentin Ore she had chest pain and was sent for a coronary CT .  She had no coronary disease.    Since she was last seen she is occasionally had some chest discomfort but now she ignores it.  She does feel her heart racing at times particularly if she is out walking it will go fast into the 110s 120s or so.  She stops walking and it will slow back down.  She says that it is typically above 90 and she sees it at 33 she thinks that is pretty low.  She denies any presyncope or syncope.  She is not having any chest pressure, neck or arm discomfort.  She had no weight gain or edema.  She has had some itching at her pacemaker site.   Past Medical History:  Diagnosis Date   ARTHRITIS 02/20/2009   Qualifier: Diagnosis of  By: Hester Mates, Carol     CHEST PAIN-PRECORDIAL 03/02/2009   Qualifier: Diagnosis of  By: Verl Blalock, MD, Estevan Oaks C    Chronic insomnia    Degenerative arthritis    DEPRESSION 02/20/2009   Qualifier: Diagnosis of  By: Orville Govern, CMA, Carol     Dyslipidemia    FATIGUE 02/20/2009   Qualifier: Diagnosis of  By: Orville Govern CMA, Carol     GERD 02/20/2009   Qualifier: Diagnosis of  By: Orville Govern CMA, Carol     History of lower leg fracture    History of peptic ulcer    History of seizures    In childhood   HYPERLIPIDEMIA-MIXED 05/04/2010   Qualifier: Diagnosis of  By: Verl Blalock, MD, Estevan Oaks C    Hypertension    Migraine    Mild obesity    MULTIPLE SCLEROSIS 02/20/2009   Qualifier:  Diagnosis of  By: Orville Govern, CMA, Carol     Plantar fasciitis    Temporomandibular joint disease    Urinary incontinence     Past Surgical History:  Procedure Laterality Date   APPENDECTOMY     GALLBLADDER SURGERY     metal plate resection     skull   PACEMAKER IMPLANT N/A 04/19/2021   Procedure: PACEMAKER IMPLANT;  Surgeon: Vickie Epley, MD;  Location: [place] [facility];  Service: Cardiovascular;  Laterality: N/A;   pilonidal cyst       Current Outpatient Medications  Medication Sig Dispense Refill   aspirin 325 MG tablet Take 325 mg by mouth every 6 (six) hours as needed for mild pain or headache.     benazepril (LOTENSIN) 40 MG tablet TAKE 1 TABLET BY MOUTH ONCE DAILY. 90 tablet 1   Cholecalciferol (VITAMIN D3) 5000 UNITS TABS Take 5,000 Units by mouth daily.     Dimethyl Fumarate (TECFIDERA) 240 MG CPDR Take 1 capsule by mouth 2 times daily. (Start after completing 120mg  twice daily  for 2 weeks.) 60 capsule 3  Glatiramer Acetate 40 MG/ML SOSY 1 ml     hydrochlorothiazide (HYDRODIURIL) 50 MG tablet Take 1/2 tablet by mouth at bedtime. 45 tablet 0   hydrOXYzine (ATARAX) 25 MG tablet Take 1 tablet (25 mg total) by mouth every 6 (six) hours as needed for itching. 30 tablet 5   meclizine (ANTIVERT) 25 MG tablet Take 25 mg by mouth 3 (three) times daily as needed for dizziness.     metoprolol succinate (TOPROL-XL) 50 MG 24 hr tablet Take 1 tablet (50 mg total) by mouth daily. Take with or immediately following a meal. 90 tablet 3   rosuvastatin (CRESTOR) 5 MG tablet Take 1 tablet by mouth daily for cholesterol. 90 tablet 3   eletriptan (RELPAX) 40 MG tablet TAKE 1 TABLET (40 MG TOTAL) BY MOUTH AS NEEDED FOR MIGRAINE. MAY REPEAT IN 2 HOURS IF HEADACHE PERSISTS OR RECURS. (Patient not taking: Reported on 09/07/2021) 24 tablet 5   famotidine (PEPCID) 20 MG tablet Take 20 mg by mouth daily as needed for heartburn or indigestion. (Patient not taking: Reported on 09/07/2021)      hydrochlorothiazide (HYDRODIURIL) 50 MG tablet TAKE 1/2 TABLET BY MOUTH AT BEDTIME 45 tablet 3   ibuprofen (ADVIL) 200 MG tablet Take 400 mg by mouth every 6 (six) hours as needed for mild pain. (Patient not taking: Reported on 09/07/2021)     metoprolol tartrate (LOPRESSOR) 100 MG tablet 1 tablet by mouth 2 hours prior to cardiac CT 1 tablet 0   ondansetron (ZOFRAN-ODT) 8 MG disintegrating tablet Dissolve 1 tablet (8 mg total) by mouth every 8 (eight) hours as needed for nausea. (Patient not taking: Reported on 09/07/2021) 60 tablet 12   No current facility-administered medications for this visit.    Allergies:   Niacin   ROS:  Please see the history of present illness.   Otherwise, review of systems are positive for none.   All other systems are reviewed and negative.    PHYSICAL EXAM: VS:  BP 122/80 (BP Location: Left Arm, Patient Position: Sitting, Cuff Size: Normal)    Pulse 86    Ht 5' 6.5" (1.689 m)    Wt 172 lb 6.4 oz (78.2 kg)    SpO2 99%    BMI 27.41 kg/m  , BMI Body mass index is 27.41 kg/m. GENERAL:  Well appearing NECK:  No jugular venous distention, waveform within normal limits, carotid upstroke brisk and symmetric, no bruits, no thyromegaly LUNGS:  Clear to auscultation bilaterally CHEST: No pacemaker scar HEART:  PMI not displaced or sustained,S1 and S2 within normal limits, no S3, no S4, no clicks, no rubs, no murmurs ABD:  Flat, positive bowel sounds normal in frequency in pitch, no bruits, no rebound, no guarding, no midline pulsatile mass, no hepatomegaly, no splenomegaly EXT:  2 plus pulses throughout, no edema, no cyanosis no clubbing  EKG:  EKG is  done today.  Sinus rhythm, rate 86 with ventricular pacing 100% capture.  Recent Labs: 07/28/2021: ALT 26; BUN 22; Creatinine, Ser 0.79; Hemoglobin 15.6; Platelets 220; Potassium 3.8; Sodium 144; TSH 2.880    Lipid Panel    Component Value Date/Time   CHOL 146 08/10/2020 0903   TRIG 85 08/10/2020 0903   HDL 42  08/10/2020 0903   CHOLHDL 3.5 08/10/2020 0903   CHOLHDL 4.6 CALC 01/17/2008 1250   VLDL 27 01/17/2008 1250   LDLCALC 88 08/10/2020 0903      Wt Readings from Last 3 Encounters:  09/07/21 172 lb 6.4 oz (78.2 kg)  07/28/21 171 lb 9.6 oz (77.8 kg)  04/20/21 156 lb 1.6 oz (70.8 kg)      Other studies Reviewed: Additional studies/ records that were reviewed today include:  CT Review of the above records demonstrates:  Please see elsewhere in the note.     ASSESSMENT AND PLAN:  CHEST PAIN:   She previously had a negative POET (Plain Old Exercise Treadmill).  She had no coronary disease on CT.  No further work-up.  CHB: She is up-to-date with follow-up on her device.  No change in therapy.  HTN: Her blood pressure is at target.    PALPITATIONS: I am going to increase her metoprolol to 50 mg XL daily.  VSD: There was a questionable small VSD on the CT but this would not be of any clinical significance and no change in therapy would be indicated.  Current medicines are reviewed at length with the patient today.  The patient does not have concerns regarding medicines.  The following changes have been made:  None  Labs/ tests ordered today include: None  Orders Placed This Encounter  Procedures   EKG 12-Lead      Disposition:   FU with in one year.     Signed, Minus Breeding, MD  09/07/2021 8:42 PM    [facility]

## 2021-09-07 ENCOUNTER — Ambulatory Visit: Payer: 59 | Admitting: Cardiology

## 2021-09-07 ENCOUNTER — Other Ambulatory Visit (HOSPITAL_COMMUNITY): Payer: Self-pay

## 2021-09-07 ENCOUNTER — Other Ambulatory Visit: Payer: Self-pay

## 2021-09-07 ENCOUNTER — Encounter: Payer: Self-pay | Admitting: Cardiology

## 2021-09-07 VITALS — BP 122/80 | HR 86 | Ht 66.5 in | Wt 172.4 lb

## 2021-09-07 DIAGNOSIS — R072 Precordial pain: Secondary | ICD-10-CM

## 2021-09-07 DIAGNOSIS — I442 Atrioventricular block, complete: Secondary | ICD-10-CM | POA: Diagnosis not present

## 2021-09-07 DIAGNOSIS — E118 Type 2 diabetes mellitus with unspecified complications: Secondary | ICD-10-CM

## 2021-09-07 DIAGNOSIS — I10 Essential (primary) hypertension: Secondary | ICD-10-CM

## 2021-09-07 MED ORDER — METOPROLOL SUCCINATE ER 50 MG PO TB24
50.0000 mg | ORAL_TABLET | Freq: Every day | ORAL | 3 refills | Status: DC
Start: 2021-09-07 — End: 2022-09-19
  Filled 2021-09-07: qty 90, 90d supply, fill #0
  Filled 2021-12-27: qty 90, 90d supply, fill #1
  Filled 2022-03-22: qty 90, 90d supply, fill #2
  Filled 2022-06-15: qty 90, 90d supply, fill #3

## 2021-09-07 NOTE — Patient Instructions (Signed)
Medication Instructions:  Increase metoprolol succinate to 50 mg each day.  *If you need a refill on your cardiac medications before your next appointment, please call your pharmacy*   Follow-Up: At [facility], you and your health needs are our priority.  As part of our continuing mission to provide you with exceptional heart care, we have created designated Provider Care Teams.  These Care Teams include your primary Cardiologist (physician) and Advanced Practice Providers (APPs -  Physician Assistants and Nurse Practitioners) who all work together to provide you with the care you need, when you need it.  We recommend signing up for the patient portal called "MyChart".  Sign up information is provided on this After Visit Summary.  MyChart is used to connect with patients for Virtual Visits (Telemedicine).  Patients are able to view lab/test results, encounter notes, upcoming appointments, etc.  Non-urgent messages can be sent to your provider as well.   To learn more about what you can do with MyChart, go to [email or web address].    Your next appointment:   12 month(s)  The format for your next appointment:   In Person  Provider:   Minus Breeding, MD

## 2021-09-14 ENCOUNTER — Other Ambulatory Visit (HOSPITAL_COMMUNITY): Payer: Self-pay

## 2021-09-15 ENCOUNTER — Other Ambulatory Visit: Payer: Self-pay

## 2021-09-15 ENCOUNTER — Encounter: Payer: Self-pay | Admitting: Podiatry

## 2021-09-15 ENCOUNTER — Ambulatory Visit: Payer: 59 | Admitting: Podiatry

## 2021-09-15 DIAGNOSIS — L602 Onychogryphosis: Secondary | ICD-10-CM

## 2021-09-15 DIAGNOSIS — E118 Type 2 diabetes mellitus with unspecified complications: Secondary | ICD-10-CM | POA: Diagnosis not present

## 2021-09-15 DIAGNOSIS — G35 Multiple sclerosis: Secondary | ICD-10-CM | POA: Diagnosis not present

## 2021-09-16 ENCOUNTER — Encounter: Payer: Self-pay | Admitting: Neurology

## 2021-09-16 ENCOUNTER — Other Ambulatory Visit (HOSPITAL_COMMUNITY): Payer: Self-pay

## 2021-09-16 ENCOUNTER — Ambulatory Visit: Payer: 59 | Admitting: Neurology

## 2021-09-16 VITALS — BP 130/72 | HR 88 | Ht 66.5 in | Wt 174.0 lb

## 2021-09-16 DIAGNOSIS — R3915 Urgency of urination: Secondary | ICD-10-CM

## 2021-09-16 DIAGNOSIS — R4189 Other symptoms and signs involving cognitive functions and awareness: Secondary | ICD-10-CM

## 2021-09-16 DIAGNOSIS — R269 Unspecified abnormalities of gait and mobility: Secondary | ICD-10-CM

## 2021-09-16 DIAGNOSIS — G35 Multiple sclerosis: Secondary | ICD-10-CM | POA: Diagnosis not present

## 2021-09-16 DIAGNOSIS — E119 Type 2 diabetes mellitus without complications: Secondary | ICD-10-CM | POA: Diagnosis not present

## 2021-09-16 DIAGNOSIS — R5383 Other fatigue: Secondary | ICD-10-CM | POA: Diagnosis not present

## 2021-09-16 DIAGNOSIS — G43009 Migraine without aura, not intractable, without status migrainosus: Secondary | ICD-10-CM | POA: Diagnosis not present

## 2021-09-16 NOTE — Progress Notes (Addendum)
[facility]  PATIENT: Katherine Yu DOB: 10/25/1950  REFERRING DOCTOR OR PCP: Claris Gower (PCP) SOURCE: Patient, notes from Dr. Jannifer Franklin, imaging and lab reports, MRI images personally reviewed.  _________________________________   HISTORICAL  CHIEF COMPLAINT:  Chief Complaint  Patient presents with   Follow-up    RM 2, alone. Here for 6 month MS f/u, on DF. Pt had a pacemake placed on 04/19/21. Pt will be changing insurance to medicare healthteam advantage and may need to change MS. MS is stable per pt. Continues to still have balance, cognitive and bladder issues.     HISTORY OF PRESENT ILLNESS:  At the pleasure of seeing your patient, Katherine Yu, at the [facility] [place].  She is transitioning care from Dr. Jannifer Franklin who has recently retired to me.  She is a 71 year old woman who was diagnosed with MS in 2004.  She has no recent exacerbations.  She is on Tecfidera and tolerates it well most days.    She has some flushing depsite taking after meals.  Flushing is much more likely after the morning dose tan the evening dose.    She has taken bASA for the flushing.     Currently, her main problems are related to short term memory/cognitive, gait disturbance and urinary urgency  She notes gait is off balance.  She has had some falls but no injuries...  She denies weakness or numbness.   She notes right le is slightly more clumsy than the left.   She stumbles with rare falls.     She denies diplopia or optic neuritis.    She is seeing an incontinence specialist (Dr. Zigmund Daniel, urogynecology,  [facility]) but is not on any medication.   She denies hesitancy but does not empty and often beeds [place] back a couple times.    She notes some fatigue.  She has insomnia associated with nocturia.  She has noted reduced focus/attention and reduced short-term memory.  This has not changed much over the past couple of years.  Vascular risks:  Does not  some.   She was diagnosed with Type 2 NIDDM in 2022 but had elevated HgBA1c for a few years.  She is doing diet control with HgbA1c 6.4 when checked a month ago.    She has controlled essential hypertension.   She does not have hyperlipidemia.     MS History: She was diagnosed with MS in 2004  She had intense headaches and had an MRI and stumbling     She was placed on Avonex but had some injection reactions,   She switched to Betaseron and did better.   She then switched to REbif (unclear why).   She was on Tysabri for several years.  Due to elevated JCV, she switched to one of her previous injectable medication sand then to glatiramer and then more recently to DMF.  She was switched due to more progression.  Specifically, she was having bladder dysfunction and memory issues and stumbling.     IMAGING: Brain MRI 01/30/2021 shows extensive single and confluent T2/FLAIR hyperintense foci in the hemispheres predominantly in the subcortical and deep white matter with large confluencies in the periatrial white matter and over the frontal horns.  Some foci are in the corpus callosum though but more are pericallosal.  There are no lesions in the infratentorial white matter.  Normal enhancement pattern.  Cerv/thoracic spine MRI shows a normal spinal cord.  Specifically.  There were no demyelinating plaques  noted.  She does have multilevel degenerative changes but no significant spinal stenosis.  REVIEW OF SYSTEMS: Constitutional: No fevers, chills, sweats, or change in appetite Eyes: No visual changes, double vision, eye pain Ear, nose and throat: No hearing loss, ear pain, nasal congestion, sore throat Cardiovascular: No chest pain, palpitations.  She has a pacemaker Respiratory:  No shortness of breath at rest or with exertion.   No wheezes GastrointestinaI: No nausea, vomiting, diarrhea, abdominal pain, fecal incontinence Genitourinary:  she has urinary frequency, retention and nocturia.    Musculoskeletal:  Notes neck pain, and back pain Integumentary: No rash,, skin lesions.  Notes itching Neurological: as above Psychiatric: No depression at this time.  No anxiety Endocrine: No palpitations, diaphoresis, change in appetite, change in weigh or increased thirst Hematologic/Lymphatic:  No anemia, purpura, petechiae. Allergic/Immunologic: No itchy/runny eyes, nasal congestion, recent allergic reactions, rashes  ALLERGIES: Allergies  Allergen Reactions   Niacin Anaphylaxis, Hives, Itching and Rash    HOME MEDICATIONS:  Current Outpatient Medications:    aspirin 325 MG tablet, Take 325 mg by mouth every 6 (six) hours as needed for mild pain or headache., Disp: , Rfl:    benazepril (LOTENSIN) 40 MG tablet, TAKE 1 TABLET BY MOUTH ONCE DAILY., Disp: 90 tablet, Rfl: 1   Cholecalciferol (VITAMIN D3) 5000 UNITS TABS, Take 5,000 Units by mouth daily., Disp: , Rfl:    Dimethyl Fumarate (TECFIDERA) 240 MG CPDR, Take 1 capsule by mouth 2 times daily. (Start after completing 120mg  twice daily  for 2 weeks.), Disp: 60 capsule, Rfl: 3   eletriptan (RELPAX) 40 MG tablet, TAKE 1 TABLET (40 MG TOTAL) BY MOUTH AS NEEDED FOR MIGRAINE. MAY REPEAT IN 2 HOURS IF HEADACHE PERSISTS OR RECURS., Disp: 24 tablet, Rfl: 5   famotidine (PEPCID) 20 MG tablet, Take 20 mg by mouth daily as needed for heartburn or indigestion., Disp: , Rfl:    hydrochlorothiazide (HYDRODIURIL) 50 MG tablet, Take 1/2 tablet by mouth at bedtime., Disp: 45 tablet, Rfl: 0   hydrOXYzine (ATARAX) 25 MG tablet, Take 1 tablet (25 mg total) by mouth every 6 (six) hours as needed for itching., Disp: 30 tablet, Rfl: 5   ibuprofen (ADVIL) 200 MG tablet, Take 400 mg by mouth every 6 (six) hours as needed for mild pain., Disp: , Rfl:    meclizine (ANTIVERT) 25 MG tablet, Take 25 mg by mouth 3 (three) times daily as needed for dizziness., Disp: , Rfl:    metoprolol succinate (TOPROL-XL) 50 MG 24 hr tablet, Take 1 tablet (50 mg total) by  mouth daily. Take with or immediately following a meal., Disp: 90 tablet, Rfl: 3   ondansetron (ZOFRAN-ODT) 8 MG disintegrating tablet, Dissolve 1 tablet (8 mg total) by mouth every 8 (eight) hours as needed for nausea., Disp: 60 tablet, Rfl: 12   rosuvastatin (CRESTOR) 5 MG tablet, Take 1 tablet by mouth daily for cholesterol., Disp: 90 tablet, Rfl: 3  PAST MEDICAL HISTORY: Past Medical History:  Diagnosis Date   ARTHRITIS 02/20/2009   Qualifier: Diagnosis of  By: Ronne Binning     CHEST PAIN-PRECORDIAL 03/02/2009   Qualifier: Diagnosis of  By: Verl Blalock, MD, Estevan Oaks C    Chronic insomnia    Degenerative arthritis    DEPRESSION 02/20/2009   Qualifier: Diagnosis of  By: Orville Govern, CMA, Carol     Dyslipidemia    FATIGUE 02/20/2009   Qualifier: Diagnosis of  By: Ronne Binning     GERD 02/20/2009   Qualifier: Diagnosis  of  By: Orville Govern, CMA, Carol     History of lower leg fracture    History of peptic ulcer    History of seizures    In childhood   HYPERLIPIDEMIA-MIXED 05/04/2010   Qualifier: Diagnosis of  By: Verl Blalock, MD, Estevan Oaks C    Hypertension    Migraine    Mild obesity    MULTIPLE SCLEROSIS 02/20/2009   Qualifier: Diagnosis of  By: Orville Govern, CMA, Carol     Plantar fasciitis    Temporomandibular joint disease    Urinary incontinence     PAST SURGICAL HISTORY: Past Surgical History:  Procedure Laterality Date   APPENDECTOMY     GALLBLADDER SURGERY     metal plate resection     skull   PACEMAKER IMPLANT N/A 04/19/2021   Procedure: PACEMAKER IMPLANT;  Surgeon: Vickie Epley, MD;  Location: [facility];  Service: Cardiovascular;  Laterality: N/A;   pilonidal cyst      FAMILY HISTORY: Family History  Adopted: Yes  Problem Relation Age of Onset   Lupus Sister    Cancer Sister    Heart attack Sister    Heart attack Sister    Breast cancer Neg Hx     SOCIAL HISTORY:   Social History   Socioeconomic History   Marital status: Married    Spouse name:  Mikeal Hawthorne   Number of children: 3   Years of education: Masters   Highest education level: Not on file  Occupational History   Occupation: Equities trader  Tobacco Use   Smoking status: Never   Smokeless tobacco: Never  Substance and Sexual Activity   Alcohol use: No   Drug use: No   Sexual activity: Not Currently  Other Topics Concern   Not on file  Social History Narrative   Patient is right handed.   Patient drinks 1 cup caffeine daily.   Lives with husband and son.    Social Determinants of Health   Financial Resource Strain: Low Risk    Difficulty of Paying Living Expenses: Not hard at all  Food Insecurity: Not on file  Transportation Needs: No Transportation Needs   Lack of Transportation (Medical): No   Lack of Transportation (Non-Medical): No  Physical Activity: Not on file  Stress: Not on file  Social Connections: Not on file  Intimate Partner Violence: Unknown   Fear of Current or Ex-Partner: No   Emotionally Abused: Not on file   Physically Abused: Not on file   Sexually Abused: Not on file    Addendum: No partner violence.  No unmet transportation need.  Has gotten lost driving and does not drive at night  PHYSICAL EXAM  Vitals:   09/16/21 0822  BP: 130/72  Pulse: 88  SpO2: 97%  Weight: 174 lb (78.9 kg)  Height: 5' 6.5" (1.689 m)    Body mass index is 27.66 kg/m.   General: The patient is well-developed and well-nourished and in no acute distress  HEENT:  Head is [place]/AT.  Sclera are anicteric.  Color vision was symmetric.  Neck: No carotid bruits are noted.  The neck is nontender.  Cardiovascular: The heart has a regular rate and rhythm with a normal S1 and S2. There were no murmurs, gallops or rubs.    Skin: Extremities are without rash or  edema.  Musculoskeletal:  Back is nontender  Neurologic Exam  Mental status: The patient is alert and oriented x 3 at the time of the examination. The patient has  apparent normal recent and remote  memory, with an apparently normal attention span and concentration ability.   Speech is normal.  Cranial nerves: Extraocular movements are full. Pupils are equal, round, and reactive to light and accomodation.  Facial strength and sensation was normal.  No dysarthria.  The tongue is midline, and the patient has symmetric elevation of the soft palate. No obvious hearing deficits are noted.  Motor:  Muscle bulk is normal.   Tone is normal. Strength is  5 / 5 in all 4 extremities.   Sensory: Sensory testing is intact to pinprick, soft touch and vibration sensation in all 4 extremities except mild reduced vibration sensation at toes  Coordination: Cerebellar testing reveals good finger-nose-finger and mildly reduced heel-to-shin bilaterally.  Gait and station: Station is normal.  Her gait is mildly wide but she can walk without a cane.  Tandem is poor.  Romberg is negative.   Reflexes: Deep tendon reflexes are symmetric and normal in arms, 3+ at the knees.  No ankle clonus.   Plantar responses are flexor.    DIAGNOSTIC DATA (LABS, IMAGING, TESTING) - I reviewed patient records, labs, notes, testing and imaging myself where available.  Lab Results  Component Value Date   WBC 4.5 07/28/2021   HGB 15.6 07/28/2021   HCT 43.4 07/28/2021   MCV 87 07/28/2021   PLT 220 07/28/2021      Component Value Date/Time   NA 144 07/28/2021 1426   K 3.8 07/28/2021 1426   CL 102 07/28/2021 1426   CO2 27 07/28/2021 1426   GLUCOSE 113 (H) 07/28/2021 1426   GLUCOSE 111 (H) 04/19/2021 1130   BUN 22 07/28/2021 1426   CREATININE 0.79 07/28/2021 1426   CALCIUM 9.7 07/28/2021 1426   PROT 7.3 07/28/2021 1426   ALBUMIN 4.9 (H) 07/28/2021 1426   AST 23 07/28/2021 1426   ALT 26 07/28/2021 1426   ALKPHOS 71 07/28/2021 1426   BILITOT <0.2 07/28/2021 1426   GFRNONAA 58 (L) 04/19/2021 1130   GFRAA 73 10/31/2019 1107   Lab Results  Component Value Date   CHOL 146 08/10/2020   HDL 42 08/10/2020   LDLCALC 88  08/10/2020   TRIG 85 08/10/2020   CHOLHDL 3.5 08/10/2020   Lab Results  Component Value Date   HGBA1C 5.8 (H) 01/26/2021   No results found for: VITAMINB12 Lab Results  Component Value Date   TSH 2.880 07/28/2021       ASSESSMENT AND PLAN  MULTIPLE SCLEROSIS  Migraine without aura and without status migrainosus, not intractable  Gait disorder  Type 2 diabetes mellitus without complication, without long-term current use of insulin (HCC)  Other fatigue  Urinary urgency  Cognitive deficits    She was diagnosed in 2004 with MS based on the abnormal brain MRI.  She has not had any definite exacerbations though she has had some decline including reduced gait, bladder dysfunction and attention/memory issues.  The MRI of the brain shows extensive white matter changes.  The majority of the foci are nonspecific and many of the foci could be due to to chronic microvascular ischemic change.  I discussed with her that multiple sclerosis usually becomes less aggressive with age and duration of diagnosis and that many patients in their 73s are able to stop disease modifying therapy.  Since she has been stable with no recent exacerbation we will stop her Tecfidera and follow annual MRIs for several years.  If she does have significant new activity she can always go  back on a disease modifying therapy. Factor and exercise as tolerated. Continue Relpax for migraine. Return in 6 months or sooner if there are new or worsening neurologic symptoms.  44-minute office visit with the majority of the time spent face-to-face for history and physical, discussion/counseling and decision-making.  Additional time with record review and documentation.   Micaiah Remillard A. Felecia Shelling, MD, [facility] 4/65/2076, 1:91 PM Certified in Neurology, Clinical Neurophysiology, Sleep Medicine and Neuroimaging  [facility] [address] [place], [place] [address] [phone]

## 2021-09-16 NOTE — Progress Notes (Addendum)
Gave completed/signed parking placard form back to medical records to process for pt.

## 2021-09-19 NOTE — Progress Notes (Signed)
°  Subjective:  Patient ID: Katherine Yu, female    DOB: 05-01-1951,  MRN: [id number]  Katherine Yu presents to clinic today for preventative diabetic foot care and follow up elongated toenails b/l feet.She also has h/o MS  Patient is not required to monitor blood glucose daily.  PCP is Leonard Downing, MD , and last visit was 04/22/2021.  Allergies  Allergen Reactions   Niacin Anaphylaxis, Hives, Itching and Rash    Review of Systems: Negative except as noted in the HPI. Objective:   Constitutional Katherine Yu is a pleasant 71 y.o. Caucasian female, WD, WN in NAD. AAO x 3.   Vascular CFT immediate b/l LE. Palpable DP/PT pulses b/l LE. Digital hair sparse b/l. Skin temperature gradient WNL b/l. No pain with calf compression b/l. No edema noted b/l. No cyanosis or clubbing noted b/l LE.  Neurologic Normal speech. Oriented to person, place, and time. Pt has subjective symptoms of neuropathy. Protective sensation intact 5/5 intact bilaterally with 10g monofilament b/l. Vibratory sensation intact b/l. Proprioception intact bilaterally.  Dermatologic Pedal integument with normal turgor, texture and tone BLE. No open wounds b/l LE. No interdigital macerations noted b/l LE. Nondystrophic toenails 1-5 bilaterally.  Orthopedic: Muscle strength 5/5 to all lower extremity muscle groups bilaterally.   Radiographs: None  Last A1c:  Hemoglobin A1C Latest Ref Rng & Units 01/26/2021  HGBA1C 4.8 - 5.6 % 5.8(H)  Some recent data might be hidden   Assessment:   1. Overgrown toenails   2. MULTIPLE SCLEROSIS   3. Type 2 diabetes mellitus with complication, without long-term current use of insulin ([place])    Plan:  Patient was evaluated and treated and all questions answered. Consent given for treatment as described below: -No new findings. No new orders. -Nondystrophic toenails trimmed 1-5 bilaterally. -Patient/POA to call should there be question/concern in the interim.  Return in about 3  months (around 12/14/2021).  Marzetta Board, DPM

## 2021-09-24 ENCOUNTER — Encounter: Payer: Self-pay | Admitting: Neurology

## 2021-09-27 ENCOUNTER — Encounter: Payer: Self-pay | Admitting: Neurology

## 2021-10-06 ENCOUNTER — Other Ambulatory Visit (HOSPITAL_COMMUNITY): Payer: Self-pay

## 2021-10-12 ENCOUNTER — Other Ambulatory Visit (HOSPITAL_COMMUNITY): Payer: Self-pay

## 2021-10-12 ENCOUNTER — Ambulatory Visit: Payer: 59 | Admitting: Podiatry

## 2021-10-12 ENCOUNTER — Other Ambulatory Visit: Payer: Self-pay

## 2021-10-12 DIAGNOSIS — M722 Plantar fascial fibromatosis: Secondary | ICD-10-CM | POA: Diagnosis not present

## 2021-10-12 DIAGNOSIS — M7742 Metatarsalgia, left foot: Secondary | ICD-10-CM | POA: Diagnosis not present

## 2021-10-12 DIAGNOSIS — G5782 Other specified mononeuropathies of left lower limb: Secondary | ICD-10-CM

## 2021-10-12 DIAGNOSIS — G5792 Unspecified mononeuropathy of left lower limb: Secondary | ICD-10-CM

## 2021-10-12 NOTE — Progress Notes (Signed)
°  Subjective:  Patient ID: Katherine Yu, female    DOB: Nov 30, 1950,  MRN: [id number]  Chief Complaint  Patient presents with   Plantar Fasciitis    Right foot    71 y.o. female presents with the above complaint. History confirmed with patient.  She has had ongoing heel pain in the lateral heel that has improved.  Her plantar fasciitis is much better.  She also has aching pain in the top of the left foot as well as in the ball of the left foot as well.  Objective:  Physical Exam: warm, good capillary refill, no trophic changes or ulcerative lesions, normal DP and PT pulses, normal sensory exam, and mild pain plantar lateral heel, no pain in the plantar fascia or insertion on the right side, left side mild tenderness in the third interspace and the dorsal cutaneous nerve. Assessment:   1. Neuritis of left foot   2. Plantar fasciitis, right   3. Metatarsalgia, left foot   4. Interdigital neuroma of left foot      Plan:  Patient was evaluated and treated and all questions answered.  She had multiple issues all of which seem to be improving.  Her heel pain I think is likely related to her plantar fasciitis and I gave her exercises for this to continue and resume if this ever recurs or restarts.  For the left side and she has a small neuroma and I dispensed metatarsal pads and apply these to her power step orthotics today to see if this alleviate pain and pressure.  We also discussed cortisone injection for this if it does not improve but she has had bad reactions to these before.  I think the pain on the top of the foot the left foot is likely compression neuritis from shoe gear and we reviewed alternate lacing patterns and she will try this.  Also discussed topical medication such as lidocaine ointment if this does not improve.  She return to see me on an as-needed basis.  Return if symptoms worsen or fail to improve.

## 2021-10-12 NOTE — Patient Instructions (Signed)

## 2021-10-25 ENCOUNTER — Ambulatory Visit (INDEPENDENT_AMBULATORY_CARE_PROVIDER_SITE_OTHER): Payer: 59

## 2021-10-25 DIAGNOSIS — I442 Atrioventricular block, complete: Secondary | ICD-10-CM | POA: Diagnosis not present

## 2021-10-25 LAB — CUP PACEART REMOTE DEVICE CHECK
Battery Remaining Longevity: 151 mo
Battery Voltage: 3.15 V
Brady Statistic AP VP Percent: 0.11 %
Brady Statistic AP VS Percent: 0 %
Brady Statistic AS VP Percent: 99.87 %
Brady Statistic AS VS Percent: 0.02 %
Brady Statistic RA Percent Paced: 0.12 %
Brady Statistic RV Percent Paced: 99.98 %
Date Time Interrogation Session: 20230219223920
Implantable Lead Implant Date: 20220815
Implantable Lead Implant Date: 20220815
Implantable Lead Location: 753859
Implantable Lead Location: 753860
Implantable Lead Model: 3830
Implantable Lead Model: 5076
Implantable Pulse Generator Implant Date: 20220815
Lead Channel Impedance Value: 342 Ohm
Lead Channel Impedance Value: 380 Ohm
Lead Channel Impedance Value: 418 Ohm
Lead Channel Impedance Value: 665 Ohm
Lead Channel Pacing Threshold Amplitude: 0.625 V
Lead Channel Pacing Threshold Amplitude: 1 V
Lead Channel Pacing Threshold Pulse Width: 0.4 ms
Lead Channel Pacing Threshold Pulse Width: 0.4 ms
Lead Channel Sensing Intrinsic Amplitude: 11.5 mV
Lead Channel Sensing Intrinsic Amplitude: 3.5 mV
Lead Channel Sensing Intrinsic Amplitude: 3.5 mV
Lead Channel Setting Pacing Amplitude: 1.5 V
Lead Channel Setting Pacing Amplitude: 2 V
Lead Channel Setting Pacing Pulse Width: 0.4 ms
Lead Channel Setting Sensing Sensitivity: 0.9 mV

## 2021-10-29 ENCOUNTER — Other Ambulatory Visit: Payer: Self-pay

## 2021-10-29 ENCOUNTER — Encounter: Payer: Self-pay | Admitting: Family Medicine

## 2021-10-29 ENCOUNTER — Ambulatory Visit: Payer: 59 | Admitting: Family Medicine

## 2021-10-29 VITALS — BP 128/82 | HR 96 | Temp 98.4°F | Ht 65.5 in | Wt 175.0 lb

## 2021-10-29 DIAGNOSIS — N3941 Urge incontinence: Secondary | ICD-10-CM | POA: Diagnosis not present

## 2021-10-29 DIAGNOSIS — G35 Multiple sclerosis: Secondary | ICD-10-CM | POA: Diagnosis not present

## 2021-10-29 DIAGNOSIS — I442 Atrioventricular block, complete: Secondary | ICD-10-CM

## 2021-10-29 DIAGNOSIS — Z9109 Other allergy status, other than to drugs and biological substances: Secondary | ICD-10-CM | POA: Diagnosis not present

## 2021-10-29 DIAGNOSIS — E118 Type 2 diabetes mellitus with unspecified complications: Secondary | ICD-10-CM

## 2021-10-29 DIAGNOSIS — I10 Essential (primary) hypertension: Secondary | ICD-10-CM | POA: Diagnosis not present

## 2021-10-29 DIAGNOSIS — K219 Gastro-esophageal reflux disease without esophagitis: Secondary | ICD-10-CM | POA: Diagnosis not present

## 2021-10-29 LAB — POCT GLYCOSYLATED HEMOGLOBIN (HGB A1C): Hemoglobin A1C: 5.8 % — AB (ref 4.0–5.6)

## 2021-10-29 NOTE — Assessment & Plan Note (Addendum)
Seeing Dr. Kerman Passey.  Notes biggest impact is on memory but she is stable and doing well appreciate neurology support

## 2021-10-29 NOTE — Assessment & Plan Note (Addendum)
Lab Results  Component Value Date   HGBA1C 5.8 (A) 10/29/2021   Well-controlled with diet and exercise.  Continue with every 7-month monitoring.

## 2021-10-29 NOTE — Assessment & Plan Note (Signed)
Follows with Dr. Zigmund Daniel with urogyn. Wearing underwear, rare accidents

## 2021-10-29 NOTE — Assessment & Plan Note (Signed)
Controlled.  Continue benazepril 40 mg, hydrochlorothiazide 50 mg, metoprolol 50 mg.

## 2021-10-29 NOTE — Progress Notes (Signed)
Subjective:     Katherine Yu is a 71 y.o. female presenting for Establish Care     HPI  #Diabetes - working on diet controled - was on a 1500 calorie diet and lost to 147 but regained   #Hx of depression - feels she is doing well - does not feel she needs anything at this time  Review of Systems   Social History   Tobacco Use  Smoking Status Never  Smokeless Tobacco Never        Objective:    BP Readings from Last 3 Encounters:  10/29/21 128/82  09/16/21 130/72  09/07/21 122/80   Wt Readings from Last 3 Encounters:  10/29/21 175 lb (79.4 kg)  09/16/21 174 lb (78.9 kg)  09/07/21 172 lb 6.4 oz (78.2 kg)    BP 128/82    Pulse 96    Temp 98.4 F (36.9 C) (Oral)    Ht 5' 5.5" (1.664 m)    Wt 175 lb (79.4 kg)    SpO2 97%    BMI 28.68 kg/m    Physical Exam Constitutional:      General: She is not in acute distress.    Appearance: She is well-developed. She is not diaphoretic.  HENT:     Right Ear: External ear normal.     Left Ear: External ear normal.  Eyes:     Conjunctiva/sclera: Conjunctivae normal.  Cardiovascular:     Rate and Rhythm: Normal rate and regular rhythm.     Heart sounds: No murmur heard. Pulmonary:     Effort: Pulmonary effort is normal. No respiratory distress.     Breath sounds: Normal breath sounds. No wheezing.  Musculoskeletal:     Cervical back: Neck supple.  Skin:    General: Skin is warm and dry.     Capillary Refill: Capillary refill takes less than 2 seconds.  Neurological:     Mental Status: She is alert. Mental status is at baseline.  Psychiatric:        Mood and Affect: Mood normal.        Behavior: Behavior normal.          Assessment & Plan:   Problem List Items Addressed This Visit       Cardiovascular and Mediastinum   Essential hypertension    Controlled.  Continue benazepril 40 mg, hydrochlorothiazide 50 mg, metoprolol 50 mg.      Complete heart block ([place]) - Primary    With a pacemaker.  Stable.         Digestive   GERD    Controlled with famotidine prn        Endocrine   Type 2 diabetes mellitus with complication, without long-term current use of insulin (HCC)    Lab Results  Component Value Date   HGBA1C 5.8 (A) 10/29/2021  Well-controlled with diet and exercise.  Continue with every 68-month monitoring.       Relevant Orders   POCT glycosylated hemoglobin (Hb A1C) (Completed)     Nervous and Auditory   Multiple sclerosis (HCC)    Seeing Dr. Kerman Passey.  Notes biggest impact is on memory but she is stable and doing well appreciate neurology support        Other   Urge incontinence    Follows with Dr. Zigmund Daniel with urogyn. 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  Return in about 6 months (around 04/28/2022) for [facility] to [employer] .  Lesleigh Noe, MD  This visit occurred during the SARS-CoV-2 public health emergency.  Safety protocols were in place, including screening questions prior to the visit, additional usage of staff PPE, and extensive cleaning of exam room while observing appropriate contact time as indicated for disinfecting solutions.

## 2021-10-29 NOTE — Progress Notes (Signed)
Remote pacemaker transmission.   

## 2021-10-29 NOTE — Assessment & Plan Note (Signed)
She notes history of severe reactions despite avoidance in the summertime.

## 2021-10-29 NOTE — Assessment & Plan Note (Signed)
With a pacemaker. Stable.

## 2021-10-29 NOTE — Patient Instructions (Addendum)
-   Continue medications - return for wellness in the summertime

## 2021-10-29 NOTE — Assessment & Plan Note (Signed)
Controlled with famotidine prn

## 2021-11-01 DIAGNOSIS — N393 Stress incontinence (female) (male): Secondary | ICD-10-CM | POA: Diagnosis not present

## 2021-11-16 ENCOUNTER — Other Ambulatory Visit: Payer: Self-pay | Admitting: Family Medicine

## 2021-11-16 ENCOUNTER — Other Ambulatory Visit (HOSPITAL_COMMUNITY): Payer: Self-pay

## 2021-11-16 MED ORDER — HYDROCHLOROTHIAZIDE 25 MG PO TABS
25.0000 mg | ORAL_TABLET | Freq: Every day | ORAL | 1 refills | Status: DC
  Filled 2021-11-16: qty 90, 90d supply, fill #0
  Filled 2022-02-14: qty 90, 90d supply, fill #1

## 2021-11-16 NOTE — Telephone Encounter (Signed)
My chart message sent to pt.

## 2021-11-17 ENCOUNTER — Other Ambulatory Visit (HOSPITAL_COMMUNITY): Payer: Self-pay

## 2021-11-18 DIAGNOSIS — G35 Multiple sclerosis: Secondary | ICD-10-CM | POA: Diagnosis not present

## 2021-11-18 DIAGNOSIS — N3946 Mixed incontinence: Secondary | ICD-10-CM | POA: Diagnosis not present

## 2021-11-27 ENCOUNTER — Other Ambulatory Visit (HOSPITAL_COMMUNITY): Payer: Self-pay

## 2021-12-03 ENCOUNTER — Other Ambulatory Visit (HOSPITAL_COMMUNITY): Payer: Self-pay

## 2021-12-06 ENCOUNTER — Ambulatory Visit: Payer: 59 | Admitting: Podiatry

## 2021-12-06 ENCOUNTER — Encounter: Payer: Self-pay | Admitting: Podiatry

## 2021-12-06 DIAGNOSIS — G35 Multiple sclerosis: Secondary | ICD-10-CM | POA: Diagnosis not present

## 2021-12-06 DIAGNOSIS — L602 Onychogryphosis: Secondary | ICD-10-CM | POA: Diagnosis not present

## 2021-12-06 DIAGNOSIS — E118 Type 2 diabetes mellitus with unspecified complications: Secondary | ICD-10-CM | POA: Diagnosis not present

## 2021-12-06 DIAGNOSIS — Q828 Other specified congenital malformations of skin: Secondary | ICD-10-CM | POA: Diagnosis not present

## 2021-12-11 NOTE — Progress Notes (Signed)
?  Subjective:  ?Patient ID: Katherine Yu, female    DOB: [birth year],  MRN: [id number] ? ?Katherine Yu presents to clinic today for at risk foot care. Patient has history of diabetes and multiple sclerosis. She is seen for f/u of overgrown toenails which are relieved with periodic professional debridement.  She did follow up with Dr. Sherryle Lis for neuritis and was treated with padding. She relates episode of redness of left 2nd toe after her visit. She did not treat it and states it resolved. ? ?PCP is Lesleigh Noe, MD , and last visit was October 29, 2021. ? ?Allergies  ?Allergen Reactions  ? Niacin Anaphylaxis, Hives, Itching and Rash  ? ? ?Review of Systems: Negative except as noted in the HPI. ? ?Objective: ?Constitutional Katherine Yu is a pleasant 71 y.o. Caucasian female, WD, WN in NAD. AAO x 3.   ?Vascular CFT immediate b/l LE. Palpable DP/PT pulses b/l LE. Digital hair sparse b/l. Skin temperature gradient WNL b/l. No pain with calf compression b/l. No edema noted b/l. No cyanosis or clubbing noted b/l LE.  ?Neurologic Normal speech. Oriented to person, place, and time. Pt has subjective symptoms of neuropathy. Protective sensation intact 5/5 intact bilaterally with 10g monofilament b/l. Vibratory sensation intact b/l. Proprioception intact bilaterally.  ?Dermatologic Pedal integument with normal turgor, texture and tone BLE. No open wounds b/l LE. No interdigital macerations noted b/l LE. Nondystrophic toenails 1-5 bilaterally.Very small porokeratotic lesion noted medial aspect left 2nd digit. No erythema, no edema, no drainage, no fluctuance.  ?Orthopedic: Muscle strength 5/5 to all lower extremity muscle groups bilaterally.  ? ?Radiographs: None ? ?  Latest Ref Rng & Units 10/29/2021  ?  8:45 AM 01/26/2021  ?  8:45 AM  ?Hemoglobin A1C  ?Hemoglobin-A1c 4.0 - 5.6 % 5.8   5.8    ? ?Assessment/Plan: ?1. Overgrown toenails   ?2. Porokeratosis   ?3. MULTIPLE SCLEROSIS   ?4. Type 2 diabetes mellitus with  complication, without long-term current use of insulin ([place])   ?  ?-Patient was evaluated and treated. All patient's and/or POA's questions/concerns answered on today's visit. ?-Nondystrophic toenails trimmed 1-5 bilaterally. ?-Painful porokeratotic lesion(s) L 2nd toe pared and enucleated with sterile scalpel blade without incident. Total number of lesions debrided=1. ?-Dispensed tube foam. Apply to L 2nd toe every morning. Remove every evening. ?-Patient/POA to call should there be question/concern in the interim.  ? ?Return in about 3 months (around 03/07/2022). ? ?Marzetta Board, DPM  ?

## 2021-12-20 ENCOUNTER — Ambulatory Visit: Payer: 59 | Admitting: Podiatry

## 2021-12-27 ENCOUNTER — Other Ambulatory Visit (HOSPITAL_COMMUNITY): Payer: Self-pay

## 2021-12-30 ENCOUNTER — Other Ambulatory Visit: Payer: Self-pay | Admitting: Neurology

## 2022-01-03 ENCOUNTER — Other Ambulatory Visit (HOSPITAL_COMMUNITY): Payer: Self-pay

## 2022-01-03 MED ORDER — HYDROXYZINE HCL 25 MG PO TABS
25.0000 mg | ORAL_TABLET | Freq: Four times a day (QID) | ORAL | 0 refills | Status: DC | PRN
  Filled 2022-01-03: qty 30, 8d supply, fill #0

## 2022-01-03 NOTE — Telephone Encounter (Signed)
Rx refilled.

## 2022-01-04 ENCOUNTER — Other Ambulatory Visit (HOSPITAL_COMMUNITY): Payer: Self-pay

## 2022-01-05 ENCOUNTER — Other Ambulatory Visit (HOSPITAL_COMMUNITY): Payer: Self-pay

## 2022-01-05 MED ORDER — AMOXICILLIN 500 MG PO CAPS
500.0000 mg | ORAL_CAPSULE | Freq: Three times a day (TID) | ORAL | 0 refills | Status: DC
  Filled 2022-01-05: qty 21, 7d supply, fill #0

## 2022-01-12 ENCOUNTER — Other Ambulatory Visit (HOSPITAL_COMMUNITY): Payer: Self-pay

## 2022-01-12 MED ORDER — AMOXICILLIN 500 MG PO CAPS
ORAL_CAPSULE | ORAL | 0 refills | Status: DC
  Filled 2022-01-12: qty 16, 6d supply, fill #0

## 2022-01-12 MED ORDER — ACETAMINOPHEN-CODEINE #3 300-30 MG PO TABS
ORAL_TABLET | ORAL | 0 refills | Status: DC
  Filled 2022-01-12: qty 12, 3d supply, fill #0

## 2022-01-20 ENCOUNTER — Encounter: Payer: Self-pay | Admitting: Family Medicine

## 2022-01-24 ENCOUNTER — Ambulatory Visit (INDEPENDENT_AMBULATORY_CARE_PROVIDER_SITE_OTHER): Payer: 59

## 2022-01-24 DIAGNOSIS — I442 Atrioventricular block, complete: Secondary | ICD-10-CM

## 2022-01-25 LAB — CUP PACEART REMOTE DEVICE CHECK
Battery Remaining Longevity: 138 mo
Battery Voltage: 3.08 V
Brady Statistic AP VP Percent: 0.34 %
Brady Statistic AP VS Percent: 0 %
Brady Statistic AS VP Percent: 99.19 %
Brady Statistic AS VS Percent: 0.47 %
Brady Statistic RA Percent Paced: 0.42 %
Brady Statistic RV Percent Paced: 99.53 %
Date Time Interrogation Session: 20230521201001
Implantable Lead Implant Date: 20220815
Implantable Lead Implant Date: 20220815
Implantable Lead Location: 753859
Implantable Lead Location: 753860
Implantable Lead Model: 3830
Implantable Lead Model: 5076
Implantable Pulse Generator Implant Date: 20220815
Lead Channel Impedance Value: 361 Ohm
Lead Channel Impedance Value: 399 Ohm
Lead Channel Impedance Value: 532 Ohm
Lead Channel Impedance Value: 665 Ohm
Lead Channel Pacing Threshold Amplitude: 0.75 V
Lead Channel Pacing Threshold Amplitude: 1.125 V
Lead Channel Pacing Threshold Pulse Width: 0.4 ms
Lead Channel Pacing Threshold Pulse Width: 0.4 ms
Lead Channel Sensing Intrinsic Amplitude: 4.875 mV
Lead Channel Sensing Intrinsic Amplitude: 4.875 mV
Lead Channel Sensing Intrinsic Amplitude: 5.5 mV
Lead Channel Sensing Intrinsic Amplitude: 5.5 mV
Lead Channel Setting Pacing Amplitude: 1.5 V
Lead Channel Setting Pacing Amplitude: 2.25 V
Lead Channel Setting Pacing Pulse Width: 0.4 ms
Lead Channel Setting Sensing Sensitivity: 0.9 mV

## 2022-02-10 NOTE — Progress Notes (Signed)
Remote pacemaker transmission.   

## 2022-02-15 ENCOUNTER — Other Ambulatory Visit (HOSPITAL_COMMUNITY): Payer: Self-pay

## 2022-02-22 ENCOUNTER — Other Ambulatory Visit (HOSPITAL_COMMUNITY): Payer: Self-pay

## 2022-02-22 ENCOUNTER — Other Ambulatory Visit: Payer: Self-pay | Admitting: Family Medicine

## 2022-02-22 ENCOUNTER — Encounter: Payer: Self-pay | Admitting: Family Medicine

## 2022-02-22 MED ORDER — BENAZEPRIL HCL 40 MG PO TABS
40.0000 mg | ORAL_TABLET | Freq: Every day | ORAL | 1 refills | Status: DC
  Filled 2022-02-22: qty 90, 90d supply, fill #0
  Filled 2022-05-16: qty 90, 90d supply, fill #1

## 2022-03-14 ENCOUNTER — Ambulatory Visit: Payer: PPO | Admitting: Podiatry

## 2022-03-14 ENCOUNTER — Encounter: Payer: Self-pay | Admitting: Podiatry

## 2022-03-14 DIAGNOSIS — E118 Type 2 diabetes mellitus with unspecified complications: Secondary | ICD-10-CM | POA: Diagnosis not present

## 2022-03-14 DIAGNOSIS — G35 Multiple sclerosis: Secondary | ICD-10-CM | POA: Diagnosis not present

## 2022-03-14 DIAGNOSIS — M792 Neuralgia and neuritis, unspecified: Secondary | ICD-10-CM

## 2022-03-14 DIAGNOSIS — L84 Corns and callosities: Secondary | ICD-10-CM | POA: Diagnosis not present

## 2022-03-14 DIAGNOSIS — Q828 Other specified congenital malformations of skin: Secondary | ICD-10-CM | POA: Diagnosis not present

## 2022-03-14 DIAGNOSIS — L602 Onychogryphosis: Secondary | ICD-10-CM

## 2022-03-14 NOTE — Progress Notes (Signed)
  Subjective:  Patient ID: Katherine Yu, female    DOB: 04/12/1951,  MRN: [id number]  Katherine Yu presents to clinic today for at risk foot care with h/o multiple sclerosis and neuropathy and callus(es) left lower extremity, porokeratotic lesion(s) left lower extremity, and painful elongated toenails.  Painful toenails interfere with ambulation. Aggravating factors include wearing enclosed shoe gear. Pain is relieved with periodic professional debridement. Painful callus(es) and porokeratotic lesion(s) are aggravated when weightbearing with and without shoegear. Pain is relieved with periodic professional debridement.  Last A1c was 6.8%. Patient did not check blood glucose today.  Patient continues to have neuropathic pain in feet.  PCP is Lesleigh Noe, MD , and last visit was  October 29, 2021  Allergies  Allergen Reactions   Niacin Anaphylaxis, Hives, Itching and Rash    Review of Systems: Negative except as noted in the HPI.  Objective: No changes noted in today's physical examination. Constitutional Katherine Yu is a pleasant 71 y.o. Caucasian female, WD, WN in NAD. AAO x 3.   Vascular CFT immediate b/l LE. Palpable DP/PT pulses b/l LE. Digital hair sparse b/l. Skin temperature gradient WNL b/l. No pain with calf compression b/l. No edema noted b/l. No cyanosis or clubbing noted b/l LE.  Neurologic Normal speech. Oriented to person, place, and time. Pt has subjective symptoms of neuropathy. Protective sensation intact 5/5 intact bilaterally with 10g monofilament b/l. Vibratory sensation intact b/l. Proprioception intact bilaterally.  Dermatologic Pedal integument with normal turgor, texture and tone BLE. No open wounds b/l LE. No interdigital macerations noted b/l LE. Nondystrophic toenails 1-5 bilaterally.Very small porokeratotic lesion noted medial aspect left 2nd digit. Hyperkeratotic lesion medial IPJ of left great toe. No erythema, no edema, no drainage, no fluctuance.   Orthopedic: Muscle strength 5/5 to all lower extremity muscle groups bilaterally.   Radiographs: None    Latest Ref Rng & Units 03/17/2022   11:33 AM 10/29/2021    8:45 AM  Hemoglobin A1C  Hemoglobin-A1c 4.8 - 5.6 % 6.0  5.8    Assessment/Plan: 1. Overgrown toenails   2. Neuropathic pain   3. Porokeratosis   4. Callus   5. MULTIPLE SCLEROSIS   6. Type 2 diabetes mellitus with complication, without long-term current use of insulin (HCC)     -For foot pain, purchase Nervive Pain Cream or Roll On. Apply to feet before bedtime. Can be purchased at your local drug store over the counter. -Nondystrophic toenails trimmed 1-5 bilaterally. -Callus(es) medial IPJ of left great toe pared utilizing sterile scalpel blade without complication or incident. Total number debrided =1. -Porokeratotic lesion(s) DIPJ of L 2nd toe pared and enucleated with sterile scalpel blade without incident. Total number of lesions debrided=1. -Patient/POA to call should there be question/concern in the interim.   Return in about 3 months (around 06/14/2022).  Marzetta Board, DPM

## 2022-03-14 NOTE — Patient Instructions (Signed)
Purchase Nervive Pain Cream or Roll On. Apply to feet before bedtime.

## 2022-03-17 ENCOUNTER — Ambulatory Visit (INDEPENDENT_AMBULATORY_CARE_PROVIDER_SITE_OTHER): Payer: PPO | Admitting: Neurology

## 2022-03-17 ENCOUNTER — Encounter: Payer: Self-pay | Admitting: Neurology

## 2022-03-17 VITALS — BP 136/81 | HR 102 | Ht 65.5 in | Wt 165.0 lb

## 2022-03-17 DIAGNOSIS — R269 Unspecified abnormalities of gait and mobility: Secondary | ICD-10-CM

## 2022-03-17 DIAGNOSIS — E119 Type 2 diabetes mellitus without complications: Secondary | ICD-10-CM

## 2022-03-17 DIAGNOSIS — Z79899 Other long term (current) drug therapy: Secondary | ICD-10-CM

## 2022-03-17 DIAGNOSIS — G35 Multiple sclerosis: Secondary | ICD-10-CM | POA: Diagnosis not present

## 2022-03-17 DIAGNOSIS — R5383 Other fatigue: Secondary | ICD-10-CM

## 2022-03-17 DIAGNOSIS — R3915 Urgency of urination: Secondary | ICD-10-CM | POA: Insufficient documentation

## 2022-03-17 NOTE — Progress Notes (Signed)
[facility]  PATIENT: Katherine Yu DOB: 1951-03-14  REFERRING DOCTOR OR PCP: Claris Gower (PCP) SOURCE: Patient, notes from Dr. Jannifer Franklin, imaging and lab reports, MRI images personally reviewed.  _________________________________   HISTORICAL  CHIEF COMPLAINT:  Chief Complaint  Patient presents with   Follow-up    Rm 1, alone. Here for 6 month f/u for MS and migraine. On DF and tolerating well. MS stable, no new sx. Having 5 migraines at the most per month.     HISTORY OF PRESENT ILLNESS:  Katherine Yu is a 71 y.o. woman with MS and migraine  Update 03/17/2022 She was diagnosed with MS in 2004.     We stopped the Tecfidera (DMF) after last visit (last pill in February 2023).     No exacerbation or new symptoms.  .     Currently, her main problems are related to short term memory/cognitive, gait disturbance and urinary urgency  Her gait is off balance.  She has had some falls but no injuries..She uses the cane if outdoors.   She sees the rail on stairs.  She has a hand grab in the shower.   She denies weakness or numbness.   She notes right leg is slightly more clumsy than the left.   She denies diplopia or optic neuritis.    She is seeing an incontinence specialist (Dr. Zigmund Daniel, urogynecology,  [facility]) but is not on any medication.   She had urodynamic.    She has not been on bladder medication but urology told her they would not help.   She denies hesitancy but does not empty and often needs to go back a couple times.    She notes some fatigue.  She has insomnia associated with nocturia.  She prefers not to try DDAVP.   She has noted reduced focus/attention and reduced short-term memory.  This has not changed much over the past couple of years.  Vascular risks:  Does not some.   She was diagnosed with Type 2 NIDDM in 2022 but had elevated HgBA1c for a few years.  She is doing diet control with HgbA1c 6.4 when checked a month ago.    She has controlled  essential hypertension.   She does not have hyperlipidemia.     MS History: She was diagnosed with MS in 2004  She had intense headaches and had an MRI and stumbling     She was placed on Avonex but had some injection reactions,   She switched to Betaseron and did better.   She then switched to REbif (unclear why).   She was on Tysabri for several years.  Due to elevated JCV, she switched to one of her previous injectable medication sand then to glatiramer and then more recently to DMF.  She was switched due to more progression.  Specifically, she was having bladder dysfunction and memory issues and stumbling.     IMAGING: Brain MRI 01/30/2021 shows extensive single and confluent T2/FLAIR hyperintense foci in the hemispheres predominantly in the subcortical and deep white matter with large confluencies in the periatrial white matter and over the frontal horns.  Some foci are in the corpus callosum though but more are pericallosal.  There are no lesions in the infratentorial white matter.  Normal enhancement pattern.  Cerv/thoracic spine MRI shows a normal spinal cord.  Specifically.  There were no demyelinating plaques noted.  She does have multilevel degenerative changes but no significant spinal stenosis.  REVIEW OF SYSTEMS: Constitutional: No  fevers, chills, sweats, or change in appetite Eyes: No visual changes, double vision, eye pain Ear, nose and throat: No hearing loss, ear pain, nasal congestion, sore throat Cardiovascular: No chest pain, palpitations.  She has a pacemaker Respiratory:  No shortness of breath at rest or with exertion.   No wheezes GastrointestinaI: No nausea, vomiting, diarrhea, abdominal pain, fecal incontinence Genitourinary:  she has urinary frequency, retention and nocturia.   Musculoskeletal:  Notes neck pain, and back pain Integumentary: No rash,, skin lesions.  Notes itching Neurological: as above Psychiatric: No depression at this time.  No anxiety Endocrine: No  palpitations, diaphoresis, change in appetite, change in weigh or increased thirst Hematologic/Lymphatic:  No anemia, purpura, petechiae. Allergic/Immunologic: No itchy/runny eyes, nasal congestion, recent allergic reactions, rashes  ALLERGIES: Allergies  Allergen Reactions   Niacin Anaphylaxis, Hives, Itching and Rash    HOME MEDICATIONS:  Current Outpatient Medications:    aspirin 325 MG tablet, Take 325 mg by mouth every 6 (six) hours as needed for mild pain or headache., Disp: , Rfl:    benazepril (LOTENSIN) 40 MG tablet, TAKE 1 TABLET BY MOUTH ONCE DAILY., Disp: 90 tablet, Rfl: 1   Cholecalciferol (VITAMIN D3) 5000 UNITS TABS, Take 5,000 Units by mouth daily., Disp: , Rfl:    eletriptan (RELPAX) 40 MG tablet, TAKE 1 TABLET (40 MG TOTAL) BY MOUTH AS NEEDED FOR MIGRAINE. MAY REPEAT IN 2 HOURS IF HEADACHE PERSISTS OR RECURS., Disp: 24 tablet, Rfl: 5   famotidine (PEPCID) 20 MG tablet, Take 20 mg by mouth daily as needed for heartburn or indigestion., Disp: , Rfl:    hydrochlorothiazide (HYDRODIURIL) 25 MG tablet, Take 1 tablet  by mouth daily., Disp: 90 tablet, Rfl: 1   hydrOXYzine (ATARAX) 25 MG tablet, Take 1 tablet (25 mg total) by mouth every 6 (six) hours as needed for itching., Disp: 30 tablet, Rfl: 0   ibuprofen (ADVIL) 200 MG tablet, Take 400 mg by mouth every 6 (six) hours as needed for mild pain., Disp: , Rfl:    meclizine (ANTIVERT) 25 MG tablet, Take 25 mg by mouth 3 (three) times daily as needed for dizziness., Disp: , Rfl:    metoprolol succinate (TOPROL-XL) 50 MG 24 hr tablet, Take 1 tablet (50 mg total) by mouth daily. Take with or immediately following a meal., Disp: 90 tablet, Rfl: 3   ondansetron (ZOFRAN-ODT) 8 MG disintegrating tablet, Dissolve 1 tablet (8 mg total) by mouth every 8 (eight) hours as needed for nausea., Disp: 60 tablet, Rfl: 12  PAST MEDICAL HISTORY: Past Medical History:  Diagnosis Date   ARTHRITIS 02/20/2009   Qualifier: Diagnosis of  By: Ronne Binning     CHEST PAIN-PRECORDIAL 03/02/2009   Qualifier: Diagnosis of  By: Verl Blalock, MD, Estevan Oaks C    Chronic insomnia    Degenerative arthritis    DEPRESSION 02/20/2009   Qualifier: Diagnosis of  By: Ronne Binning     DEPRESSION 02/20/2009   Qualifier: Diagnosis of  By: Orville Govern CMA, Carol     Dyslipidemia    FATIGUE 02/20/2009   Qualifier: Diagnosis of  By: Orville Govern CMA, Carol     GERD 02/20/2009   Qualifier: Diagnosis of  By: Orville Govern CMA, Carol     History of lower leg fracture    History of peptic ulcer    History of seizures    In childhood   HYPERLIPIDEMIA-MIXED 05/04/2010   Qualifier: Diagnosis of  By: Verl Blalock, MD, Delanna Ahmadi    Hypertension  Migraine    Mild obesity    MULTIPLE SCLEROSIS 02/20/2009   Qualifier: Diagnosis of  By: Orville Govern, CMA, Carol     Plantar fasciitis    Temporomandibular joint disease    Urinary incontinence     PAST SURGICAL HISTORY: Past Surgical History:  Procedure Laterality Date   APPENDECTOMY     GALLBLADDER SURGERY     metal plate resection     skull   PACEMAKER IMPLANT N/A 04/19/2021   Procedure: PACEMAKER IMPLANT;  Surgeon: Vickie Epley, MD;  Location: [facility];  Service: Cardiovascular;  Laterality: N/A;   pilonidal cyst      FAMILY HISTORY: Family History  Adopted: Yes  Problem Relation Age of Onset   Lupus Sister    Cancer Sister        bones, brain but not sure where it started   Heart attack Sister    Heart attack Sister    Breast cancer Neg Hx     SOCIAL HISTORY:   Social History   Socioeconomic History   Marital status: Married    Spouse name: Mikeal Hawthorne   Number of children: 3   Years of education: Masters   Highest education level: Not on file  Occupational History   Occupation: Equities trader  Tobacco Use   Smoking status: Never   Smokeless tobacco: Never  Vaping Use   Vaping Use: Never used  Substance and Sexual Activity   Alcohol use: No   Drug use: No   Sexual activity: Not  Currently  Other Topics Concern   Not on file  Social History Narrative   Patient is right handed.   Patient drinks 1 cup caffeine daily.   Lives with husband and son.       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      Family: 3 children - Judson Roch, Marjory Lies (lives with her), [place]      Enjoys: reading and walking, meditate      Exercise: walking, and home exercise routine   Diet: diabetic diet      Safety   Seat belts: Yes    Guns: Yes  and in gun safe   Safe in relationships: Yes       Social Determinants of Health   Financial Resource Strain: [place]  (09/27/2021)   Overall Financial Resource Strain (CARDIA)    Difficulty of Paying Living Expenses: Not hard at all  Food Insecurity: Not on file  Transportation Needs: No Transportation Needs (09/27/2021)   PRAPARE - Hydrologist (Medical): No    Lack of Transportation (Non-Medical): No  Physical Activity: Not on file  Stress: Not on file  Social Connections: Not on file  Intimate Partner Violence: Unknown (09/27/2021)   Humiliation, Afraid, Rape, and Kick questionnaire    Fear of Current or Ex-Partner: No    Emotionally Abused: Not on file    Physically Abused: Not on file    Sexually Abused: Not on file    Addendum: No partner violence.  No unmet transportation need.  Has gotten lost driving and does not drive at night  PHYSICAL EXAM  Vitals:   03/17/22 1046  BP: 136/81  Pulse: (!) 102  Weight: 165 lb (74.8 kg)  Height: 5' 5.5" (1.664 m)    Body mass index  is 27.04 kg/m.   General: The patient is well-developed and well-nourished and in no acute distress  HEENT:  Head is [place]/AT.  Sclera are anicteric.    Skin: Extremities are without rash or  edema.  Neurologic Exam  Mental status: The patient is alert and oriented x 3 at the time of the examination.  The patient has apparent normal recent and remote memory, with an apparently normal attention span and concentration ability.   Speech is normal.  Cranial nerves: Extraocular movements are full.  Facial strength and sensation was normal.. No obvious hearing deficits are noted.  Motor:  Muscle bulk is normal.   Tone is normal. Strength is  5 / 5 in all 4 extremities.   Sensory: Sensory testing is intact to pinprick, soft touch and vibration sensation in all 4 extremities except mild reduced vibration sensation at toes  Coordination: Cerebellar testing reveals good finger-nose-finger and mildly reduced heel-to-shin bilaterally.  Gait and station: Station is normal.  Her gait is mildly wide but she can walk without a cane.  Tandem is poor.  Romberg is negative.   Reflexes: Deep tendon reflexes are symmetric and normal in arms, 3+ at the knees.  No ankle clonus.      DIAGNOSTIC DATA (LABS, IMAGING, TESTING) - I reviewed patient records, labs, notes, testing and imaging myself where available.  Lab Results  Component Value Date   WBC 4.5 07/28/2021   HGB 15.6 07/28/2021   HCT 43.4 07/28/2021   MCV 87 07/28/2021   PLT 220 07/28/2021      Component Value Date/Time   NA 144 07/28/2021 1426   K 3.8 07/28/2021 1426   CL 102 07/28/2021 1426   CO2 27 07/28/2021 1426   GLUCOSE 113 (H) 07/28/2021 1426   GLUCOSE 111 (H) 04/19/2021 1130   BUN 22 07/28/2021 1426   CREATININE 0.79 07/28/2021 1426   CALCIUM 9.7 07/28/2021 1426   PROT 7.3 07/28/2021 1426   ALBUMIN 4.9 (H) 07/28/2021 1426   AST 23 07/28/2021 1426   ALT 26 07/28/2021 1426   ALKPHOS 71 07/28/2021 1426   BILITOT <0.2 07/28/2021 1426   GFRNONAA 58 (L) 04/19/2021 1130   GFRAA 73 10/31/2019 1107   Lab Results  Component Value Date   CHOL 146 08/10/2020   HDL 42 08/10/2020   LDLCALC 88 08/10/2020   TRIG 85 08/10/2020   CHOLHDL 3.5 08/10/2020   Lab Results  Component Value Date   HGBA1C 5.8 (A) 10/29/2021   No results  found for: "VITAMINB12" Lab Results  Component Value Date   TSH 2.880 07/28/2021       ASSESSMENT AND PLAN  MULTIPLE SCLEROSIS - Plan: VITAMIN D 25 Hydroxy (Vit-D Deficiency, Fractures), Hemoglobin A1c, CBC with Differential/Platelet  Type 2 diabetes mellitus without complication, without long-term current use of insulin (HCC) - Plan: Hemoglobin A1c  Gait disorder  Urinary urgency  Other fatigue  High risk medication use - Plan: CBC with Differential/Platelet    She stopped the dimethyl fumarate earlier this year.  Her MS appears to be stable though she does feel more fatigued.  I will check some lab work.  We will check an MRI of the brain around the time of her next visit to determine if there is any subclinical progression.  If this is occurring we need to reconsider restarting a disease modifying therapy. Continue Relpax for migraine. Return in 6 months or sooner if there are new or worsening neurologic symptoms.   Celedonio Sortino A. Felecia Shelling, MD,  PhD,FAAN 01/10/2256, 5:05 PM Certified in Neurology, Clinical Neurophysiology, Sleep Medicine and Neuroimaging  [facility] [address], [place], [place] [address] [phone]

## 2022-03-18 ENCOUNTER — Encounter: Payer: Self-pay | Admitting: Neurology

## 2022-03-18 ENCOUNTER — Encounter: Payer: Self-pay | Admitting: Family Medicine

## 2022-03-18 LAB — CBC WITH DIFFERENTIAL/PLATELET
Basophils Absolute: 0 10*3/uL (ref 0.0–0.2)
Basos: 0 %
EOS (ABSOLUTE): 0.1 10*3/uL (ref 0.0–0.4)
Eos: 2 %
Hematocrit: 47.7 % — ABNORMAL HIGH (ref 34.0–46.6)
Hemoglobin: 16.3 g/dL — ABNORMAL HIGH (ref 11.1–15.9)
Immature Grans (Abs): 0 10*3/uL (ref 0.0–0.1)
Immature Granulocytes: 0 %
Lymphocytes Absolute: 0.9 10*3/uL (ref 0.7–3.1)
Lymphs: 16 %
MCH: 30.4 pg (ref 26.6–33.0)
MCHC: 34.2 g/dL (ref 31.5–35.7)
MCV: 89 fL (ref 79–97)
Monocytes Absolute: 0.6 10*3/uL (ref 0.1–0.9)
Monocytes: 11 %
Neutrophils Absolute: 3.7 10*3/uL (ref 1.4–7.0)
Neutrophils: 71 %
Platelets: 234 10*3/uL (ref 150–450)
RBC: 5.36 x10E6/uL — ABNORMAL HIGH (ref 3.77–5.28)
RDW: 13 % (ref 11.7–15.4)
WBC: 5.3 10*3/uL (ref 3.4–10.8)

## 2022-03-18 LAB — HEMOGLOBIN A1C
Est. average glucose Bld gHb Est-mCnc: 126 mg/dL
Hgb A1c MFr Bld: 6 % — ABNORMAL HIGH (ref 4.8–5.6)

## 2022-03-18 LAB — VITAMIN D 25 HYDROXY (VIT D DEFICIENCY, FRACTURES): Vit D, 25-Hydroxy: 82.4 ng/mL (ref 30.0–100.0)

## 2022-03-21 ENCOUNTER — Encounter: Payer: Self-pay | Admitting: Family

## 2022-03-21 ENCOUNTER — Ambulatory Visit (INDEPENDENT_AMBULATORY_CARE_PROVIDER_SITE_OTHER): Payer: PPO | Admitting: Family

## 2022-03-21 ENCOUNTER — Encounter: Payer: Self-pay | Admitting: Neurology

## 2022-03-21 VITALS — BP 140/84 | HR 94 | Temp 98.6°F | Resp 16 | Ht 65.5 in | Wt 171.0 lb

## 2022-03-21 DIAGNOSIS — N6324 Unspecified lump in the left breast, lower inner quadrant: Secondary | ICD-10-CM | POA: Insufficient documentation

## 2022-03-21 NOTE — Assessment & Plan Note (Signed)
Ordered dx left breast u/s and mammogram pending results Suspected knot from fall however will r/o other etiologies.

## 2022-03-21 NOTE — Patient Instructions (Signed)
Call [facility] to schedule your diagnostic mammogram of your left breast with your breast ultrasound.   Due to recent changes in healthcare laws, you may see results of your imaging and/or laboratory studies on MyChart before I have had a chance to review them.  I understand that in some cases there may be results that are confusing or concerning to you. Please understand that not all results are received at the same time and often I may need to interpret multiple results in order to provide you with the best plan of care or course of treatment. Therefore, I ask that you please give me 2 business days to thoroughly review all your results before contacting my office for clarification. Should we see a critical lab result, you will be contacted sooner.   It was a pleasure seeing you today! Please do not hesitate to reach out with any questions and or concerns.  Regards,   Eugenia Pancoast FNP-C

## 2022-03-21 NOTE — Progress Notes (Signed)
Established Patient Office Visit  Subjective:  Patient ID: Katherine Yu, female    DOB: Jan 24, 1951  Age: 71 y.o. MRN: [id number]  CC:  Chief Complaint  Patient presents with  . Cyst    Left breast and redness but gone today.    HPI Katherine Yu is here today with concerns.   Fell twice last week,  Noticed left breast redness five nights ago after her bath. She had redness of her left breast. She had first noticed a red tender area around outer left nipple. Still with a knot in her breast area, tender at current.  When she fell she went to the right side she hit the rocking chair and a few other things at the time. She states she has MS, and she does fall, but states honestly was due to carelessness as she was not using her cane as she typically does.she was coming up stairs with laundry basket in her hands, and when coming up her steps she thought she was ready for the next step and next thing she knew she was falling.   Mammogram 06/15/21, no findings suspicious for malignancy.   Past Medical History:  Diagnosis Date  . ARTHRITIS 02/20/2009   Qualifier: Diagnosis of  By: Ronne Binning    . CHEST PAIN-PRECORDIAL 03/02/2009   Qualifier: Diagnosis of  By: Verl Blalock, MD, Delanna Ahmadi Chronic insomnia   . Degenerative arthritis   . DEPRESSION 02/20/2009   Qualifier: Diagnosis of  By: Ronne Binning    . DEPRESSION 02/20/2009   Qualifier: Diagnosis of  By: Ronne Binning    . Dyslipidemia   . FATIGUE 02/20/2009   Qualifier: Diagnosis of  By: Ronne Binning    . GERD 02/20/2009   Qualifier: Diagnosis of  By: Ronne Binning    . History of lower leg fracture   . History of peptic ulcer   . History of seizures    In childhood  . HYPERLIPIDEMIA-MIXED 05/04/2010   Qualifier: Diagnosis of  By: Verl Blalock, MD, Delanna Ahmadi Hypertension   . Migraine   . Mild obesity   . MULTIPLE SCLEROSIS 02/20/2009   Qualifier: Diagnosis of  By: Orville Govern CMA, Arbie Cookey    . Plantar  fasciitis   . Temporomandibular joint disease   . Urinary incontinence     Past Surgical History:  Procedure Laterality Date  . APPENDECTOMY    . GALLBLADDER SURGERY    . metal plate resection     skull  . PACEMAKER IMPLANT N/A 04/19/2021   Procedure: PACEMAKER IMPLANT;  Surgeon: Vickie Epley, MD;  Location: [facility];  Service: Cardiovascular;  Laterality: N/A;  . pilonidal cyst      Family History  Adopted: Yes  Problem Relation Age of Onset  . Lupus Sister   . Cancer Sister        bones, brain but not sure where it started  . Heart attack Sister   . Heart attack Sister   . Breast cancer Neg Hx     Social History   Socioeconomic History  . Marital status: Married    Spouse name: Mikeal Hawthorne  . Number of children: 3  . Years of education: Masters  . Highest education level: Not on file  Occupational History  . Occupation: Equities trader  Tobacco Use  . Smoking status: Never  . Smokeless tobacco: Never  Vaping Use  . Vaping Use: Never used  Substance and Sexual Activity  . Alcohol use: No  . Drug use: No  . Sexual activity: Not Currently  Other Topics Concern  . Not on file  Social History Narrative   Patient is right handed.   Patient drinks 1 cup caffeine daily.   Lives with husband and son.       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 Difficulty of Paying Living Expenses: Not hard at all  Food Insecurity:  Not on file  Transportation Needs: No Transportation Needs (09/27/2021)   PRAPARE - Transportation   . Lack of Transportation (Medical): No   . Lack of Transportation (Non-Medical): No  Physical Activity: Not on file  Stress: Not on file  Social Connections: Not on file  Intimate Partner Violence: Unknown (09/27/2021)   Humiliation, Afraid, Rape, and Kick questionnaire   . Fear of Current or Ex-Partner: No   . Emotionally Abused: Not on file   . Physically Abused: Not on file   . Sexually Abused: Not on file    Outpatient Medications Prior to Visit  Medication Sig Dispense Refill  . aspirin 325 MG tablet Take 325 mg by mouth every 6 (six) hours as needed for mild pain or headache.    . benazepril (LOTENSIN) 40 MG tablet TAKE 1 TABLET BY MOUTH ONCE DAILY. 90 tablet 1  . Cholecalciferol (VITAMIN D3) 5000 UNITS TABS Take 5,000 Units by mouth daily.    Marland Kitchen eletriptan (RELPAX) 40 MG tablet TAKE 1 TABLET (40 MG TOTAL) BY MOUTH AS NEEDED FOR MIGRAINE. MAY REPEAT IN 2 HOURS IF HEADACHE PERSISTS OR RECURS. 24 tablet 5  . famotidine (PEPCID) 20 MG tablet Take 20 mg by mouth daily as needed for heartburn or indigestion.    . hydrochlorothiazide (HYDRODIURIL) 25 MG tablet Take 1 tablet  by mouth daily. 90 tablet 1  . hydrOXYzine (ATARAX) 25 MG tablet Take 1 tablet (25 mg total) by mouth every 6 (six) hours as needed for itching. 30 tablet 0  . ibuprofen (ADVIL) 200 MG tablet Take 400 mg by mouth every 6 (six) hours as needed for mild pain.    . meclizine (ANTIVERT) 25 MG tablet Take 25 mg by mouth 3 (three) times daily as needed for dizziness.    . metoprolol succinate (TOPROL-XL) 50 MG 24 hr tablet Take 1 tablet (50 mg total) by mouth daily. Take with or immediately following a meal. 90 tablet 3  . ondansetron (ZOFRAN-ODT) 8 MG disintegrating tablet Dissolve 1 tablet (8 mg total) by mouth every 8 (eight) hours as needed for nausea. 60 tablet 12   No facility-administered medications prior to visit.     Allergies  Allergen Reactions  . Niacin Anaphylaxis, Hives, Itching and Rash        Objective:  Physical Exam Constitutional:      General: She is not in acute distress.    Appearance: She is obese. She is not ill-appearing, toxic-appearing or diaphoretic.  Cardiovascular:     Rate and Rhythm: Normal rate.  Chest:  Breasts:    Tanner Score is 5.     Breasts are symmetrical.     Right: Normal. No swelling, bleeding, inverted nipple, mass, nipple discharge, skin change or tenderness.     Left: Mass (with tenderness left inner quadrant) and tenderness present. No swelling, bleeding, inverted nipple, nipple discharge or skin change.    Genitourinary:    Vagina: Normal. No vaginal discharge or tenderness.     Cervix: No discharge, erythema or cervical bleeding.     Adnexa:        Right: No mass, tenderness or fullness.         Left: No mass, tenderness or fullness.       Rectum: No external hemorrhoid.  Lymphadenopathy:     Upper Body:     Right upper body: No supraclavicular, axillary or pectoral adenopathy.     Left upper body: No supraclavicular, axillary or pectoral adenopathy.  Skin:    General: Skin is warm.  Neurological:     General: No focal deficit present.     Mental Status: She is alert and oriented to person, place, and time. Mental status is at baseline.  Psychiatric:        Mood and Affect: Mood normal.        Behavior: Behavior normal.        Thought Content: Thought content normal.        Judgment: Judgment normal.       BP 140/84   Pulse 94   Temp 98.6 F (37 C)   Resp 16   Ht 5' 5.5" (1.664 m)   Wt 171 lb (77.6 kg)   SpO2 98%   BMI 28.02 kg/m  Wt Readings from Last 3 Encounters:  03/21/22 171 lb (77.6 kg)  03/17/22 165 lb (74.8 kg)  10/29/21 175 lb (79.4 kg)     Health Maintenance Due  Topic Date Due  . TETANUS/TDAP  Never done  . COLONOSCOPY (Pts 45-86yr Insurance coverage will need to be confirmed)  Never done  .  Pneumonia Vaccine 71 Years old (2 - PCV) 09/23/2020  . COVID-19 Vaccine (5 - Booster for Pfizer series) 02/23/2021    There are no preventive care reminders to display for this patient.  Lab Results  Component Value Date   TSH 2.880 07/28/2021   Lab Results  Component Value Date   WBC 5.3 03/17/2022   HGB 16.3 (H) 03/17/2022   HCT 47.7 (H) 03/17/2022   MCV 89 03/17/2022   PLT 234 03/17/2022   Lab Results  Component Value Date   NA 144 07/28/2021   K 3.8 07/28/2021   CO2 27 07/28/2021   GLUCOSE 113 (H) 07/28/2021   BUN 22 07/28/2021   CREATININE 0.79 07/28/2021   BILITOT <0.2 07/28/2021   ALKPHOS 71 07/28/2021   AST 23 07/28/2021   ALT 26 07/28/2021   PROT 7.3 07/28/2021   ALBUMIN 4.9 (H) 07/28/2021   CALCIUM 9.7 07/28/2021   ANIONGAP 9 04/19/2021   EGFR 81 07/28/2021   Lab Results  Component Value Date   HGBA1C 6.0 (H) 03/17/2022      Assessment & Plan:   Problem List Items Addressed This Visit       Other   Mass  of lower inner quadrant of left breast - Primary    Ordered dx left breast u/s and mammogram pending results Suspected knot from fall however will r/o other etiologies.       Relevant Orders   MM DIAG BREAST TOMO UNI LEFT   US BREAST LTD UNI LEFT INC AXILLA    No orders of the defined types were placed in this encounter.   Follow-up: Return if symptoms worsen or fail to improve with pcp.    Eugenia Pancoast, FNP

## 2022-03-23 ENCOUNTER — Encounter: Payer: Self-pay | Admitting: Podiatry

## 2022-03-23 ENCOUNTER — Other Ambulatory Visit (HOSPITAL_COMMUNITY): Payer: Self-pay

## 2022-03-30 ENCOUNTER — Ambulatory Visit
Admission: RE | Admit: 2022-03-30 | Discharge: 2022-03-30 | Disposition: A | Discharge status: Home / Self Care | Payer: PPO | Source: Ambulatory Visit | Attending: Family | Admitting: Family

## 2022-03-30 DIAGNOSIS — N6324 Unspecified lump in the left breast, lower inner quadrant: Secondary | ICD-10-CM

## 2022-04-13 ENCOUNTER — Other Ambulatory Visit (HOSPITAL_COMMUNITY): Payer: Self-pay

## 2022-04-13 MED ORDER — AMOXICILLIN 500 MG PO CAPS
ORAL_CAPSULE | ORAL | 0 refills | Status: DC
  Filled 2022-04-13: qty 21, 7d supply, fill #0

## 2022-04-25 ENCOUNTER — Other Ambulatory Visit (HOSPITAL_COMMUNITY): Payer: Self-pay

## 2022-04-25 ENCOUNTER — Ambulatory Visit (INDEPENDENT_AMBULATORY_CARE_PROVIDER_SITE_OTHER): Payer: PPO

## 2022-04-25 ENCOUNTER — Other Ambulatory Visit: Payer: Self-pay

## 2022-04-25 ENCOUNTER — Encounter: Payer: Self-pay | Admitting: Neurology

## 2022-04-25 DIAGNOSIS — I442 Atrioventricular block, complete: Secondary | ICD-10-CM | POA: Diagnosis not present

## 2022-04-25 MED ORDER — ELETRIPTAN HYDROBROMIDE 40 MG PO TABS
40.0000 mg | ORAL_TABLET | ORAL | 5 refills | Status: DC | PRN
  Filled 2022-04-25: qty 24, 90d supply, fill #0

## 2022-04-26 ENCOUNTER — Encounter: Payer: Self-pay | Admitting: Cardiology

## 2022-04-26 ENCOUNTER — Other Ambulatory Visit (HOSPITAL_COMMUNITY): Payer: Self-pay

## 2022-04-26 ENCOUNTER — Other Ambulatory Visit: Payer: Self-pay | Admitting: Neurology

## 2022-04-26 LAB — CUP PACEART REMOTE DEVICE CHECK
Battery Remaining Longevity: 119 mo
Battery Voltage: 3.03 V
Brady Statistic AP VP Percent: 0.08 %
Brady Statistic AP VS Percent: 0 %
Brady Statistic AS VP Percent: 99.65 %
Brady Statistic AS VS Percent: 0.26 %
Brady Statistic RA Percent Paced: 0.1 %
Brady Statistic RV Percent Paced: 99.73 %
Date Time Interrogation Session: 20230820231623
Implantable Lead Implant Date: 20220815
Implantable Lead Implant Date: 20220815
Implantable Lead Location: 753859
Implantable Lead Location: 753860
Implantable Lead Model: 3830
Implantable Lead Model: 5076
Implantable Pulse Generator Implant Date: 20220815
Lead Channel Impedance Value: 323 Ohm
Lead Channel Impedance Value: 361 Ohm
Lead Channel Impedance Value: 608 Ohm
Lead Channel Impedance Value: 627 Ohm
Lead Channel Pacing Threshold Amplitude: 1.125 V
Lead Channel Pacing Threshold Amplitude: 1.25 V
Lead Channel Pacing Threshold Pulse Width: 0.4 ms
Lead Channel Pacing Threshold Pulse Width: 0.4 ms
Lead Channel Sensing Intrinsic Amplitude: 3.375 mV
Lead Channel Sensing Intrinsic Amplitude: 3.375 mV
Lead Channel Sensing Intrinsic Amplitude: 5.5 mV
Lead Channel Sensing Intrinsic Amplitude: 5.5 mV
Lead Channel Setting Pacing Amplitude: 2.25 V
Lead Channel Setting Pacing Amplitude: 2.75 V
Lead Channel Setting Pacing Pulse Width: 0.4 ms
Lead Channel Setting Sensing Sensitivity: 0.9 mV

## 2022-04-27 ENCOUNTER — Other Ambulatory Visit (HOSPITAL_COMMUNITY): Payer: Self-pay

## 2022-04-27 NOTE — Telephone Encounter (Signed)
Spoke with [facility]. Drug not covered but Sumatriptan and Rizatriptan are. I don't see any history of trial/failure of these in chart.

## 2022-04-28 ENCOUNTER — Ambulatory Visit (INDEPENDENT_AMBULATORY_CARE_PROVIDER_SITE_OTHER): Payer: PPO | Admitting: Family Medicine

## 2022-04-28 ENCOUNTER — Other Ambulatory Visit: Payer: Self-pay | Admitting: Neurology

## 2022-04-28 ENCOUNTER — Other Ambulatory Visit (HOSPITAL_COMMUNITY): Payer: Self-pay

## 2022-04-28 VITALS — BP 138/80 | HR 87 | Temp 97.8°F | Ht 65.5 in | Wt 168.1 lb

## 2022-04-28 DIAGNOSIS — I10 Essential (primary) hypertension: Secondary | ICD-10-CM

## 2022-04-28 DIAGNOSIS — Z1211 Encounter for screening for malignant neoplasm of colon: Secondary | ICD-10-CM | POA: Diagnosis not present

## 2022-04-28 DIAGNOSIS — Z0000 Encounter for general adult medical examination without abnormal findings: Secondary | ICD-10-CM | POA: Diagnosis not present

## 2022-04-28 DIAGNOSIS — E782 Mixed hyperlipidemia: Secondary | ICD-10-CM | POA: Diagnosis not present

## 2022-04-28 DIAGNOSIS — T466X5A Adverse effect of antihyperlipidemic and antiarteriosclerotic drugs, initial encounter: Secondary | ICD-10-CM

## 2022-04-28 DIAGNOSIS — G720 Drug-induced myopathy: Secondary | ICD-10-CM

## 2022-04-28 LAB — LIPID PANEL
Cholesterol: 156 mg/dL (ref 0–200)
HDL: 42.4 mg/dL (ref >39.00)
LDL Cholesterol: 87 mg/dL (ref 0–99)
NonHDL: 113.25
Total CHOL/HDL Ratio: 4
Triglycerides: 130 mg/dL (ref 0.0–149.0)
VLDL: 26 mg/dL (ref 0.0–40.0)

## 2022-04-28 LAB — COMPREHENSIVE METABOLIC PANEL
ALT: 15 U/L (ref 0–35)
AST: 18 U/L (ref 0–37)
Albumin: 4.6 g/dL (ref 3.5–5.2)
Alkaline Phosphatase: 56 U/L (ref 39–117)
BUN: 22 mg/dL (ref 6–23)
CO2: 30 mEq/L (ref 19–32)
Calcium: 10 mg/dL (ref 8.4–10.5)
Chloride: 102 mEq/L (ref 96–112)
Creatinine, Ser: 1 mg/dL (ref 0.40–1.20)
GFR: 57.65 mL/min — ABNORMAL LOW (ref >60.00)
Glucose, Bld: 111 mg/dL — ABNORMAL HIGH (ref 70–99)
Potassium: 3.7 mEq/L (ref 3.5–5.1)
Sodium: 140 mEq/L (ref 135–145)
Total Bilirubin: 0.4 mg/dL (ref 0.2–1.2)
Total Protein: 7.6 g/dL (ref 6.0–8.3)

## 2022-04-28 MED ORDER — RIZATRIPTAN BENZOATE 10 MG PO TABS
10.0000 mg | ORAL_TABLET | ORAL | 5 refills | Status: DC | PRN
  Filled 2022-04-28: qty 10, 30d supply, fill #0
  Filled 2022-09-12: qty 10, 30d supply, fill #1
  Filled 2022-10-24: qty 10, 30d supply, fill #2
  Filled 2023-01-17: qty 10, 30d supply, fill #3
  Filled 2023-04-19: qty 10, 30d supply, fill #4

## 2022-04-28 NOTE — Progress Notes (Signed)
Subjective:    Katherine Yu is a 71 y.o. female who presents for a Welcome to Medicare exam.   Review of Systems Review of Systems  Constitutional:  Negative for chills and fever.  HENT:  Negative for congestion and sore throat.   Eyes:  Negative for blurred vision and double vision.  Respiratory:  Negative for shortness of breath.   Cardiovascular:  Negative for chest pain.  Gastrointestinal:  Negative for heartburn, nausea and vomiting.  Genitourinary: Negative.   Musculoskeletal: Negative.  Negative for myalgias.  Skin:  Negative for rash.  Neurological:  Negative for dizziness and headaches.  Endo/Heme/Allergies:  Does not bruise/bleed easily.  Psychiatric/Behavioral:  Negative for depression. The patient is not nervous/anxious.     Cardiac Risk Factors include: advanced age (>66mn, >>68women)      Objective:    Today's Vitals   04/28/22 0801  BP: 138/80  Pulse: 87  Temp: 97.8 F (36.6 C)  TempSrc: Temporal  SpO2: 97%  Weight: 168 lb 2 oz (76.3 kg)  Height: 5' 5.5" (1.664 m)  Body mass index is 27.55 kg/m.  Medications Outpatient Encounter Medications as of 04/28/2022  Medication Sig   aspirin 325 MG tablet Take 325 mg by mouth every 6 (six) hours as needed for mild pain or headache.   benazepril (LOTENSIN) 40 MG tablet TAKE 1 TABLET BY MOUTH ONCE DAILY.   Cholecalciferol (VITAMIN D3) 5000 UNITS TABS Take 5,000 Units by mouth daily.   eletriptan (RELPAX) 40 MG tablet Take 1 tablet (40 mg total) by mouth as needed for migraine. May repeat in 2 hours if headache persists or recurs.   famotidine (PEPCID) 20 MG tablet Take 20 mg by mouth daily as needed for heartburn or indigestion.   hydrochlorothiazide (HYDRODIURIL) 25 MG tablet Take 1 tablet  by mouth daily.   hydrOXYzine (ATARAX) 25 MG tablet Take 1 tablet (25 mg total) by mouth every 6 (six) hours as needed for itching.   ibuprofen (ADVIL) 200 MG tablet Take 400 mg by mouth every 6 (six) hours as needed for  mild pain.   meclizine (ANTIVERT) 25 MG tablet Take 25 mg by mouth 3 (three) times daily as needed for dizziness.   metoprolol succinate (TOPROL-XL) 50 MG 24 hr tablet Take 1 tablet (50 mg total) by mouth daily. Take with or immediately following a meal.   ondansetron (ZOFRAN-ODT) 8 MG disintegrating tablet Dissolve 1 tablet (8 mg total) by mouth every 8 (eight) hours as needed for nausea.   [DISCONTINUED] amoxicillin (AMOXIL) 500 MG capsule Take 1 capsule by mouth 3 times a day until complete.   No facility-administered encounter medications on file as of 04/28/2022.     History: Past Medical History:  Diagnosis Date   ARTHRITIS 02/20/2009   Qualifier: Diagnosis of  By: FHester Mates Carol     CHEST PAIN-PRECORDIAL 03/02/2009   Qualifier: Diagnosis of  By: WVerl Blalock MD, FEstevan OaksC    Chronic insomnia    Degenerative arthritis    DEPRESSION 02/20/2009   Qualifier: Diagnosis of  By: FRonne Binning    DEPRESSION 02/20/2009   Qualifier: Diagnosis of  By: FOrville GovernCMA, Carol     Dyslipidemia    FATIGUE 02/20/2009   Qualifier: Diagnosis of  By: FOrville GovernCMA, Carol     GERD 02/20/2009   Qualifier: Diagnosis of  By: FOrville GovernCMA, Carol     History of lower leg fracture    History of peptic ulcer  History of seizures    In childhood   HYPERLIPIDEMIA-MIXED 05/04/2010   Qualifier: Diagnosis of  By: Verl Blalock, MD, Estevan Oaks C    Hypertension    Migraine    Mild obesity    MULTIPLE SCLEROSIS 02/20/2009   Qualifier: Diagnosis of  By: Orville Govern, CMA, Carol     Plantar fasciitis    Temporomandibular joint disease    Urinary incontinence    Past Surgical History:  Procedure Laterality Date   APPENDECTOMY     GALLBLADDER SURGERY     metal plate resection     skull   PACEMAKER IMPLANT N/A 04/19/2021   Procedure: PACEMAKER IMPLANT;  Surgeon: Vickie Epley, MD;  Location: [facility];  Service: Cardiovascular;  Laterality: N/A;   pilonidal cyst      Family History  Adopted: Yes  Problem  Relation Age of Onset   Lupus Sister    Cancer Sister        bones, brain but not sure where it started   Heart attack Sister    Heart attack Sister    Breast cancer Neg Hx    Social History   Occupational History   Occupation: Equities trader  Tobacco Use   Smoking status: Never   Smokeless tobacco: Never  Vaping Use   Vaping Use: Never used  Substance and Sexual Activity   Alcohol use: No   Drug use: No   Sexual activity: Not Currently    Tobacco Counseling Counseling given: Not Answered   Immunizations and Health Maintenance Immunization History  Administered Date(s) Administered   PFIZER(Purple Top)SARS-COV-2 Vaccination 11/15/2019, 12/09/2019, 06/15/2020, 12/29/2020   Pneumococcal Polysaccharide-23 09/24/2019   Zoster Recombinat (Shingrix) 01/24/2020, 04/02/2020   Health Maintenance Due  Topic Date Due   TETANUS/TDAP  Never done   COLONOSCOPY (Pts 45-23yr Insurance coverage will need to be confirmed)  Never done   Pneumonia Vaccine 71 Years old (2 - PCV) 09/23/2020   COVID-19 Vaccine (5 - Pfizer risk series) 02/23/2021   INFLUENZA VACCINE  04/05/2022    Activities of Daily Living    04/28/2022    8:10 AM  In your present state of health, do you have any difficulty performing the following activities:  Hearing? 0  Vision? 0  Difficulty concentrating or making decisions? 1  Comment related to MS  Walking or climbing stairs? 0  Dressing or bathing? 0  Doing errands, shopping? 0  Preparing Food and eating ? N  Using the Toilet? N  In the past six months, have you accidently leaked urine? Y  Comment seeing Urogyn  Do you have problems with loss of bowel control? Y  Comment follows with GI  Managing your Medications? N  Managing your Finances? N  Housekeeping or managing your Housekeeping? N    Physical Exam    Physical Exam Constitutional:      General: She is not in acute distress.    Appearance: She is well-developed. She is not diaphoretic.   HENT:     Right Ear: External ear normal.     Left Ear: External ear normal.     Nose: Nose normal.  Eyes:     Conjunctiva/sclera: Conjunctivae normal.  Cardiovascular:     Rate and Rhythm: Normal rate and regular rhythm.     Heart sounds: No murmur heard. Pulmonary:     Effort: Pulmonary effort is normal. No respiratory distress.     Breath sounds: Normal breath sounds. No wheezing.  Musculoskeletal:  Cervical back: Neck supple.  Skin:    General: Skin is warm and dry.     Capillary Refill: Capillary refill takes less than 2 seconds.  Neurological:     Mental Status: She is alert. Mental status is at baseline.  Psychiatric:        Mood and Affect: Mood normal.        Behavior: Behavior normal.      Advanced Directives: Does Patient Have a Medical Advance Directive?: No Would patient like information on creating a medical advance directive?: Yes (MAU/Ambulatory/Procedural Areas - Information given)    Assessment:    This is a routine wellness examination for this patient .   Vision/Hearing screen No results found.  Dietary issues and exercise activities discussed:  Current Exercise Habits: Home exercise routine, Type of exercise: walking, Time (Minutes): 30, Frequency (Times/Week): 7, Weekly Exercise (Minutes/Week): 210, Intensity: Moderate, Exercise limited by: neurologic condition(s)   Goals      Weight (lb) < 160 lb (72.6 kg)      Depression Screen    04/28/2022    8:10 AM 10/29/2021    8:48 AM 05/07/2020    6:21 PM  PHQ 2/9 Scores  PHQ - 2 Score 0 0 0  PHQ- 9 Score  9      Fall Risk    10/29/2021    8:08 AM  Fall Risk   Falls in the past year? 1  Number falls in past yr: 1  Injury with Fall? 0  Risk for fall due to : History of fall(s);Impaired balance/gait    Cognitive Function:    Mini-Cog - 04/28/22 0812     Normal clock drawing test? yes    How many words correct? 2            Health Maintenance Due  Topic Date Due    TETANUS/TDAP  Never done   COLONOSCOPY (Pts 45-62yr Insurance coverage will need to be confirmed)  Never done   Pneumonia Vaccine 70 Years old (2 - PCV) 09/23/2020   COVID-19 Vaccine (5 - Pfizer risk series) 02/23/2021   INFLUENZA VACCINE  04/05/2022         Patient Care Team: CLesleigh Noe MD as PCP - General (Family Medicine) HMinus Breeding MD as PCP - Cardiology (Cardiology) LVickie Epley MD as PCP - Electrophysiology (Cardiology) MMarti Sleigh MD as Referring Physician (Urology) CChristophe Louis MD as Consulting Physician (Obstetrics and Gynecology) SFelecia Shelling RNanine Means MD (Neurology) HMonna Fam MD as Consulting Physician (Ophthalmology)     Plan:    I have personally reviewed and noted the following in the patient's chart:   Medical and social history Use of alcohol, tobacco or illicit drugs  Current medications and supplements Functional ability and status Nutritional status Physical activity Advanced directives List of other physicians Hospitalizations, surgeries, and ER visits in previous 12 months Vitals Screenings to include cognitive, depression, and falls Referrals and appointments  In addition, I have reviewed and discussed with patient certain preventive protocols, quality metrics, and best practice recommendations. A written personalized care plan for preventive services as well as general preventive health recommendations were provided to patient.     JLesleigh Noe MD 04/28/2022

## 2022-04-28 NOTE — Assessment & Plan Note (Signed)
Severe joint pain on simvastatin.

## 2022-04-28 NOTE — Patient Instructions (Addendum)
Stool test  Bring back Advanced Directive

## 2022-04-29 ENCOUNTER — Other Ambulatory Visit (HOSPITAL_COMMUNITY): Payer: Self-pay

## 2022-05-04 ENCOUNTER — Other Ambulatory Visit: Payer: Self-pay | Admitting: Obstetrics and Gynecology

## 2022-05-04 DIAGNOSIS — Z1231 Encounter for screening mammogram for malignant neoplasm of breast: Secondary | ICD-10-CM

## 2022-05-06 ENCOUNTER — Other Ambulatory Visit (INDEPENDENT_AMBULATORY_CARE_PROVIDER_SITE_OTHER): Payer: PPO

## 2022-05-06 DIAGNOSIS — Z1211 Encounter for screening for malignant neoplasm of colon: Secondary | ICD-10-CM

## 2022-05-10 ENCOUNTER — Encounter: Payer: Self-pay | Admitting: Family Medicine

## 2022-05-10 ENCOUNTER — Telehealth: Payer: Self-pay

## 2022-05-10 DIAGNOSIS — R195 Other fecal abnormalities: Secondary | ICD-10-CM

## 2022-05-10 LAB — FECAL OCCULT BLOOD, IMMUNOCHEMICAL: Fecal Occult Bld: POSITIVE — AB

## 2022-05-10 NOTE — Telephone Encounter (Signed)
Called pt  Referral to GI placed

## 2022-05-10 NOTE — Telephone Encounter (Signed)
See were positive results were received this am. Did not see where patient was given that information yet? Did you talk with patient? Is there any information that you need me to give her.

## 2022-05-10 NOTE — Telephone Encounter (Signed)
Lori from [facility] called in critical results. Positive IFOB Results given to Dr Einar Pheasant in person.

## 2022-05-10 NOTE — Telephone Encounter (Signed)
Noted - see mychart encounter with GI referral

## 2022-05-10 NOTE — Addendum Note (Signed)
Addended by: Waunita Schooner R on: 05/10/2022 12:58 PM   Modules accepted: Orders

## 2022-05-12 ENCOUNTER — Encounter: Payer: Self-pay | Admitting: Family Medicine

## 2022-05-16 ENCOUNTER — Encounter: Payer: Self-pay | Admitting: Gastroenterology

## 2022-05-16 ENCOUNTER — Other Ambulatory Visit: Payer: Self-pay | Admitting: Family Medicine

## 2022-05-17 ENCOUNTER — Other Ambulatory Visit (HOSPITAL_COMMUNITY): Payer: Self-pay

## 2022-05-17 MED ORDER — HYDROCHLOROTHIAZIDE 25 MG PO TABS
25.0000 mg | ORAL_TABLET | Freq: Every day | ORAL | 1 refills | Status: DC
  Filled 2022-05-17: qty 90, 90d supply, fill #0
  Filled 2022-08-13: qty 90, 90d supply, fill #1

## 2022-05-18 ENCOUNTER — Other Ambulatory Visit (HOSPITAL_COMMUNITY): Payer: Self-pay

## 2022-05-21 NOTE — Progress Notes (Signed)
Remote pacemaker transmission.   

## 2022-05-27 IMAGING — MG DIGITAL SCREENING BILAT W/ TOMO W/ CAD
8 series · 9 of 24 positions shown · non-contrast
Comparison: Previous exam(s).

CLINICAL DATA: Screening.

EXAM:
DIGITAL SCREENING BILATERAL MAMMOGRAM WITH TOMO AND CAD

[R CC synth-2D]
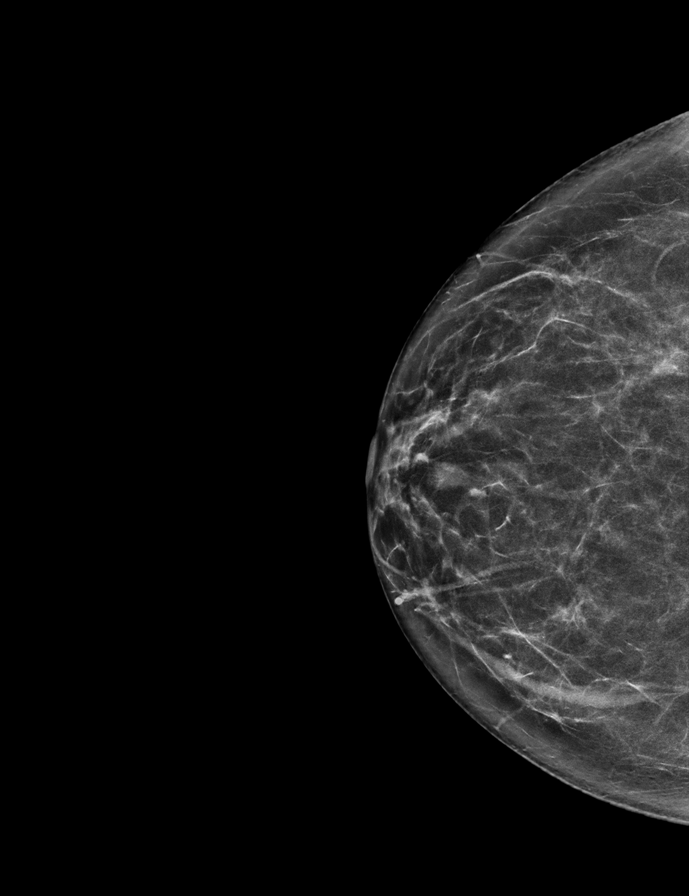

[L MLO synth-2D]
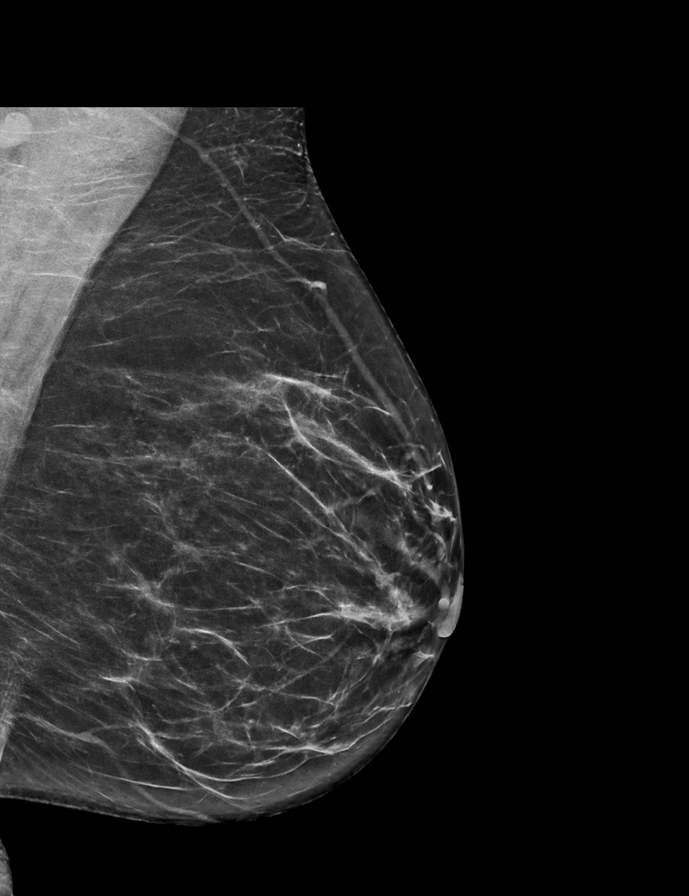

[R MLO synth-2D]
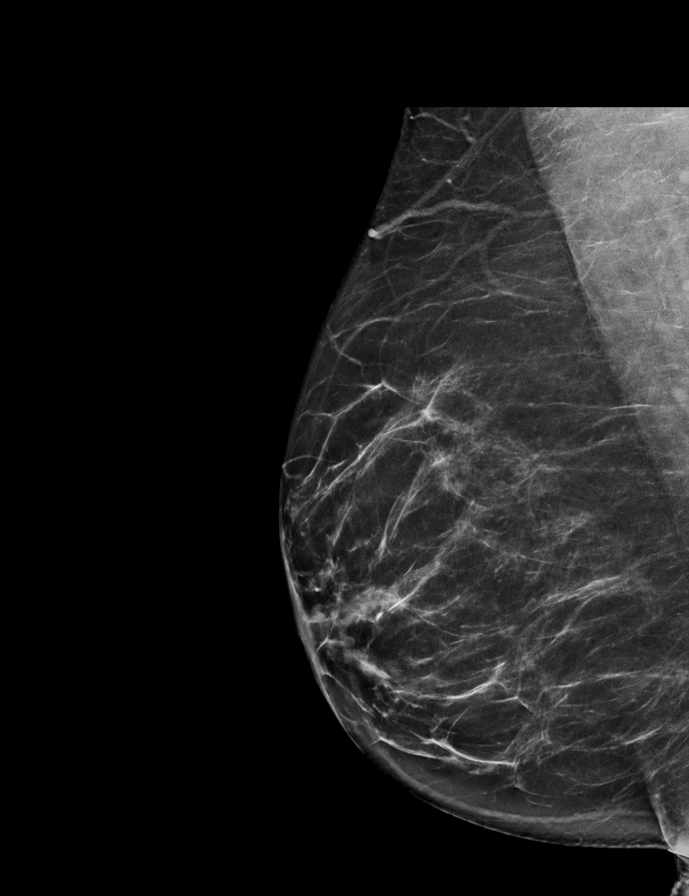

[L CC synth-2D]
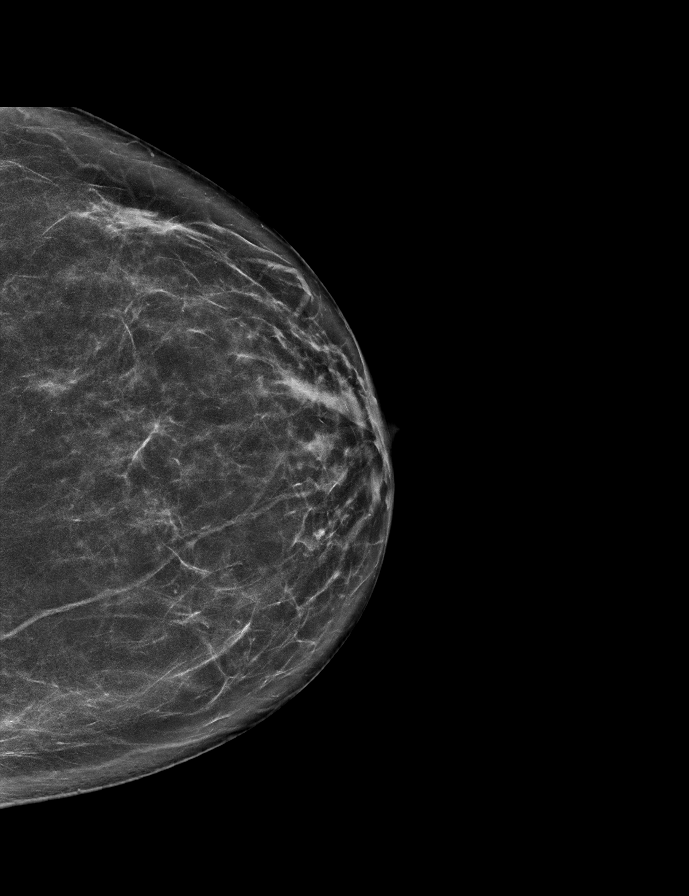

[R MLO tomo · 2 of 66 frames shown]
[frame 22/66]
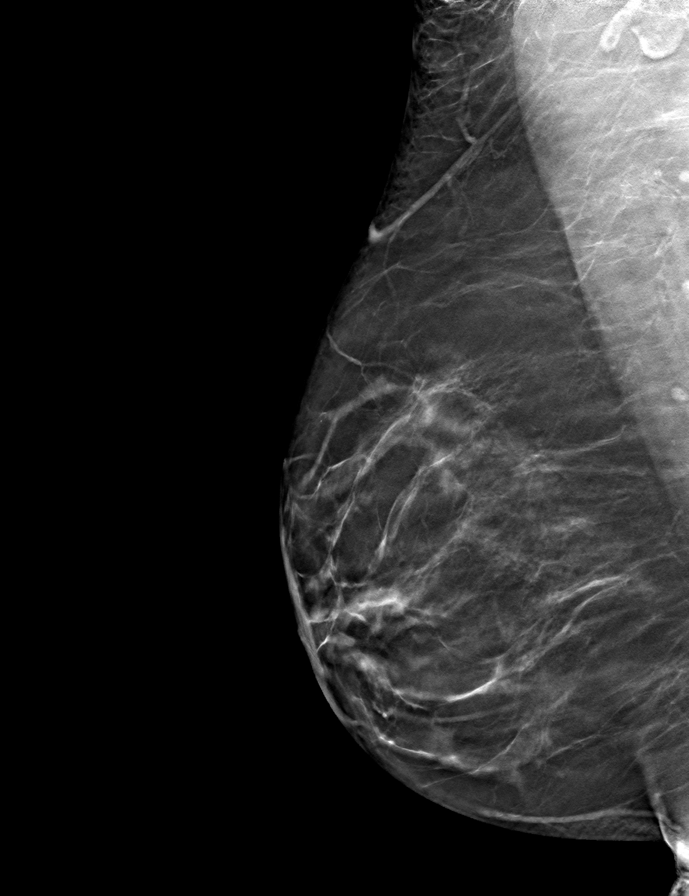
[frame 33/66]
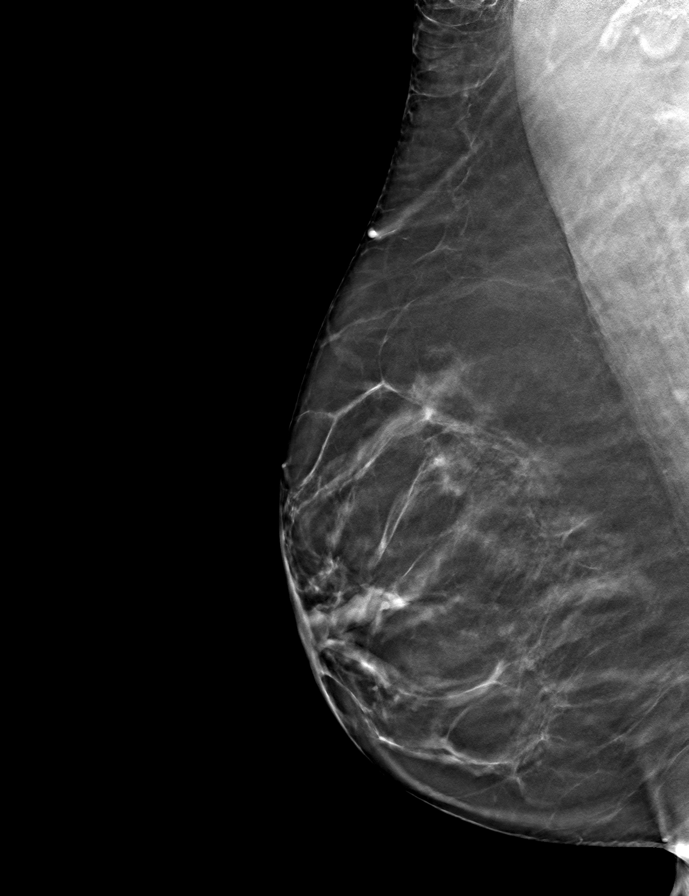

[L MLO tomo · tomo slice 35/69.0]
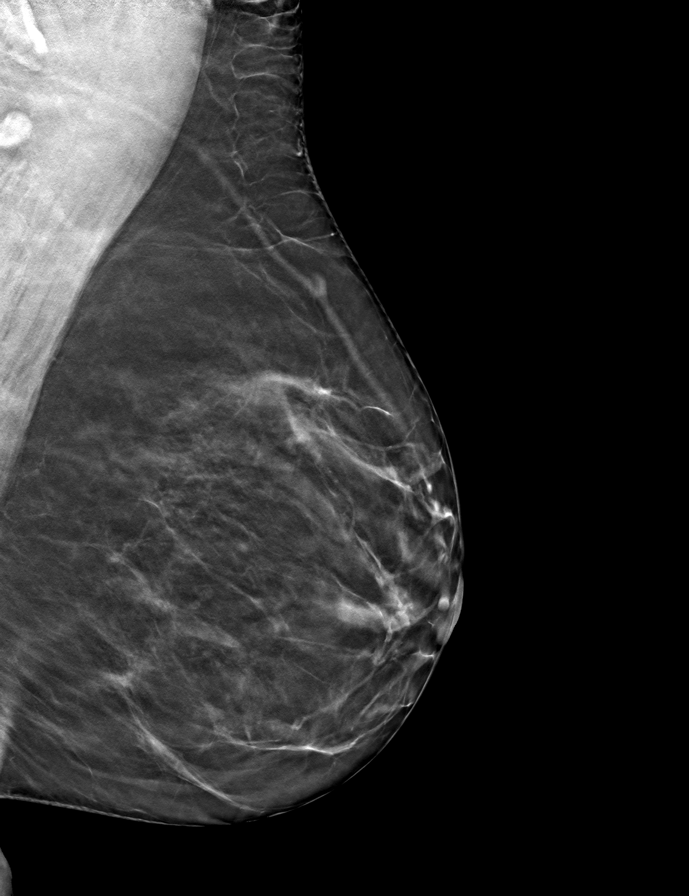

[L CC tomo · tomo slice 34/67.0]
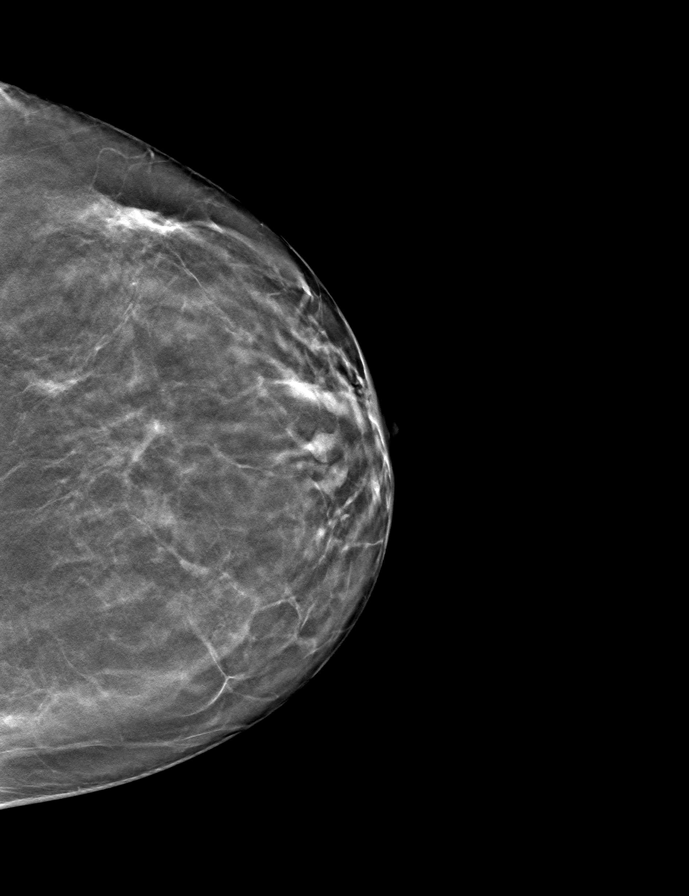

[R CC tomo · tomo slice 33/66.0]
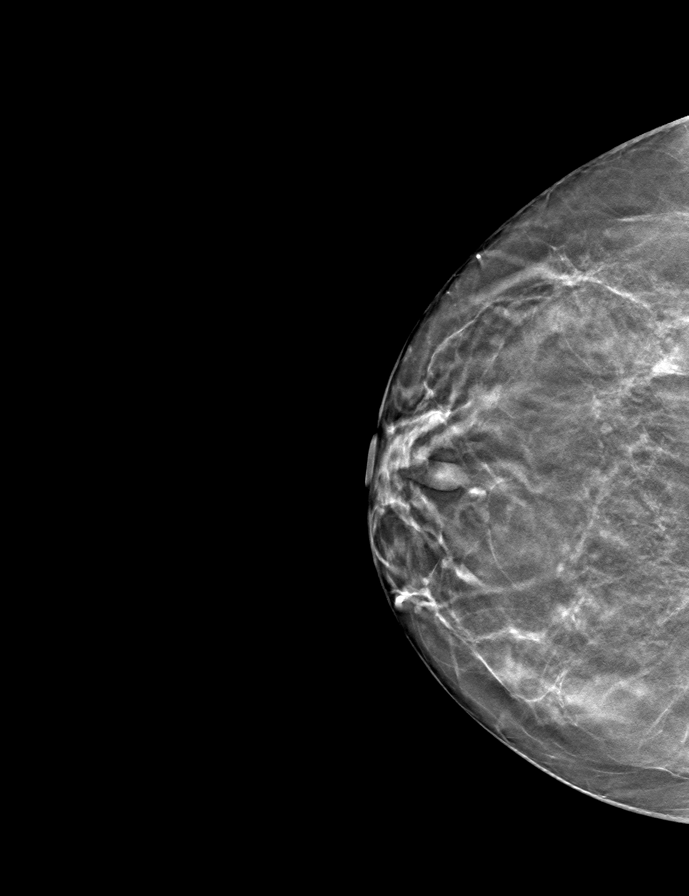

[9 of 24 positions shown; findings below may reference images not displayed]

ACR Breast Density Category b: There are scattered areas of
fibroglandular density.
FINDINGS: There are no findings suspicious for malignancy. Images were
processed with CAD.
IMPRESSION: No mammographic evidence of malignancy. A result letter of this
screening mammogram will be mailed directly to the patient.

RECOMMENDATION:
Screening mammogram in one year. (Code:[id number])

BI-RADS CATEGORY  1: Negative.

## 2022-06-14 ENCOUNTER — Ambulatory Visit (INDEPENDENT_AMBULATORY_CARE_PROVIDER_SITE_OTHER): Payer: PPO | Admitting: Gastroenterology

## 2022-06-14 ENCOUNTER — Encounter: Payer: Self-pay | Admitting: Gastroenterology

## 2022-06-14 ENCOUNTER — Other Ambulatory Visit (HOSPITAL_COMMUNITY): Payer: Self-pay

## 2022-06-14 VITALS — BP 142/102 | HR 97 | Ht 65.0 in | Wt 173.0 lb

## 2022-06-14 DIAGNOSIS — Z950 Presence of cardiac pacemaker: Secondary | ICD-10-CM

## 2022-06-14 DIAGNOSIS — R195 Other fecal abnormalities: Secondary | ICD-10-CM | POA: Diagnosis not present

## 2022-06-14 MED ORDER — NA SULFATE-K SULFATE-MG SULF 17.5-3.13-1.6 GM/177ML PO SOLN
1.0000 | Freq: Once | ORAL | 0 refills | Status: AC
  Filled 2022-06-14: qty 354, 1d supply, fill #0

## 2022-06-14 NOTE — Patient Instructions (Signed)
You have been scheduled for a colonoscopy. Please follow written instructions given to you at your visit today.  Please pick up your prep supplies at the pharmacy within the next 1-3 days. If you use inhalers (even only as needed), please bring them with you on the day of your procedure.  _______________________________________________________  If you are age 69 or older, your body mass index should be between 23-30. Your Body mass index is 28.79 kg/m. If this is out of the aforementioned range listed, please consider follow up with your Primary Care Provider.  If you are age 73 or younger, your body mass index should be between 19-25. Your Body mass index is 28.79 kg/m. If this is out of the aformentioned range listed, please consider follow up with your Primary Care Provider.   ________________________________________________________  The [facility] providers would like to encourage you to use [facility] to communicate with providers for non-urgent requests or questions.  Due to long hold times on the telephone, sending your provider a message by [facility] may be a faster and more efficient way to get a response.  Please allow 48 business hours for a response.  Please remember that this is for non-urgent requests.  _______________________________________________________

## 2022-06-14 NOTE — Progress Notes (Signed)
HPI : Katherine Yu is a very pleasant 71 year old female with a history of complete heart block status post permanent pacemaker placement who is referred to [place] by Dr. Waunita Schooner for positive fecal occult blood test.  The patient denies seeing any blood in her stool.  She has been doing regular FOBTs for CRC screening.  She has never had a colonoscopy. She denies any perianal symptoms of pain with passage of stool, perianal itching/burning. She has chronic problems with irregular bowel habits, going 2-4 times one day, then not going for 1-2 days.  Diarrhea has been a recurring problem this year because of frequent courses of antibiotics required for extensive dental work.  Diarrhea typically subsides after finishing the antibiotics.  No history of C Diff.  She denies abdominal pain.  She takes fiber and probiotics.  She has a history of symptomatic complete heart block and underwent a PPM placement last summer.  She has done well since then with no recurring symptoms.  She follows with cardiology regularly.  She is fairly active although she has to be careful because of her MS (fall risk).  [facility].  No known family history of colon cancer although she is adopted and does not know her family history well.   Past Medical History:  Diagnosis Date   ARTHRITIS 02/20/2009   Qualifier: Diagnosis of  By: Hester Mates, Carol     CHEST PAIN-PRECORDIAL 03/02/2009   Qualifier: Diagnosis of  By: Verl Blalock, MD, Estevan Oaks C    Chronic insomnia    Degenerative arthritis    DEPRESSION 02/20/2009   Qualifier: Diagnosis of  By: Ronne Binning     DEPRESSION 02/20/2009   Qualifier: Diagnosis of  By: Orville Govern CMA, Carol     Dyslipidemia    FATIGUE 02/20/2009   Qualifier: Diagnosis of  By: Orville Govern CMA, Carol     GERD 02/20/2009   Qualifier: Diagnosis of  By: Orville Govern CMA, Carol     History of lower leg fracture    History of peptic ulcer    History of seizures    In childhood    HYPERLIPIDEMIA-MIXED 05/04/2010   Qualifier: Diagnosis of  By: Verl Blalock, MD, Estevan Oaks C    Hypertension    Migraine    Mild obesity    MULTIPLE SCLEROSIS 02/20/2009   Qualifier: Diagnosis of  By: Orville Govern, CMA, Carol     Plantar fasciitis    Temporomandibular joint disease    Urinary incontinence   Complete Heart Block s/p PPM 2022   Past Surgical History:  Procedure Laterality Date   APPENDECTOMY     GALLBLADDER SURGERY     metal plate resection     skull   PACEMAKER IMPLANT N/A 04/19/2021   Procedure: PACEMAKER IMPLANT;  Surgeon: Vickie Epley, MD;  Location: [facility];  Service: Cardiovascular;  Laterality: N/A;   pilonidal cyst     Family History  Adopted: Yes  Problem Relation Age of Onset   Lupus Sister    Cancer Sister        bones, brain but not sure where it started   Heart attack Sister    Heart attack Sister    Breast cancer Neg Hx    Social History   Tobacco Use   Smoking status: Never   Smokeless tobacco: Never  Vaping Use   Vaping Use: Never used  Substance Use Topics   Alcohol use: No   Drug use: No  Current Outpatient Medications  Medication Sig Dispense Refill   aspirin 325 MG tablet Take 325 mg by mouth every 6 (six) hours as needed for mild pain or headache.     benazepril (LOTENSIN) 40 MG tablet TAKE 1 TABLET BY MOUTH ONCE DAILY. 90 tablet 1   Cholecalciferol (VITAMIN D3) 5000 UNITS TABS Take 5,000 Units by mouth daily.     famotidine (PEPCID) 20 MG tablet Take 20 mg by mouth daily as needed for heartburn or indigestion.     hydrochlorothiazide (HYDRODIURIL) 25 MG tablet Take 1 tablet  by mouth daily. 90 tablet 1   hydrOXYzine (ATARAX) 25 MG tablet Take 1 tablet (25 mg total) by mouth every 6 (six) hours as needed for itching. 30 tablet 0   ibuprofen (ADVIL) 200 MG tablet Take 400 mg by mouth every 6 (six) hours as needed for mild pain.     meclizine (ANTIVERT) 25 MG tablet Take 25 mg by mouth 3 (three) times daily as needed for  dizziness.     metoprolol succinate (TOPROL-XL) 50 MG 24 hr tablet Take 1 tablet (50 mg total) by mouth daily. Take with or immediately following a meal. 90 tablet 3   ondansetron (ZOFRAN-ODT) 8 MG disintegrating tablet Dissolve 1 tablet (8 mg total) by mouth every 8 (eight) hours as needed for nausea. 60 tablet 12   rizatriptan (MAXALT) 10 MG tablet Take 1 tablet by mouth as needed for migraine. May repeat in 2 hours if needed 10 tablet 5   No current facility-administered medications for this visit.   Allergies  Allergen Reactions   Niacin Anaphylaxis, Hives, Itching and Rash     Review of Systems: All systems reviewed and negative except where noted in HPI.    No results found.  Physical Exam: BP (!) 142/102   Pulse 97   Ht '5\' 5"'$  (1.651 m)   Wt 173 lb (78.5 kg)   BMI 28.79 kg/m  Constitutional: Pleasant,well-developed, Caucasian female in no acute distress. HEENT: Normocephalic and atraumatic. Conjunctivae are normal. No scleral icterus. Neck supple.  Cardiovascular: Normal rate, regular rhythm.  Pulmonary/chest: Effort normal and breath sounds normal. No wheezing, rales or rhonchi. Abdominal: Soft, nondistended, nontender. Bowel sounds active throughout. There are no masses palpable. No hepatomegaly. Extremities: no edema Neurological: Alert and oriented to person place and time.  Slightly unsteady going from chair to exam table Skin: Skin is warm and dry. No rashes noted. Psychiatric: Normal mood and affect. Behavior is normal.  CBC    Component Value Date/Time   WBC 5.3 03/17/2022 1133   WBC 3.9 (L) 04/19/2021 1130   RBC 5.36 (H) 03/17/2022 1133   RBC 5.28 (H) 04/19/2021 1130   HGB 16.3 (H) 03/17/2022 1133   HCT 47.7 (H) 03/17/2022 1133   PLT 234 03/17/2022 1133   MCV 89 03/17/2022 1133   MCH 30.4 03/17/2022 1133   MCH 30.1 04/19/2021 1130   MCHC 34.2 03/17/2022 1133   MCHC 33.2 04/19/2021 1130   RDW 13.0 03/17/2022 1133   LYMPHSABS 0.9 03/17/2022 1133    EOSABS 0.1 03/17/2022 1133   BASOSABS 0.0 03/17/2022 1133    CMP     Component Value Date/Time   NA 140 04/28/2022 0840   NA 144 07/28/2021 1426   K 3.7 04/28/2022 0840   CL 102 04/28/2022 0840   CO2 30 04/28/2022 0840   GLUCOSE 111 (H) 04/28/2022 0840   BUN 22 04/28/2022 0840   BUN 22 07/28/2021 1426   CREATININE 1.00 04/28/2022  0840   CALCIUM 10.0 04/28/2022 0840   PROT 7.6 04/28/2022 0840   PROT 7.3 07/28/2021 1426   ALBUMIN 4.6 04/28/2022 0840   ALBUMIN 4.9 (H) 07/28/2021 1426   AST 18 04/28/2022 0840   ALT 15 04/28/2022 0840   ALKPHOS 56 04/28/2022 0840   BILITOT 0.4 04/28/2022 0840   BILITOT <0.2 07/28/2021 1426   GFRNONAA 58 (L) 04/19/2021 1130   GFRAA 73 10/31/2019 1107   Component Ref Range & Units 1 mo ago  Fecal Occult Bld Negative Positive Abnormal     ASSESSMENT AND PLAN: 71 year old female with history of CHB s/p PPM and MS, with positive FOBT.  She has chronically irregular bowel habits, but no other chronic GI symptoms and no overt GI blood loss.  Will schedule for colonoscopy.  Pos FOBT - Colonoscopy  The details, risks (including bleeding, perforation, infection, missed lesions, medication reactions and possible hospitalization or surgery if complications occur), benefits, and alternatives to colonoscopy with possible biopsy and possible polypectomy were discussed with the patient and she consents to proceed.   Arlisha Patalano E. Candis Schatz, MD [facility]   CC:  Lesleigh Noe, MD

## 2022-06-15 ENCOUNTER — Other Ambulatory Visit: Payer: Self-pay | Admitting: Neurology

## 2022-06-16 ENCOUNTER — Encounter: Payer: Self-pay | Admitting: Family

## 2022-06-16 ENCOUNTER — Other Ambulatory Visit (HOSPITAL_COMMUNITY): Payer: Self-pay

## 2022-06-16 ENCOUNTER — Other Ambulatory Visit: Payer: Self-pay | Admitting: Neurology

## 2022-06-16 ENCOUNTER — Ambulatory Visit
Admission: RE | Admit: 2022-06-16 | Discharge: 2022-06-16 | Disposition: A | Discharge status: Home / Self Care | Payer: PPO | Source: Ambulatory Visit | Attending: Obstetrics and Gynecology | Admitting: Obstetrics and Gynecology

## 2022-06-16 DIAGNOSIS — Z1231 Encounter for screening mammogram for malignant neoplasm of breast: Secondary | ICD-10-CM

## 2022-06-17 ENCOUNTER — Other Ambulatory Visit: Payer: Self-pay | Admitting: Obstetrics and Gynecology

## 2022-06-17 ENCOUNTER — Other Ambulatory Visit (HOSPITAL_COMMUNITY): Payer: Self-pay

## 2022-06-17 DIAGNOSIS — R928 Other abnormal and inconclusive findings on diagnostic imaging of breast: Secondary | ICD-10-CM

## 2022-06-17 MED ORDER — HYDROXYZINE HCL 25 MG PO TABS
25.0000 mg | ORAL_TABLET | Freq: Four times a day (QID) | ORAL | 0 refills | Status: DC | PRN
  Filled 2022-06-17: qty 30, 8d supply, fill #0

## 2022-06-20 ENCOUNTER — Ambulatory Visit: Payer: PPO | Admitting: Podiatry

## 2022-06-20 ENCOUNTER — Other Ambulatory Visit (HOSPITAL_COMMUNITY): Payer: Self-pay

## 2022-06-20 ENCOUNTER — Encounter: Payer: Self-pay | Admitting: Podiatry

## 2022-06-20 DIAGNOSIS — L602 Onychogryphosis: Secondary | ICD-10-CM

## 2022-06-20 DIAGNOSIS — L84 Corns and callosities: Secondary | ICD-10-CM | POA: Diagnosis not present

## 2022-06-20 DIAGNOSIS — G35 Multiple sclerosis: Secondary | ICD-10-CM | POA: Diagnosis not present

## 2022-06-20 DIAGNOSIS — M792 Neuralgia and neuritis, unspecified: Secondary | ICD-10-CM | POA: Diagnosis not present

## 2022-06-20 DIAGNOSIS — E118 Type 2 diabetes mellitus with unspecified complications: Secondary | ICD-10-CM

## 2022-06-20 DIAGNOSIS — Q828 Other specified congenital malformations of skin: Secondary | ICD-10-CM | POA: Diagnosis not present

## 2022-06-20 NOTE — Progress Notes (Unsigned)
ANNUAL DIABETIC FOOT EXAM  Subjective: Katherine Yu presents today for annual diabetic foot examination.  Chief Complaint  Patient presents with   Nail Problem    Diabetic foot care BS-do not take A1C-6.? PCP-Jessica Cody PCP VST-9monthago     Patient confirms h/o diabetes.  Patient relates {Numbers; 0-100:15068} year h/o diabetes.  Patient denies any h/o foot wounds.  Patient has h/o foot ulcer of {jgPodToeLocator:23637}, which healed via help of ***.  Patient admits symptoms of foot numbness.   Patient admits symptoms of foot tingling.  Patient admits symptoms of burning in feet.  Patient admits symptoms of pins/needles sensation in feet.  Patient denies any numbness, tingling, burning, or pins/needle sensation in feet.  Patient has been diagnosed with neuropathy and it is managed with {JGNEUROPATHYMEDS:27053}.  Risk factors: {jgriskfactors:24044}.  CWaunita Schooner MD is patient's PCP. Last visit was {Time; dates multiple:15870}***.  Past Medical History:  Diagnosis Date   ARTHRITIS 02/20/2009   Qualifier: Diagnosis of  By: FHester Mates Carol     CHEST PAIN-PRECORDIAL 03/02/2009   Qualifier: Diagnosis of  By: WVerl Blalock MD, FEstevan OaksC    Chronic insomnia    Degenerative arthritis    DEPRESSION 02/20/2009   Qualifier: Diagnosis of  By: FHester Mates Carol     DEPRESSION 02/20/2009   Qualifier: Diagnosis of  By: FOrville Govern CMA, Carol     Dyslipidemia    FATIGUE 02/20/2009   Qualifier: Diagnosis of  By: FOrville GovernCMA, Carol     GERD 02/20/2009   Qualifier: Diagnosis of  By: FOrville Govern CMA, Carol     History of lower leg fracture    History of peptic ulcer    History of seizures    In childhood   HYPERLIPIDEMIA-MIXED 05/04/2010   Qualifier: Diagnosis of  By: WVerl Blalock MD, FEstevan OaksC    Hypertension    Migraine    Mild obesity    MULTIPLE SCLEROSIS 02/20/2009   Qualifier: Diagnosis of  By: FOrville Govern CMA, Carol     Plantar fasciitis    Temporomandibular joint disease     Urinary incontinence    Patient Active Problem List   Diagnosis Date Noted   Statin myopathy 04/28/2022   Mass of lower inner quadrant of left breast 03/21/2022   Urinary urgency 03/17/2022   Allergy to poison ivy 10/29/2021   Complete heart block ([facility] 04/19/2021   Type 2 diabetes mellitus without complication, without long-term current use of insulin ([place] 04/15/2021   Palpitations 04/15/2021   Gait disorder 11/04/2020   Urge incontinence 10/14/2020   Midline cystocele 09/08/2020   Uterine prolapse 09/08/2020   Common migraine 10/15/2014   Hyperlipidemia 05/04/2010   CHEST PAIN-PRECORDIAL 03/02/2009   Multiple sclerosis ([place] 02/20/2009   Essential hypertension 02/20/2009   GERD 02/20/2009   ARTHRITIS 02/20/2009   DEGENERATIVE [place]EASE 02/20/2009   Other fatigue 02/20/2009   DYSPNEA ON EXERTION 02/20/2009   Past Surgical History:  Procedure Laterality Date   APPENDECTOMY     GALLBLADDER SURGERY     metal plate resection     skull   PACEMAKER IMPLANT N/A 04/19/2021   Procedure: PACEMAKER IMPLANT;  Surgeon: LVickie Epley MD;  [facility];  Service: Cardiovascular;  Laterality: N/A;   pilonidal cyst     Current Outpatient Medications on File Prior to Visit  Medication Sig Dispense Refill   aspirin 325 MG tablet Take 325 mg by mouth every 6 (six) hours as needed for mild pain or headache.  benazepril (LOTENSIN) 40 MG tablet TAKE 1 TABLET BY MOUTH ONCE DAILY. 90 tablet 1   famotidine (PEPCID) 20 MG tablet Take 20 mg by mouth daily as needed for heartburn or indigestion.     hydrochlorothiazide (HYDRODIURIL) 25 MG tablet Take 1 tablet  by mouth daily. 90 tablet 1   hydrOXYzine (ATARAX) 25 MG tablet Take 1 tablet (25 mg total) by mouth every 6 (six) hours as needed for itching. 30 tablet 0   ibuprofen (ADVIL) 200 MG tablet Take 400 mg by mouth every 6 (six) hours as needed for mild pain.     meclizine (ANTIVERT) 25 MG tablet Take 25 mg by mouth 3  (three) times daily as needed for dizziness.     metoprolol succinate (TOPROL-XL) 50 MG 24 hr tablet Take 1 tablet (50 mg total) by mouth daily. Take with or immediately following a meal. 90 tablet 3   ondansetron (ZOFRAN-ODT) 8 MG disintegrating tablet Dissolve 1 tablet (8 mg total) by mouth every 8 (eight) hours as needed for nausea. 60 tablet 12   rizatriptan (MAXALT) 10 MG tablet Take 1 tablet by mouth as needed for migraine. May repeat in 2 hours if needed 10 tablet 5   No current facility-administered medications on file prior to visit.    Allergies  Allergen Reactions   Niacin Anaphylaxis, Hives, Itching and Rash   Niacin And Related Anaphylaxis   Social History   Occupational History   Occupation: Equities trader   Occupation: retired Marine scientist  Tobacco Use   Smoking status: Never   Smokeless tobacco: Never  Vaping Use   Vaping Use: Never used  Substance and Sexual Activity   Alcohol use: No   Drug use: No   Sexual activity: Not Currently   Family History  Adopted: Yes  Problem Relation Age of Onset   Lupus Sister    Cancer Sister        bones, brain but not sure where it started   Heart attack Sister    Heart attack Sister    Breast cancer Neg Hx    Colon cancer Neg Hx    Esophageal cancer Neg Hx    Stomach cancer Neg Hx    Immunization History  Administered Date(s) Administered   PFIZER(Purple Top)SARS-COV-2 Vaccination 11/15/2019, 12/09/2019, 06/15/2020, 12/29/2020   Pneumococcal Polysaccharide-23 09/24/2019   Zoster Recombinat (Shingrix) 01/24/2020, 04/02/2020     Review of Systems: Negative except as noted in the HPI.   Objective: There were no vitals filed for this visit.  Katherine Yu is a pleasant 71 y.o. female in NAD. AAO X 3.  Vascular Examination: {jgvascular:23595}  Dermatological Examination: {jgderm:23598}  Neurological Examination: {jgneuro:23601::"Protective sensation intact 5/5 intact bilaterally with 10g monofilament  b/l.","Vibratory sensation intact b/l.","Proprioception intact bilaterally."}  Musculoskeletal Examination: {jgmsk:23600}  Footwear Assessment: Does the patient wear appropriate shoes? {Yes,No}. Does the patient need inserts/orthotics? {Yes,No}.  ADA Risk Categorization: Low Risk :  Patient has all of the following: Intact protective sensation No prior foot ulcer  No severe deformity Pedal pulses present  High Risk  Patient has one or more of the following: Loss of protective sensation Absent pedal pulses Severe Foot deformity History of foot ulcer  Assessment: No diagnosis found.   Plan: {jgplan:23602::"-Patient/POA to call should there be question/concern in the interim."} Return in about 3 months (around 09/20/2022).  Marzetta Board, DPM

## 2022-06-27 ENCOUNTER — Ambulatory Visit (AMBULATORY_SURGERY_CENTER): Payer: PPO | Admitting: Gastroenterology

## 2022-06-27 ENCOUNTER — Encounter: Payer: Self-pay | Admitting: Gastroenterology

## 2022-06-27 VITALS — BP 144/86 | HR 75 | Temp 98.2°F | Resp 17 | Ht 65.0 in | Wt 173.0 lb

## 2022-06-27 DIAGNOSIS — R195 Other fecal abnormalities: Secondary | ICD-10-CM

## 2022-06-27 DIAGNOSIS — K5730 Diverticulosis of large intestine without perforation or abscess without bleeding: Secondary | ICD-10-CM

## 2022-06-27 DIAGNOSIS — D122 Benign neoplasm of ascending colon: Secondary | ICD-10-CM

## 2022-06-27 DIAGNOSIS — D124 Benign neoplasm of descending colon: Secondary | ICD-10-CM

## 2022-06-27 MED ORDER — SODIUM CHLORIDE 0.9 % IV SOLN: 500.0000 mL | Freq: Once | INTRAVENOUS | Status: DC

## 2022-06-27 NOTE — Progress Notes (Signed)
To pacu, VSS. Report to Rn.tb 

## 2022-06-27 NOTE — Progress Notes (Signed)
Pt's states no medical or surgical changes since previsit or office visit. VS assessed by D.T 

## 2022-06-27 NOTE — Patient Instructions (Signed)
Resume previous diet and medications. Awaiting pathology results. Repeat Colonoscopy date to be determined based on pathology results.  YOU HAD AN ENDOSCOPIC PROCEDURE TODAY AT THE [facility]:   Refer to the procedure report that was given to you for any specific questions about what was found during the examination.  If the procedure report does not answer your questions, please call your gastroenterologist to clarify.  If you requested that your care partner not be given the details of your procedure findings, then the procedure report has been included in a sealed envelope for you to review at your convenience later.  YOU SHOULD EXPECT: Some feelings of bloating in the abdomen. Passage of more gas than usual.  Walking can help get rid of the air that was put into your GI tract during the procedure and reduce the bloating. If you had a lower endoscopy (such as a colonoscopy or flexible sigmoidoscopy) you may notice spotting of blood in your stool or on the toilet paper. If you underwent a bowel prep for your procedure, you may not have a normal bowel movement for a few days.  Please Note:  You might notice some irritation and congestion in your nose or some drainage.  This is from the oxygen used during your procedure.  There is no need for concern and it should clear up in a day or so.  SYMPTOMS TO REPORT IMMEDIATELY:  Following lower endoscopy (colonoscopy or flexible sigmoidoscopy):  Excessive amounts of blood in the stool  Significant tenderness or worsening of abdominal pains  Swelling of the abdomen that is new, acute  Fever of 100F or higher   For urgent or emergent issues, a gastroenterologist can be reached at any hour by calling [phone]. Do not use MyChart messaging for urgent concerns.    DIET:  We do recommend a small meal at first, but then you may proceed to your regular diet.  Drink plenty of fluids but you should avoid alcoholic beverages for 24  hours.  ACTIVITY:  You should plan to take it easy for the rest of today and you should NOT DRIVE or use heavy machinery until tomorrow (because of the sedation medicines used during the test).    FOLLOW UP: Our staff will call the number listed on your records the next business day following your procedure.  We will call around 7:15- 8:00 am to check on you and address any questions or concerns that you may have regarding the information given to you following your procedure. If we do not reach you, we will leave a message.     If any biopsies were taken you will be contacted by phone or by letter within the next 1-3 weeks.  Please call us at [phone] if you have not heard about the biopsies in 3 weeks.    SIGNATURES/CONFIDENTIALITY: You and/or your care partner have signed paperwork which will be entered into your electronic medical record.  These signatures attest to the fact that that the information above on your After Visit Summary has been reviewed and is understood.  Full responsibility of the confidentiality of this discharge information lies with you and/or your care-partner. 

## 2022-06-27 NOTE — Progress Notes (Signed)
Pt's states no medical or surgical changes since previsit or office visit. VS assessed by C.W 

## 2022-06-27 NOTE — Op Note (Signed)
[facility] Patient Name: Katherine Yu Procedure Date: 06/27/2022 11:41 AM MRN: [id number] Endoscopist: Nicki Reaper E. Candis Schatz , MD Age: 71 Referring MD:  Date of Birth: 10-17-1950 Gender: Female Account #: [id number] Procedure:                Colonoscopy Indications:              Positive fecal immunochemical test Medicines:                Monitored Anesthesia Care Procedure:                Pre-Anesthesia Assessment:                           - Prior to the procedure, a History and Physical                            was performed, and patient medications and                            allergies were reviewed. The patient's tolerance of                            previous anesthesia was also reviewed. The risks                            and benefits of the procedure and the sedation                            options and risks were discussed with the patient.                            All questions were answered, and informed consent                            was obtained. Prior Anticoagulants: The patient has                            taken no previous anticoagulant or antiplatelet                            agents. ASA Grade Assessment: II - A patient with                            mild systemic disease. After reviewing the risks                            and benefits, the patient was deemed in                            satisfactory condition to undergo the procedure.                           After obtaining informed consent, the colonoscope  was passed under direct vision. Throughout the                            procedure, the patient's blood pressure, pulse, and                            oxygen saturations were monitored continuously. The                            Colonoscope was introduced through the anus and                            advanced to the the cecum, identified by                            appendiceal orifice and  ileocecal valve. The                            colonoscopy was performed without difficulty. The                            patient tolerated the procedure well. The quality                            of the bowel preparation was adequate. The                            ileocecal valve, appendiceal orifice, and rectum                            were photographed. The bowel preparation used was                            SUPREP via split dose instruction. Scope In: 32:95:18 AM Scope Out: 12:05:22 PM Scope Withdrawal Time: 0 hours 14 minutes 33 seconds  Total Procedure Duration: 0 hours 18 minutes 41 seconds  Findings:                 The perianal and digital rectal examinations were                            normal. Pertinent negatives include normal                            sphincter tone and no palpable rectal lesions.                           A few small-mouthed diverticula were found in the                            sigmoid colon and descending colon.                           A 4 mm polyp was found in the ascending colon. The  polyp was sessile. The polyp was removed with a                            cold snare. Resection and retrieval were complete.                            Estimated blood loss was minimal.                           A 2 mm polyp was found in the descending colon. The                            polyp was sessile. The polyp was removed with a                            jumbo cold forceps. Resection and retrieval were                            complete. Estimated blood loss was minimal.                           The exam was otherwise normal throughout the                            examined colon.                           Non-bleeding internal hemorrhoids were found during                            retroflexion. The hemorrhoids were Grade I                            (internal hemorrhoids that do not prolapse).                            No additional abnormalities were found on                            retroflexion. Complications:            No immediate complications. Estimated Blood Loss:     Estimated blood loss was minimal. Impression:               - Diverticulosis in the sigmoid colon and in the                            descending colon.                           - One 4 mm polyp in the ascending colon, removed                            with a cold snare. Resected and retrieved.                           -  One 2 mm polyp in the descending colon, removed                            with a jumbo cold forceps. Resected and retrieved.                           - Non-bleeding internal hemorrhoids. Suspect this                            was the source of the patient's positive FIT test. Recommendation:           - Patient has a contact number available for                            emergencies. The signs and symptoms of potential                            delayed complications were discussed with the                            patient. Return to normal activities tomorrow.                            Written discharge instructions were provided to the                            patient.                           - Resume previous diet.                           - Continue present medications.                           - Await pathology results.                           - Repeat colonoscopy (date not yet determined) for                            surveillance based on pathology results. Lashea Goda E. Candis Schatz, MD 06/27/2022 12:10:20 PM This report has been signed electronically.

## 2022-06-27 NOTE — Progress Notes (Signed)
History and Physical Interval Note:  06/27/2022 11:40 AM  Katherine Yu  has presented today for endoscopic procedure(s), with the diagnosis of  Encounter Diagnosis  Name Primary?   Positive fecal occult blood test Yes  .  The various methods of evaluation and treatment have been discussed with the patient and/or family. After consideration of risks, benefits and other options for treatment, the patient has consented to  the endoscopic procedure(s).   The patient's history has been reviewed, patient examined, no change in status, stable for endoscopic procedure(s).  I have reviewed the patient's chart and labs.  Questions were answered to the patient's satisfaction.     Donelle Hise E. Candis Schatz, MD [facility]

## 2022-06-28 ENCOUNTER — Telehealth: Payer: Self-pay

## 2022-06-28 NOTE — Telephone Encounter (Signed)
  Follow up Call-     06/27/2022   10:44 AM  Call back number  Post procedure Call Back phone  # [phone]  Permission to leave phone message Yes     Patient questions:  Do you have a fever, pain , or abdominal swelling? No. Pain Score  0 *  Have you tolerated food without any problems? Yes.    Have you been able to return to your normal activities? Yes.    Do you have any questions about your discharge instructions: Diet   No. Medications  No. Follow up visit  No.  Do you have questions or concerns about your Care? No.  Actions: * If pain score is 4 or above: No action needed, pain <4.

## 2022-06-29 ENCOUNTER — Ambulatory Visit
Admission: RE | Admit: 2022-06-29 | Discharge: 2022-06-29 | Disposition: A | Discharge status: Home / Self Care | Payer: PPO | Source: Ambulatory Visit | Attending: Obstetrics and Gynecology | Admitting: Obstetrics and Gynecology

## 2022-06-29 ENCOUNTER — Ambulatory Visit: Payer: PPO

## 2022-06-29 DIAGNOSIS — R928 Other abnormal and inconclusive findings on diagnostic imaging of breast: Secondary | ICD-10-CM

## 2022-06-30 NOTE — Progress Notes (Signed)
Katherine Yu, Two polyps which I removed during your recent procedure were proven to be completely benign but are considered "pre-cancerous" polyps that MAY have grown into cancer if they had not been removed.  Studies shows that at least 20% of women over age 69 and 30% of men over age 27 have pre-cancerous polyps.  Based on current nationally recognized surveillance guidelines, I recommend that you have a repeat colonoscopy in 7 years.   As you would be 71 years old by the time your next colonoscopy would be due and colon cancer screening is not universally recommended after age 2, I recommend you make an appointment with me in the office at that time to discuss whether further colon cancer screening is right for you.

## 2022-07-25 ENCOUNTER — Ambulatory Visit (INDEPENDENT_AMBULATORY_CARE_PROVIDER_SITE_OTHER): Payer: PPO

## 2022-07-25 DIAGNOSIS — I442 Atrioventricular block, complete: Secondary | ICD-10-CM | POA: Diagnosis not present

## 2022-07-26 LAB — CUP PACEART REMOTE DEVICE CHECK
Battery Remaining Longevity: 116 mo
Battery Voltage: 3.02 V
Brady Statistic AP VP Percent: 0.09 %
Brady Statistic AP VS Percent: 0 %
Brady Statistic AS VP Percent: 99.9 %
Brady Statistic AS VS Percent: 0.01 %
Brady Statistic RA Percent Paced: 0.09 %
Brady Statistic RV Percent Paced: 99.99 %
Date Time Interrogation Session: 20231119195810
Implantable Lead Connection Status: 753985
Implantable Lead Connection Status: 753985
Implantable Lead Implant Date: 20220815
Implantable Lead Implant Date: 20220815
Implantable Lead Location: 753859
Implantable Lead Location: 753860
Implantable Lead Model: 3830
Implantable Lead Model: 5076
Implantable Pulse Generator Implant Date: 20220815
Lead Channel Impedance Value: 323 Ohm
Lead Channel Impedance Value: 361 Ohm
Lead Channel Impedance Value: 456 Ohm
Lead Channel Impedance Value: 608 Ohm
Lead Channel Pacing Threshold Amplitude: 0.625 V
Lead Channel Pacing Threshold Amplitude: 1.375 V
Lead Channel Pacing Threshold Pulse Width: 0.4 ms
Lead Channel Pacing Threshold Pulse Width: 0.4 ms
Lead Channel Sensing Intrinsic Amplitude: 3.875 mV
Lead Channel Sensing Intrinsic Amplitude: 3.875 mV
Lead Channel Sensing Intrinsic Amplitude: 5.5 mV
Lead Channel Sensing Intrinsic Amplitude: 5.5 mV
Lead Channel Setting Pacing Amplitude: 1.5 V
Lead Channel Setting Pacing Amplitude: 2.75 V
Lead Channel Setting Pacing Pulse Width: 0.4 ms
Lead Channel Setting Sensing Sensitivity: 0.9 mV
Zone Setting Status: 755011

## 2022-07-30 ENCOUNTER — Other Ambulatory Visit: Payer: Self-pay | Admitting: Neurology

## 2022-08-03 ENCOUNTER — Encounter: Payer: Self-pay | Admitting: Cardiology

## 2022-08-04 ENCOUNTER — Other Ambulatory Visit: Payer: Self-pay | Admitting: Neurology

## 2022-08-04 ENCOUNTER — Other Ambulatory Visit (HOSPITAL_COMMUNITY): Payer: Self-pay

## 2022-08-04 MED ORDER — HYDROXYZINE HCL 25 MG PO TABS
25.0000 mg | ORAL_TABLET | Freq: Four times a day (QID) | ORAL | 0 refills | Status: DC | PRN
  Filled 2022-08-04: qty 30, 8d supply, fill #0

## 2022-08-04 NOTE — Telephone Encounter (Signed)
Pt called stating that her hydrOXYzine (ATARAX) 25 MG tablet has not been filled yet and she thinks it may be due to the Rx not having Dr. Garth Bigness name on it. She states that the request has been sent several times and she is down to her last pill now. Please advise.

## 2022-08-06 ENCOUNTER — Encounter: Payer: Self-pay | Admitting: Cardiology

## 2022-08-09 LAB — HM DIABETES EYE EXAM

## 2022-08-15 ENCOUNTER — Encounter: Payer: Self-pay | Admitting: Family

## 2022-08-15 ENCOUNTER — Other Ambulatory Visit (HOSPITAL_COMMUNITY): Payer: Self-pay

## 2022-08-20 ENCOUNTER — Other Ambulatory Visit: Payer: Self-pay

## 2022-08-30 ENCOUNTER — Other Ambulatory Visit: Payer: Self-pay | Admitting: Family

## 2022-08-30 ENCOUNTER — Encounter: Payer: Self-pay | Admitting: Family

## 2022-08-30 ENCOUNTER — Other Ambulatory Visit (HOSPITAL_COMMUNITY): Payer: Self-pay

## 2022-08-31 ENCOUNTER — Other Ambulatory Visit: Payer: Self-pay

## 2022-08-31 ENCOUNTER — Other Ambulatory Visit (HOSPITAL_COMMUNITY): Payer: Self-pay

## 2022-08-31 MED ORDER — BENAZEPRIL HCL 40 MG PO TABS
40.0000 mg | ORAL_TABLET | Freq: Every day | ORAL | 0 refills | Status: DC
  Filled 2022-08-31: qty 90, 90d supply, fill #0

## 2022-09-06 NOTE — Progress Notes (Signed)
Remote pacemaker transmission.   

## 2022-09-12 ENCOUNTER — Other Ambulatory Visit: Payer: Self-pay | Admitting: Neurology

## 2022-09-13 ENCOUNTER — Other Ambulatory Visit (HOSPITAL_COMMUNITY): Payer: Self-pay

## 2022-09-13 MED ORDER — HYDROXYZINE HCL 25 MG PO TABS
25.0000 mg | ORAL_TABLET | Freq: Four times a day (QID) | ORAL | 0 refills | Status: DC | PRN
  Filled 2022-09-13: qty 30, 8d supply, fill #0

## 2022-09-19 ENCOUNTER — Other Ambulatory Visit: Payer: Self-pay | Admitting: Cardiology

## 2022-09-19 DIAGNOSIS — R072 Precordial pain: Secondary | ICD-10-CM

## 2022-09-19 DIAGNOSIS — I10 Essential (primary) hypertension: Secondary | ICD-10-CM

## 2022-09-19 DIAGNOSIS — I442 Atrioventricular block, complete: Secondary | ICD-10-CM

## 2022-09-19 DIAGNOSIS — E118 Type 2 diabetes mellitus with unspecified complications: Secondary | ICD-10-CM

## 2022-09-20 ENCOUNTER — Ambulatory Visit (INDEPENDENT_AMBULATORY_CARE_PROVIDER_SITE_OTHER): Payer: PPO | Admitting: Podiatry

## 2022-09-20 ENCOUNTER — Telehealth: Payer: Self-pay | Admitting: Neurology

## 2022-09-20 ENCOUNTER — Encounter: Payer: Self-pay | Admitting: Neurology

## 2022-09-20 ENCOUNTER — Other Ambulatory Visit (HOSPITAL_COMMUNITY): Payer: Self-pay

## 2022-09-20 ENCOUNTER — Ambulatory Visit: Payer: PPO | Admitting: Neurology

## 2022-09-20 VITALS — BP 143/82

## 2022-09-20 VITALS — BP 137/92 | HR 96 | Ht 66.0 in | Wt 175.5 lb

## 2022-09-20 DIAGNOSIS — Z79899 Other long term (current) drug therapy: Secondary | ICD-10-CM

## 2022-09-20 DIAGNOSIS — L602 Onychogryphosis: Secondary | ICD-10-CM | POA: Diagnosis not present

## 2022-09-20 DIAGNOSIS — R269 Unspecified abnormalities of gait and mobility: Secondary | ICD-10-CM | POA: Diagnosis not present

## 2022-09-20 DIAGNOSIS — E119 Type 2 diabetes mellitus without complications: Secondary | ICD-10-CM

## 2022-09-20 DIAGNOSIS — G35 Multiple sclerosis: Secondary | ICD-10-CM

## 2022-09-20 DIAGNOSIS — R3915 Urgency of urination: Secondary | ICD-10-CM | POA: Diagnosis not present

## 2022-09-20 DIAGNOSIS — R5383 Other fatigue: Secondary | ICD-10-CM | POA: Diagnosis not present

## 2022-09-20 DIAGNOSIS — E118 Type 2 diabetes mellitus with unspecified complications: Secondary | ICD-10-CM

## 2022-09-20 MED ORDER — METOPROLOL SUCCINATE ER 50 MG PO TB24
50.0000 mg | ORAL_TABLET | Freq: Every day | ORAL | 2 refills | Status: DC
  Filled 2022-09-20: qty 30, 30d supply, fill #0
  Filled 2022-10-22: qty 30, 30d supply, fill #1
  Filled 2022-11-19: qty 30, 30d supply, fill #2

## 2022-09-20 NOTE — Progress Notes (Signed)
[facility]  PATIENT: Katherine Yu DOB: [birth year]  REFERRING DOCTOR OR PCP: Claris Gower (PCP) SOURCE: Patient, notes from Dr. Jannifer Franklin, imaging and lab reports, MRI images personally reviewed.  _________________________________   HISTORICAL  CHIEF COMPLAINT:  Chief Complaint  Patient presents with   Room 11    Pt is here Alone. Pt states no change with her MS. Pt states no new muscle weakness.     HISTORY OF PRESENT ILLNESS:  Katherine Yu is a 72 y.o. woman with MS and migraine  Update 09/20/2022 She was diagnosed with MS in 2004.     We stopped the Tecfidera (DMF) February 2023.     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   Gait is off balance.   .She uses the cane if outdoors.   She sees the rail on stairs.  She has a hand grab in the shower.   She denies weakness or numbness.   She notes on exercise equipment that the left leg is weaker even though she does not note with standard activity.   She uses a treadmill and the left foot sometimes catches.  She will be having laser surgery for glaucoma.    She denies diplopia or optic neuritis.   She is seeing an incontinence specialist (Dr. Zigmund Daniel, urogynecology,  [facility]) due to incontinence/leakage.   She does pelvic floor exercises bit not much benefit.   She had urodynamic.    She has not been on bladder medication but urology told her they would not help.   She denies hesitancy but does not empty and often needs to go back a couple times.    She notes some fatigue. This has not worsened.      She has insomnia associated with nocturia.  We discussed DDAVP in past but she prefers not to take.  Unsure if melatonin helps  She tries some CBT techniques with mixed benefit..   She has noted reduced focus/attention and reduced short-term memory.  This has not changed much over the past couple of years.  Vascular risks:    She was diagnosed  with Type 2 NIDDM in 2022 but had elevated HgBA1c for a few years.  She is doing diet control with HgbA1c 6.4 when checked a month ago.  She does not smoke.   She has controlled essential hypertension.   She does not have hyperlipidemia.     MS History: She was diagnosed with MS in 2004  She had intense headaches and had an MRI and stumbling     She was placed on Avonex but had some injection reactions,   She switched to Betaseron and did better.   She then switched to REbif (unclear why).   She was on Tysabri for several years.  Due to elevated JCV, she switched to one of her previous injectable medication sand then to glatiramer and then more recently to DMF.  She was switched due to more progression.  Specifically, she was having bladder dysfunction and memory issues and stumbling.     IMAGING: Brain MRI 01/30/2021 shows extensive single and confluent T2/FLAIR hyperintense foci in the hemispheres predominantly in the subcortical and deep white matter with large confluencies in the periatrial white matter and over the frontal horns.  Some foci are in the corpus callosum though but more are pericallosal.  There are no lesions in the infratentorial white matter.  Normal  enhancement pattern.  Cerv/thoracic spine MRI 05/18/2020 shows a normal spinal cord.  She does have multilevel degenerative changes but no significant spinal stenosis.  REVIEW OF SYSTEMS: Constitutional: No fevers, chills, sweats, or change in appetite Eyes: No visual changes, double vision, eye pain Ear, nose and throat: No hearing loss, ear pain, nasal congestion, sore throat Cardiovascular: No chest pain, palpitations.  She has a pacemaker Respiratory:  No shortness of breath at rest or with exertion.   No wheezes GastrointestinaI: No nausea, vomiting, diarrhea, abdominal pain, fecal incontinence Genitourinary:  she has urinary frequency, retention and nocturia.   Musculoskeletal:  Notes neck pain, and back pain Integumentary: No  rash,, skin lesions.  Notes itching Neurological: as above Psychiatric: No depression at this time.  No anxiety Endocrine: No palpitations, diaphoresis, change in appetite, change in weigh or increased thirst Hematologic/Lymphatic:  No anemia, purpura, petechiae. Allergic/Immunologic: No itchy/runny eyes, nasal congestion, recent allergic reactions, rashes  ALLERGIES: Allergies  Allergen Reactions   Niacin Anaphylaxis, Hives, Itching and Rash   Niacin And Related Anaphylaxis    HOME MEDICATIONS:  Current Outpatient Medications:    aspirin 325 MG tablet, Take 325 mg by mouth every 6 (six) hours as needed for mild pain or headache., Disp: , Rfl:    benazepril (LOTENSIN) 40 MG tablet, Take 1 tablet (40 mg total) by mouth daily., Disp: 90 tablet, Rfl: 0   Cholecalciferol (VITAMIN D3) 75 MCG (3000 UT) TABS, Take 2,000 mg by mouth daily., Disp: , Rfl:    famotidine (PEPCID) 20 MG tablet, Take 20 mg by mouth daily as needed for heartburn or indigestion., Disp: , Rfl:    hydrochlorothiazide (HYDRODIURIL) 25 MG tablet, Take 1 tablet  by mouth daily., Disp: 90 tablet, Rfl: 1   hydrOXYzine (ATARAX) 25 MG tablet, Take 1 tablet (25 mg total) by mouth every 6 (six) hours as needed for itching., Disp: 30 tablet, Rfl: 0   ibuprofen (ADVIL) 200 MG tablet, Take 400 mg by mouth every 6 (six) hours as needed for mild pain., Disp: , Rfl:    meclizine (ANTIVERT) 25 MG tablet, Take 25 mg by mouth 3 (three) times daily as needed for dizziness., Disp: , Rfl:    metoprolol succinate (TOPROL-XL) 50 MG 24 hr tablet, Take 1 tablet (50 mg total) by mouth daily. Take with or immediately following a meal., Disp: 90 tablet, Rfl: 3   ondansetron (ZOFRAN-ODT) 8 MG disintegrating tablet, Dissolve 1 tablet (8 mg total) by mouth every 8 (eight) hours as needed for nausea., Disp: 60 tablet, Rfl: 12   rizatriptan (MAXALT) 10 MG tablet, Take 1 tablet by mouth as needed for migraine. May repeat in 2 hours if needed, Disp: 10  tablet, Rfl: 5  PAST MEDICAL HISTORY: Past Medical History:  Diagnosis Date   ARTHRITIS 02/20/2009   Qualifier: Diagnosis of  By: Hester Mates, Carol     CHEST PAIN-PRECORDIAL 03/02/2009   Qualifier: Diagnosis of  By: Verl Blalock, MD, Estevan Oaks C    Chronic insomnia    Degenerative arthritis    DEPRESSION 02/20/2009   Qualifier: Diagnosis of  By: Ronne Binning     DEPRESSION 02/20/2009   Qualifier: Diagnosis of  By: Orville Govern CMA, Carol     Dyslipidemia    FATIGUE 02/20/2009   Qualifier: Diagnosis of  By: Orville Govern CMA, Carol     GERD 02/20/2009   Qualifier: Diagnosis of  By: Orville Govern CMA, Carol     History of lower leg fracture    History of  peptic ulcer    History of seizures    In childhood   HYPERLIPIDEMIA-MIXED 05/04/2010   Qualifier: Diagnosis of  By: Verl Blalock, MD, Estevan Oaks C    Hypertension    Migraine    Mild obesity    MULTIPLE SCLEROSIS 02/20/2009   Qualifier: Diagnosis of  By: Orville Govern, CMA, Carol     Plantar fasciitis    Temporomandibular joint disease    Urinary incontinence     PAST SURGICAL HISTORY: Past Surgical History:  Procedure Laterality Date   APPENDECTOMY     GALLBLADDER SURGERY     metal plate resection     skull   PACEMAKER IMPLANT N/A 04/19/2021   Procedure: PACEMAKER IMPLANT;  Surgeon: Vickie Epley, MD;  Location: [facility];  Service: Cardiovascular;  Laterality: N/A;   pilonidal cyst      FAMILY HISTORY: Family History  Adopted: Yes  Problem Relation Age of Onset   Lupus Sister    Cancer Sister        bones, brain but not sure where it started   Heart attack Sister    Heart attack Sister    Breast cancer Neg Hx    Colon cancer Neg Hx    Esophageal cancer Neg Hx    Stomach cancer Neg Hx     SOCIAL HISTORY:   Social History   Socioeconomic History   Marital status: Married    Spouse name: Mikeal Hawthorne   Number of children: 3   Years of education: Masters   Highest education level: Not on file  Occupational History   Occupation:  Equities trader   Occupation: retired Marine scientist  Tobacco Use   Smoking status: Never   Smokeless tobacco: Never  Vaping Use   Vaping Use: Never used  Substance and Sexual Activity   Alcohol use: No   Drug use: No   Sexual activity: Not Currently  Other Topics Concern   Not on file  Social History Narrative   Patient is right handed.   Patient drinks 1 cup caffeine daily.   Lives with husband and son.       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      Family: 3 children - Judson Roch, Marjory Lies (lives with her), Pearl (lives [place])      Enjoys: reading and walking, meditate      Exercise: walking, and home exercise routine   Diet: diabetic diet      Safety   Seat belts: Yes    Guns: Yes  and in gun safe   Safe in relationships: Yes       Social Determinants of Health   Financial Resource Strain: [place]  (09/27/2021)   Overall Financial Resource Strain (CARDIA)    Difficulty of Paying Living Expenses: Not hard at all  Food Insecurity: Not on file  Transportation Needs: No Transportation Needs (09/27/2021)   PRAPARE - Hydrologist (Medical): No    Lack of Transportation (Non-Medical): No  Physical Activity: Not on file  Stress: Not on file  Social Connections: Not on file  Intimate Partner Violence: Unknown (09/27/2021)   Humiliation, Afraid, Rape, and Kick questionnaire    Fear of Current or Ex-Partner: No    Emotionally Abused: Not on file    Physically Abused: Not on file  Sexually Abused: Not on file    Addendum: No partner violence.  No unmet transportation need.  Has gotten lost driving and does not drive at night  PHYSICAL EXAM  Vitals:   09/20/22 1033  BP: (!) 137/92  Pulse: 96  Weight: 175 lb 8 oz (79.6 kg)  Height: '5\' 6"'$  (1.676 m)    Body mass index is 28.33 kg/m.   General: The patient is well-developed and  well-nourished and in no acute distress  HEENT:  Head is [place]/AT.  Sclera are anicteric.    Skin: Extremities are without rash or  edema.  Neurologic Exam  Mental status: The patient is alert and oriented x 3 at the time of the examination. The patient has apparent normal recent and remote memory, with an apparently normal attention span and concentration ability.   Speech is normal.  Cranial nerves: Extraocular movements are full.  Facial strength and sensation was normal.. No obvious hearing deficits are noted.  Motor:  Muscle bulk is normal.   Tone is normal. Strength is  5 / 5 in all 4 extremities.   Sensory: Sensory testing is intact to pinprick, soft touch and vibration sensation in all 4 extremities except mild reduced vibration sensation at toes  Coordination: Cerebellar testing reveals good finger-nose-finger and mildly reduced heel-to-shin bilaterally.  Gait and station: Station is normal.  Her gait is wide but does not use cane.  The tandem gait is poor.  Romberg is negative.  Reflexes: Deep tendon reflexes are symmetric and normal in arms, 3+ at the knees.  No ankle clonus.       DIAGNOSTIC DATA (LABS, IMAGING, TESTING) - I reviewed patient records, labs, notes, testing and imaging myself where available.  Lab Results  Component Value Date   WBC 5.3 03/17/2022   HGB 16.3 (H) 03/17/2022   HCT 47.7 (H) 03/17/2022   MCV 89 03/17/2022   PLT 234 03/17/2022      Component Value Date/Time   NA 140 04/28/2022 0840   NA 144 07/28/2021 1426   K 3.7 04/28/2022 0840   CL 102 04/28/2022 0840   CO2 30 04/28/2022 0840   GLUCOSE 111 (H) 04/28/2022 0840   BUN 22 04/28/2022 0840   BUN 22 07/28/2021 1426   CREATININE 1.00 04/28/2022 0840   CALCIUM 10.0 04/28/2022 0840   PROT 7.6 04/28/2022 0840   PROT 7.3 07/28/2021 1426   ALBUMIN 4.6 04/28/2022 0840   ALBUMIN 4.9 (H) 07/28/2021 1426   AST 18 04/28/2022 0840   ALT 15 04/28/2022 0840   ALKPHOS 56 04/28/2022 0840   BILITOT  0.4 04/28/2022 0840   BILITOT <0.2 07/28/2021 1426   GFRNONAA 58 (L) 04/19/2021 1130   GFRAA 73 10/31/2019 1107   Lab Results  Component Value Date   CHOL 156 04/28/2022   HDL 42.40 04/28/2022   LDLCALC 87 04/28/2022   TRIG 130.0 04/28/2022   CHOLHDL 4 04/28/2022   Lab Results  Component Value Date   HGBA1C 6.0 (H) 03/17/2022   No results found for: "VITAMINB12" Lab Results  Component Value Date   TSH 2.880 07/28/2021       ASSESSMENT AND PLAN  Multiple sclerosis ([place]) - Plan: MR BRAIN W WO CONTRAST, Comprehensive metabolic panel, CBC with Differential/Platelet  Type 2 diabetes mellitus without complication, without long-term current use of insulin (HCC) - Plan: Hemoglobin A1c, Comprehensive metabolic panel, CBC with Differential/Platelet, TSH  Gait disorder  Urinary urgency  Other fatigue - Plan: TSH  High risk medication use -  Plan: Hemoglobin A1c, Comprehensive metabolic panel, CBC with Differential/Platelet, TSH    She will continue off dimethyl fumarate.  Her MS appears stable clinically. Check MRI of the brain to determine if any subclinical progression and start DMT if occurring. Continue Relpax for migraine. Return in 12 months or sooner if there are new or worsening neurologic symptoms.   Idamay Hosein A. Felecia Shelling, MD, [facility] 2/87/8676, 72:09 AM Certified in Neurology, Clinical Neurophysiology, Sleep Medicine and Neuroimaging  [facility] [address], [place], [place] [address] [phone]

## 2022-09-20 NOTE — Progress Notes (Signed)
ANNUAL DIABETIC FOOT EXAM  Subjective: Katherine Yu presents today for annual diabetic foot examination. She is taking Nervive Nerve Relief and using Nervive Roll On for her neuropathy symptoms. Also taking Melatonin 10 mg before bedtime.  She relates new problem of pain located on the outside of her right heel and feels she may develop a wound as at times the area is sore. She denies any redness, swelling or drainage.  Chief Complaint  Patient presents with   Nail Problem    DFC BS-do not check A1C-5.8% PCP-Dugal, Tabitha PCP VST-72month ago   Patient confirms h/o diabetes.  Patient relates 2 year h/o diabetes.  Patient denies any h/o foot wounds.  Patient endorses numbness, tingling, burning, or pins/needle sensation in feet secondary to MS.  Risk factors: diabetes, hyperlipidemia.  DEugenia Pancoast FNP is patient's PCP.   Past Medical History:  Diagnosis Date   ARTHRITIS 02/20/2009   Qualifier: Diagnosis of  By: FHester Mates Carol     CHEST PAIN-PRECORDIAL 03/02/2009   Qualifier: Diagnosis of  By: WVerl Blalock MD, FEstevan OaksC    Chronic insomnia    Degenerative arthritis    DEPRESSION 02/20/2009   Qualifier: Diagnosis of  By: FHester Mates Carol     DEPRESSION 02/20/2009   Qualifier: Diagnosis of  By: FOrville Govern CMA, Carol     Dyslipidemia    FATIGUE 02/20/2009   Qualifier: Diagnosis of  By: FOrville GovernCMA, Carol     GERD 02/20/2009   Qualifier: Diagnosis of  By: FOrville Govern CMA, Carol     History of lower leg fracture    History of peptic ulcer    History of seizures    In childhood   HYPERLIPIDEMIA-MIXED 05/04/2010   Qualifier: Diagnosis of  By: WVerl Blalock MD, FEstevan OaksC    Hypertension    Migraine    Mild obesity    MULTIPLE SCLEROSIS 02/20/2009   Qualifier: Diagnosis of  By: FOrville Govern CMA, Carol     Plantar fasciitis    Temporomandibular joint disease    Urinary incontinence    Patient Active Problem List   Diagnosis Date Noted   Statin myopathy 04/28/2022   Mass of lower inner  quadrant of left breast 03/21/2022   Urinary urgency 03/17/2022   Allergy to poison ivy 10/29/2021   Complete heart block ([place] 04/19/2021   Type 2 diabetes mellitus without complication, without long-term current use of insulin ([facility] 04/15/2021   Palpitations 04/15/2021   Gait disorder 11/04/2020   Urge incontinence 10/14/2020   Midline cystocele 09/08/2020   Uterine prolapse 09/08/2020   Common migraine 10/15/2014   Hyperlipidemia 05/04/2010   CHEST PAIN-PRECORDIAL 03/02/2009   Multiple sclerosis ([place] 02/20/2009   Essential hypertension 02/20/2009   GERD 02/20/2009   ARTHRITIS 02/20/2009   DEGENERATIVE [place] Point[place]EASE 02/20/2009   Other fatigue 02/20/2009   DYSPNEA ON EXERTION 02/20/2009   Past Surgical History:  Procedure Laterality Date   APPENDECTOMY     GALLBLADDER SURGERY     metal plate resection     skull   PACEMAKER IMPLANT N/A 04/19/2021   Procedure: PACEMAKER IMPLANT;  Surgeon: LVickie Epley MD;  [facility];  Service: Cardiovascular;  Laterality: N/A;   pilonidal cyst     Current Outpatient Medications on File Prior to Visit  Medication Sig Dispense Refill   aspirin 325 MG tablet Take 325 mg by mouth every 6 (six) hours as needed for mild pain or headache.     benazepril (LOTENSIN) 40 MG tablet  Take 1 tablet (40 mg total) by mouth daily. 90 tablet 0   Cholecalciferol (VITAMIN D3) 75 MCG (3000 UT) TABS Take 2,000 mg by mouth daily.     famotidine (PEPCID) 20 MG tablet Take 20 mg by mouth daily as needed for heartburn or indigestion.     hydrochlorothiazide (HYDRODIURIL) 25 MG tablet Take 1 tablet  by mouth daily. 90 tablet 1   hydrOXYzine (ATARAX) 25 MG tablet Take 1 tablet (25 mg total) by mouth every 6 (six) hours as needed for itching. 30 tablet 0   ibuprofen (ADVIL) 200 MG tablet Take 400 mg by mouth every 6 (six) hours as needed for mild pain.     meclizine (ANTIVERT) 25 MG tablet Take 25 mg by mouth 3 (three) times daily as needed for  dizziness.     metoprolol succinate (TOPROL-XL) 50 MG 24 hr tablet Take 1 tablet (50 mg total) by mouth daily. Take with or immediately following a meal. 30 tablet 2   ondansetron (ZOFRAN-ODT) 8 MG disintegrating tablet Dissolve 1 tablet (8 mg total) by mouth every 8 (eight) hours as needed for nausea. 60 tablet 12   rizatriptan (MAXALT) 10 MG tablet Take 1 tablet by mouth as needed for migraine. May repeat in 2 hours if needed 10 tablet 5   No current facility-administered medications on file prior to visit.    Allergies  Allergen Reactions   Niacin Anaphylaxis, Hives, Itching and Rash   Niacin And Related Anaphylaxis   Social History   Occupational History   Occupation: Equities trader   Occupation: retired Marine scientist  Tobacco Use   Smoking status: Never   Smokeless tobacco: Never  Vaping Use   Vaping Use: Never used  Substance and Sexual Activity   Alcohol use: No   Drug use: No   Sexual activity: Not Currently   Family History  Adopted: Yes  Problem Relation Age of Onset   Lupus Sister    Cancer Sister        bones, brain but not sure where it started   Heart attack Sister    Heart attack Sister    Breast cancer Neg Hx    Colon cancer Neg Hx    Esophageal cancer Neg Hx    Stomach cancer Neg Hx    Immunization History  Administered Date(s) Administered   PFIZER(Purple Top)SARS-COV-2 Vaccination 11/15/2019, 12/09/2019, 06/15/2020, 12/29/2020   Pneumococcal Polysaccharide-23 09/24/2019   Zoster Recombinat (Shingrix) 01/24/2020, 04/02/2020     Review of Systems: Negative except as noted in the HPI.   Objective: Vitals:   09/20/22 1516  BP: (!) 143/82   Katherine Yu is a pleasant 72 y.o. female in NAD. AAO X 3.  Vascular Examination: Capillary refill time immediate b/l.  Palpable DP pulse(s) b/l LE. Palpable PT pulse(s) b/l LE. Pedal hair sparse. No pain with calf compression b/l. Lower extremity skin temperature gradient within normal limits. No edema noted b/l  LE. No cyanosis or clubbing noted b/l LE.  Neurological Examination: Sensation grossly intact b/l with 10 gram monofilament. Vibratory sensation intact b/l. Protective sensation intact 5/5 intact bilaterally with 10g monofilament b/l. Vibratory sensation intact b/l.  Dermatological Examination: Pedal skin with normal turgor, texture and tone b/l.  No open wounds. No interdigital macerations.   Toenails 1-5 b/l thick, discolored, elongated with subungual debris and pain on dorsal palpation.   Pedal skin is warm and supple b/l LE. No open wounds b/l LE. No interdigital macerations noted b/l LE. Nondystrophic toenails 1-5  bilaterally. No hyperkeratotic nor porokeratotic lesions present on today's visit.  Musculoskeletal Examination: Muscle strength 5/5 to all lower extremity muscle groups bilaterally. Mild tenderness to lateral aspect of calcaneus. No erythema, no edema, no drainage, no fluctuance. No gross bony deformities bilaterally. Wearing Altra sneakers.  Radiographs: None  Last A1c:      Latest Ref Rng & Units 09/20/2022   10:53 AM 03/17/2022   11:33 AM 10/29/2021    8:45 AM  Hemoglobin A1C  Hemoglobin-A1c 4.8 - 5.6 % 5.8  6.0  5.8    Footwear Assessment: Does the patient wear appropriate shoes? Yes. Does the patient need inserts/orthotics? May need in the future.  ADA Risk Categorization: Low Risk :  Patient has all of the following: Intact protective sensation No prior foot ulcer  No severe deformity Pedal pulses present  Assessment: 1. Overgrown toenails   2. Type 2 diabetes mellitus with complication, without long-term current use of insulin (HCC)    Plan: -Patient was evaluated and treated. All patient's and/or POA's questions/concerns answered on today's visit. -Added felt lateral post to insole. -Diabetic foot examination performed today. -Continue supportive shoe gear daily. -Patient/POA to call should there be question/concern in the interim. Return in about  3 months (around 12/20/2022).  Marzetta Board, DPM

## 2022-09-20 NOTE — Telephone Encounter (Signed)
[employer] NPR sent to [place]

## 2022-09-21 ENCOUNTER — Encounter: Payer: Self-pay | Admitting: Podiatry

## 2022-09-21 ENCOUNTER — Encounter: Payer: Self-pay | Admitting: Family

## 2022-09-21 LAB — CBC WITH DIFFERENTIAL/PLATELET
Basophils Absolute: 0.1 10*3/uL (ref 0.0–0.2)
Basos: 1 %
EOS (ABSOLUTE): 0.1 10*3/uL (ref 0.0–0.4)
Eos: 3 %
Hematocrit: 43.4 % (ref 34.0–46.6)
Hemoglobin: 14.9 g/dL (ref 11.1–15.9)
Immature Grans (Abs): 0 10*3/uL (ref 0.0–0.1)
Immature Granulocytes: 0 %
Lymphocytes Absolute: 1.1 10*3/uL (ref 0.7–3.1)
Lymphs: 23 %
MCH: 30.6 pg (ref 26.6–33.0)
MCHC: 34.3 g/dL (ref 31.5–35.7)
MCV: 89 fL (ref 79–97)
Monocytes Absolute: 0.4 10*3/uL (ref 0.1–0.9)
Monocytes: 8 %
Neutrophils Absolute: 3.1 10*3/uL (ref 1.4–7.0)
Neutrophils: 65 %
Platelets: 227 10*3/uL (ref 150–450)
RBC: 4.87 x10E6/uL (ref 3.77–5.28)
RDW: 12.5 % (ref 11.7–15.4)
WBC: 4.7 10*3/uL (ref 3.4–10.8)

## 2022-09-21 LAB — COMPREHENSIVE METABOLIC PANEL
ALT: 17 IU/L (ref 0–32)
AST: 21 IU/L (ref 0–40)
Albumin/Globulin Ratio: 1.9 (ref 1.2–2.2)
Albumin: 4.8 g/dL (ref 3.9–4.9)
Alkaline Phosphatase: 70 IU/L (ref 44–121)
BUN/Creatinine Ratio: 28 (ref 12–28)
BUN: 29 mg/dL — ABNORMAL HIGH (ref 8–27)
Bilirubin Total: 0.4 mg/dL (ref 0.0–1.2)
CO2: 25 mmol/L (ref 20–29)
Calcium: 9.7 mg/dL (ref 8.7–10.3)
Chloride: 102 mmol/L (ref 96–106)
Creatinine, Ser: 1.03 mg/dL — ABNORMAL HIGH (ref 0.57–1.00)
Globulin, Total: 2.5 g/dL (ref 1.5–4.5)
Glucose: 122 mg/dL — ABNORMAL HIGH (ref 70–99)
Potassium: 4.2 mmol/L (ref 3.5–5.2)
Sodium: 142 mmol/L (ref 134–144)
Total Protein: 7.3 g/dL (ref 6.0–8.5)
eGFR: 59 mL/min/{1.73_m2} — ABNORMAL LOW (ref >59)

## 2022-09-21 LAB — HEMOGLOBIN A1C
Est. average glucose Bld gHb Est-mCnc: 120 mg/dL
Hgb A1c MFr Bld: 5.8 % — ABNORMAL HIGH (ref 4.8–5.6)

## 2022-09-21 LAB — TSH: TSH: 3.09 u[IU]/mL (ref 0.450–4.500)

## 2022-09-23 ENCOUNTER — Encounter: Payer: Self-pay | Admitting: Podiatry

## 2022-09-23 ENCOUNTER — Other Ambulatory Visit (HOSPITAL_COMMUNITY): Payer: Self-pay

## 2022-09-23 DIAGNOSIS — H401121 Primary open-angle glaucoma, left eye, mild stage: Secondary | ICD-10-CM | POA: Diagnosis not present

## 2022-09-23 MED ORDER — PREDNISOLONE ACETATE 1 % OP SUSP
1.0000 [drp] | Freq: Four times a day (QID) | OPHTHALMIC | 0 refills | Status: DC
  Filled 2022-09-23: qty 5, 25d supply, fill #0

## 2022-09-27 NOTE — Progress Notes (Unsigned)
Cardiology Office Note   Date:  09/29/2022   ID:  Daliah, Chaudoin 20-Feb-1951, MRN [id number]  PCP:  Eugenia Pancoast, FNP  Cardiologist:   Minus Breeding, MD   Chief Complaint  Patient presents with   Palpitations    History of Present Illness: Katherine Yu is a 72 y.o. female who is referred by Eugenia Pancoast, [place] for evaluation of chest pain.  She had a negative POET (Plain Old Exercise Treadmill).    She had runs of SVT and CHB .  She is now status post PPM.  At her follow up with Dr. Quentin Ore she had chest pain and was sent for a coronary CT .  She had no coronary disease.    Since she was last seen she she has done well.  The patient denies any new symptoms such as chest discomfort, neck or arm discomfort. There has been no new shortness of breath, PND or orthopnea. There have been no reported palpitations, presyncope or syncope.    Past Medical History:  Diagnosis Date   ARTHRITIS 02/20/2009   Qualifier: Diagnosis of  By: Hester Mates, Carol     CHEST PAIN-PRECORDIAL 03/02/2009   Qualifier: Diagnosis of  By: Verl Blalock, MD, Estevan Oaks C    Chronic insomnia    Degenerative arthritis    DEPRESSION 02/20/2009   Qualifier: Diagnosis of  By: Ronne Binning     DEPRESSION 02/20/2009   Qualifier: Diagnosis of  By: Orville Govern CMA, Carol     Dyslipidemia    FATIGUE 02/20/2009   Qualifier: Diagnosis of  By: Orville Govern CMA, Carol     GERD 02/20/2009   Qualifier: Diagnosis of  By: Orville Govern CMA, Carol     History of lower leg fracture    History of peptic ulcer    History of seizures    In childhood   HYPERLIPIDEMIA-MIXED 05/04/2010   Qualifier: Diagnosis of  By: Verl Blalock, MD, Estevan Oaks C    Hypertension    Migraine    Mild obesity    MULTIPLE SCLEROSIS 02/20/2009   Qualifier: Diagnosis of  By: Orville Govern, CMA, Carol     Plantar fasciitis    Temporomandibular joint disease    Urinary incontinence     Past Surgical History:  Procedure Laterality Date   APPENDECTOMY     GALLBLADDER  SURGERY     metal plate resection     skull   PACEMAKER IMPLANT N/A 04/19/2021   Procedure: PACEMAKER IMPLANT;  Surgeon: Vickie Epley, MD;  Location: [facility];  Service: Cardiovascular;  Laterality: N/A;   pilonidal cyst       Current Outpatient Medications  Medication Sig Dispense Refill   aspirin 325 MG tablet Take 325 mg by mouth every 6 (six) hours as needed for mild pain or headache.     benazepril (LOTENSIN) 40 MG tablet Take 1 tablet (40 mg total) by mouth daily. 90 tablet 0   Cholecalciferol (VITAMIN D3) 75 MCG (3000 UT) TABS Take 2,000 mg by mouth daily.     famotidine (PEPCID) 20 MG tablet Take 20 mg by mouth daily as needed for heartburn or indigestion.     hydrochlorothiazide (HYDRODIURIL) 25 MG tablet Take 1 tablet  by mouth daily. 90 tablet 1   hydrOXYzine (ATARAX) 25 MG tablet Take 1 tablet (25 mg total) by mouth every 6 (six) hours as needed for itching. 30 tablet 0   ibuprofen (ADVIL) 200 MG tablet Take 400 mg by  mouth every 6 (six) hours as needed for mild pain.     meclizine (ANTIVERT) 25 MG tablet Take 25 mg by mouth 3 (three) times daily as needed for dizziness.     metoprolol succinate (TOPROL-XL) 50 MG 24 hr tablet Take 1 tablet (50 mg total) by mouth daily. Take with or immediately following a meal. 30 tablet 2   ondansetron (ZOFRAN-ODT) 8 MG disintegrating tablet Dissolve 1 tablet (8 mg total) by mouth every 8 (eight) hours as needed for nausea. 60 tablet 12   rizatriptan (MAXALT) 10 MG tablet Take 1 tablet by mouth as needed for migraine. May repeat in 2 hours if needed 10 tablet 5   prednisoLONE acetate (PRED FORTE) 1 % ophthalmic suspension Place 1 drop into the left eye 4 (four) times daily. 5 mL 0   No current facility-administered medications for this visit.    Allergies:   Niacin and Niacin and related   ROS:  Please see the history of present illness.   Otherwise, review of systems are positive for none.   All other systems are reviewed and  negative.    PHYSICAL EXAM: VS:  BP 118/74 (BP Location: Left Arm, Patient Position: Sitting, Cuff Size: Normal)   Pulse 97   Ht '5\' 6"'$  (1.676 m)   Wt 177 lb (80.3 kg)   SpO2 99%   BMI 28.57 kg/m  , BMI Body mass index is 28.57 kg/m. GENERAL:  Well appearing NECK:  No jugular venous distention, waveform within normal limits, carotid upstroke brisk and symmetric, no bruits, no thyromegaly LUNGS:  Clear to auscultation bilaterally CHEST:    Well healed pacemaker scar HEART:  PMI not displaced or sustained,S1 and S2 within normal limits, no S3, no S4, no clicks, no rubs, no murmurs ABD:  Flat, positive bowel sounds normal in frequency in pitch, no bruits, no rebound, no guarding, no midline pulsatile mass, no hepatomegaly, no splenomegaly EXT:  2 plus pulses throughout, no edema, no cyanosis no clubbing   EKG:  EKG is  done today.  Sinus rhythm, rate 97 with ventricular pacing 100% capture.  Recent Labs: 09/20/2022: ALT 17; BUN 29; Creatinine, Ser 1.03; Hemoglobin 14.9; Platelets 227; Potassium 4.2; Sodium 142; TSH 3.090    Lipid Panel    Component Value Date/Time   CHOL 156 04/28/2022 0840   CHOL 146 08/10/2020 0903   TRIG 130.0 04/28/2022 0840   HDL 42.40 04/28/2022 0840   HDL 42 08/10/2020 0903   CHOLHDL 4 04/28/2022 0840   VLDL 26.0 04/28/2022 0840   LDLCALC 87 04/28/2022 0840   LDLCALC 88 08/10/2020 0903      Wt Readings from Last 3 Encounters:  09/29/22 177 lb (80.3 kg)  09/20/22 175 lb 8 oz (79.6 kg)  06/27/22 173 lb (78.5 kg)      Other studies Reviewed: Additional studies/ records that were reviewed today include:  labs Review of the above records demonstrates:  Please see elsewhere in the note.     ASSESSMENT AND PLAN:   CHB:    She is up-to-date with pacemaker follow-up.  No change in therapy.  HTN: Her blood pressure is at target.  No change in therapy.   PALPITATIONS: She has occasional palpitations but she is happy with how well they are  controlled.  VSD:   There was a questionable small VSD on CT but this would not be of clinical significance.  No change in therapy.   Current medicines are reviewed at length with the patient  today.  The patient does not have concerns regarding medicines.  The following changes have been made:  None  Labs/ tests ordered today include: None  Orders Placed This Encounter  Procedures   EKG 12-Lead    Disposition:   FU with in six months.    Signed, Minus Breeding, MD  09/29/2022 3:45 PM

## 2022-09-29 ENCOUNTER — Ambulatory Visit: Payer: PPO | Attending: Cardiology | Admitting: Cardiology

## 2022-09-29 ENCOUNTER — Encounter: Payer: Self-pay | Admitting: Cardiology

## 2022-09-29 VITALS — BP 118/74 | HR 97 | Ht 66.0 in | Wt 177.0 lb

## 2022-09-29 DIAGNOSIS — R002 Palpitations: Secondary | ICD-10-CM | POA: Diagnosis not present

## 2022-09-29 DIAGNOSIS — I442 Atrioventricular block, complete: Secondary | ICD-10-CM | POA: Diagnosis not present

## 2022-09-29 DIAGNOSIS — R072 Precordial pain: Secondary | ICD-10-CM

## 2022-09-29 NOTE — Patient Instructions (Addendum)
Medication Instructions:  Your physician recommends that you continue on your current medications as directed. Please refer to the Current Medication list given to you today.  *If you need a refill on your cardiac medications before your next appointment, please call your pharmacy*   Lab Work: NONE ordered at this time of appointment   If you have labs (blood work) drawn today and your tests are completely normal, you will receive your results only by: [place] (if you have MyChart) OR A paper copy in the mail If you have any lab test that is abnormal or we need to change your treatment, we will call you to review the results.   Testing/Procedures: NONE ordered at this time of appointment     Follow-Up: At [facility], you and your health needs are our priority.  As part of our continuing mission to provide you with exceptional heart care, we have created designated Provider Care Teams.  These Care Teams include your primary Cardiologist (physician) and Advanced Practice Providers (APPs -  Physician Assistants and Nurse Practitioners) who all work together to provide you with the care you need, when you need it.  We recommend signing up for the patient portal called "MyChart".  Sign up information is provided on this After Visit Summary.  MyChart is used to connect with patients for Virtual Visits (Telemedicine).  Patients are able to view lab/test results, encounter notes, upcoming appointments, etc.  Non-urgent messages can be sent to your provider as well.   To learn more about what you can do with MyChart, go to [email or web address].    Your next appointment:   1 year(s)  Provider:   Minus Breeding, MD     Other Instructions

## 2022-10-24 ENCOUNTER — Ambulatory Visit: Payer: PPO

## 2022-10-24 ENCOUNTER — Other Ambulatory Visit: Payer: Self-pay | Admitting: Neurology

## 2022-10-24 DIAGNOSIS — I442 Atrioventricular block, complete: Secondary | ICD-10-CM | POA: Diagnosis not present

## 2022-10-24 LAB — CUP PACEART REMOTE DEVICE CHECK
Battery Remaining Longevity: 112 mo
Battery Voltage: 3.01 V
Brady Statistic AP VP Percent: 0.18 %
Brady Statistic AP VS Percent: 0 %
Brady Statistic AS VP Percent: 99.8 %
Brady Statistic AS VS Percent: 0.02 %
Brady Statistic RA Percent Paced: 0.18 %
Brady Statistic RV Percent Paced: 99.98 %
Date Time Interrogation Session: 20240218211011
Implantable Lead Connection Status: 753985
Implantable Lead Connection Status: 753985
Implantable Lead Implant Date: 20220815
Implantable Lead Implant Date: 20220815
Implantable Lead Location: 753859
Implantable Lead Location: 753860
Implantable Lead Model: 3830
Implantable Lead Model: 5076
Implantable Pulse Generator Implant Date: 20220815
Lead Channel Impedance Value: 342 Ohm
Lead Channel Impedance Value: 380 Ohm
Lead Channel Impedance Value: 589 Ohm
Lead Channel Impedance Value: 608 Ohm
Lead Channel Pacing Threshold Amplitude: 1 V
Lead Channel Pacing Threshold Amplitude: 1.375 V
Lead Channel Pacing Threshold Pulse Width: 0.4 ms
Lead Channel Pacing Threshold Pulse Width: 0.4 ms
Lead Channel Sensing Intrinsic Amplitude: 2.875 mV
Lead Channel Sensing Intrinsic Amplitude: 2.875 mV
Lead Channel Sensing Intrinsic Amplitude: 5.5 mV
Lead Channel Sensing Intrinsic Amplitude: 5.5 mV
Lead Channel Setting Pacing Amplitude: 2 V
Lead Channel Setting Pacing Amplitude: 2.75 V
Lead Channel Setting Pacing Pulse Width: 0.4 ms
Lead Channel Setting Sensing Sensitivity: 0.9 mV
Zone Setting Status: 755011

## 2022-10-25 ENCOUNTER — Other Ambulatory Visit: Payer: Self-pay

## 2022-10-25 ENCOUNTER — Other Ambulatory Visit (HOSPITAL_COMMUNITY): Payer: Self-pay

## 2022-10-25 MED ORDER — HYDROXYZINE HCL 25 MG PO TABS
25.0000 mg | ORAL_TABLET | Freq: Four times a day (QID) | ORAL | 5 refills | Status: DC | PRN
  Filled 2022-10-25: qty 30, 8d supply, fill #0
  Filled 2023-01-31: qty 30, 8d supply, fill #1
  Filled 2023-04-19: qty 30, 8d supply, fill #2
  Filled 2023-07-13: qty 30, 8d supply, fill #3
  Filled 2023-09-26: qty 30, 8d supply, fill #4
  Filled 2023-10-16: qty 30, 8d supply, fill #5

## 2022-10-25 NOTE — Telephone Encounter (Signed)
Pt last seen on 09/20/22 I couldn't see anything in the note about pt continuing hydroxyzine 25 mg tablet. I just to confirm you are okay prescribing Rx.

## 2022-10-26 ENCOUNTER — Other Ambulatory Visit (HOSPITAL_COMMUNITY): Payer: Self-pay

## 2022-10-27 ENCOUNTER — Ambulatory Visit (HOSPITAL_COMMUNITY)
Admission: RE | Admit: 2022-10-27 | Discharge: 2022-10-27 | Disposition: A | Discharge status: Home / Self Care | Payer: PPO | Source: Ambulatory Visit | Attending: Neurology | Admitting: Neurology

## 2022-10-27 DIAGNOSIS — G35 Multiple sclerosis: Secondary | ICD-10-CM

## 2022-10-27 DIAGNOSIS — H401121 Primary open-angle glaucoma, left eye, mild stage: Secondary | ICD-10-CM | POA: Diagnosis not present

## 2022-10-27 DIAGNOSIS — H40011 Open angle with borderline findings, low risk, right eye: Secondary | ICD-10-CM | POA: Diagnosis not present

## 2022-10-27 MED ORDER — GADOBUTROL 1 MMOL/ML IV SOLN
8.0000 mL | Freq: Once | INTRAVENOUS | Status: AC | PRN
  Administered 2022-10-27: 8 mL via INTRAVENOUS

## 2022-10-27 NOTE — Progress Notes (Signed)
Per order, changed device settings for MRI to  DOO at 110 bpm  Will program device back to pre-MRI settings after completion of exam, and send transmission

## 2022-10-30 ENCOUNTER — Encounter: Payer: Self-pay | Admitting: Neurology

## 2022-10-31 ENCOUNTER — Ambulatory Visit: Payer: PPO | Admitting: Family

## 2022-11-08 ENCOUNTER — Ambulatory Visit (INDEPENDENT_AMBULATORY_CARE_PROVIDER_SITE_OTHER): Payer: PPO | Admitting: Family

## 2022-11-08 ENCOUNTER — Encounter: Payer: Self-pay | Admitting: Family

## 2022-11-08 VITALS — BP 130/76 | HR 95 | Temp 97.8°F | Ht 66.0 in | Wt 176.0 lb

## 2022-11-08 DIAGNOSIS — G43009 Migraine without aura, not intractable, without status migrainosus: Secondary | ICD-10-CM | POA: Diagnosis not present

## 2022-11-08 DIAGNOSIS — E782 Mixed hyperlipidemia: Secondary | ICD-10-CM | POA: Diagnosis not present

## 2022-11-08 DIAGNOSIS — E118 Type 2 diabetes mellitus with unspecified complications: Secondary | ICD-10-CM

## 2022-11-08 DIAGNOSIS — M791 Myalgia, unspecified site: Secondary | ICD-10-CM | POA: Diagnosis not present

## 2022-11-08 DIAGNOSIS — Z789 Other specified health status: Secondary | ICD-10-CM

## 2022-11-08 DIAGNOSIS — I10 Essential (primary) hypertension: Secondary | ICD-10-CM | POA: Diagnosis not present

## 2022-11-08 DIAGNOSIS — E119 Type 2 diabetes mellitus without complications: Secondary | ICD-10-CM

## 2022-11-08 DIAGNOSIS — G35 Multiple sclerosis: Secondary | ICD-10-CM

## 2022-11-08 DIAGNOSIS — T466X5A Adverse effect of antihyperlipidemic and antiarteriosclerotic drugs, initial encounter: Secondary | ICD-10-CM

## 2022-11-08 NOTE — Progress Notes (Signed)
Established Patient Office Visit  Subjective:  Patient ID: Katherine Yu, female    DOB: Sep 18, 1950  Age: 72 y.o. MRN: [id number]  CC:  Chief Complaint  Patient presents with   Establish Care    [facility] from [place] is here for a transition of care visit.  Prior provider was: Dr. Waunita Schooner   Pt is without acute concerns.   chronic concerns:  Migraines: rizatriptan as needed, have improved then back when she used to work. On average 4-5 times a month.   MS: sees Dr. Felecia Shelling regularly.   Prediabetic: improvement since prior at 6.0  Lab Results  Component Value Date   HGBA1C 5.8 (H) 09/20/2022   HTN: states generally well controlled. On HCTZ 25 mg once daily as well as lotensin 40 mg once daily. Sees cardiologist annually , sees electrophysiologist every six months or so.   Past Medical History:  Diagnosis Date   ARTHRITIS 02/20/2009   Qualifier: Diagnosis of  By: Hester Mates, Carol     CHEST PAIN-PRECORDIAL 03/02/2009   Qualifier: Diagnosis of  By: Verl Blalock, MD, Estevan Oaks C    Chronic insomnia    Degenerative arthritis    DEPRESSION 02/20/2009   Qualifier: Diagnosis of  By: Ronne Binning     DEPRESSION 02/20/2009   Qualifier: Diagnosis of  By: Orville Govern CMA, Carol     Dyslipidemia    FATIGUE 02/20/2009   Qualifier: Diagnosis of  By: Orville Govern CMA, Carol     GERD 02/20/2009   Qualifier: Diagnosis of  By: Orville Govern CMA, Carol     History of lower leg fracture    History of peptic ulcer    History of seizures    In childhood   HYPERLIPIDEMIA-MIXED 05/04/2010   Qualifier: Diagnosis of  By: Verl Blalock, MD, Estevan Oaks C    Hypertension    Migraine    Mild obesity    MULTIPLE SCLEROSIS 02/20/2009   Qualifier: Diagnosis of  By: Orville Govern, CMA, Carol     Plantar fasciitis    Temporomandibular joint disease    Urinary incontinence     Past Surgical History:  Procedure Laterality Date   APPENDECTOMY     GALLBLADDER SURGERY     metal plate resection     skull    PACEMAKER IMPLANT N/A 04/19/2021   Procedure: PACEMAKER IMPLANT;  Surgeon: Vickie Epley, MD;  Location: [facility];  Service: Cardiovascular;  Laterality: N/A;   pilonidal cyst      Family History  Adopted: Yes  Problem Relation Age of Onset   Lupus Sister    Cancer Sister        bones, brain but not sure where it started   Heart attack Sister    Heart attack Sister    Breast cancer Neg Hx    Colon cancer Neg Hx    Esophageal cancer Neg Hx    Stomach cancer Neg Hx     Social History   Socioeconomic History   Marital status: Married    Spouse name: Mikeal Hawthorne   Number of children: 3   Years of education: Masters   Highest education level: Not on file  Occupational History   Occupation: Equities trader   Occupation: retired Marine scientist  Tobacco Use   Smoking status: Never   Smokeless tobacco: Never  Vaping Use   Vaping Use: Never used  Substance and Sexual Activity   Alcohol use: No   Drug use:  No   Sexual activity: Yes    Partners: Male    Birth control/protection: Post-menopausal  Other Topics Concern   Not on file  Social History Narrative   Patient is right handed.   Patient drinks 1 cup caffeine daily.   Lives with husband and son.       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      Family: 3 children - Judson Roch, Marjory Lies (lives with her), [place]      Enjoys: reading and walking, meditate      Exercise: walking, and home exercise routine   Diet: diabetic diet      Safety   Seat belts: Yes    Guns: Yes  and in gun safe   Safe in relationships: Yes       Social Determinants of Health   Financial Resource Strain: [place]  (09/27/2021)   Overall Financial Resource Strain (CARDIA)    Difficulty of Paying Living Expenses: Not hard at all  Food Insecurity: Not on file  Transportation Needs: No Transportation Needs (09/27/2021)    PRAPARE - Hydrologist (Medical): No    Lack of Transportation (Non-Medical): No  Physical Activity: Not on file  Stress: Not on file  Social Connections: Not on file  Intimate Partner Violence: Unknown (09/27/2021)   Humiliation, Afraid, Rape, and Kick questionnaire    Fear of Current or Ex-Partner: No    Emotionally Abused: Not on file    Physically Abused: Not on file    Sexually Abused: Not on file    Outpatient Medications Prior to Visit  Medication Sig Dispense Refill   aspirin 325 MG tablet Take 325 mg by mouth every 6 (six) hours as needed for mild pain or headache.     benazepril (LOTENSIN) 40 MG tablet Take 1 tablet (40 mg total) by mouth daily. 90 tablet 0   Cholecalciferol (VITAMIN D3) 75 MCG (3000 UT) TABS Take 2,000 mg by mouth daily.     famotidine (PEPCID) 20 MG tablet Take 20 mg by mouth daily as needed for heartburn or indigestion.     hydrochlorothiazide (HYDRODIURIL) 25 MG tablet Take 1 tablet  by mouth daily. 90 tablet 1   hydrOXYzine (ATARAX) 25 MG tablet Take 1 tablet (25 mg total) by mouth every 6 (six) hours as needed for itching. 30 tablet 5   ibuprofen (ADVIL) 200 MG tablet Take 400 mg by mouth every 6 (six) hours as needed for mild pain.     meclizine (ANTIVERT) 25 MG tablet Take 25 mg by mouth 3 (three) times daily as needed for dizziness.     metoprolol succinate (TOPROL-XL) 50 MG 24 hr tablet Take 1 tablet (50 mg total) by mouth daily. Take with or immediately following a meal. 30 tablet 2   ondansetron (ZOFRAN-ODT) 8 MG disintegrating tablet Dissolve 1 tablet (8 mg total) by mouth every 8 (eight) hours as needed for nausea. 60 tablet 12   rizatriptan (MAXALT) 10 MG tablet Take 1 tablet by mouth as needed for migraine. May repeat in 2 hours if needed 10 tablet 5   prednisoLONE acetate (PRED FORTE) 1 % ophthalmic suspension Place 1 drop into the left eye 4 (four) times daily. 5 mL 0   No facility-administered medications prior to  visit.    Allergies  Allergen Reactions   Niacin Anaphylaxis, Hives, Itching and Rash   Crestor [Rosuvastatin] Other (See Comments)    Joint pain     ROS: Pertinent symptoms negative unless otherwise noted in HPI      Objective:    Physical Exam Vitals reviewed.  Constitutional:      Appearance: Normal appearance.  Eyes:     General:        Right eye: No discharge.        Left eye: No discharge.     Conjunctiva/sclera: Conjunctivae normal.  Cardiovascular:     Rate and Rhythm: Normal rate and regular rhythm.  Pulmonary:     Effort: Pulmonary effort is normal. No respiratory distress.  Musculoskeletal:        General: Normal range of motion.     Cervical back: Normal range of motion.     Right lower leg: No edema.     Left lower leg: No edema.  Neurological:     General: No focal deficit present.     Mental Status: She is alert and oriented to person, place, and time. Mental status is at baseline.  Psychiatric:        Mood and Affect: Mood normal.        Behavior: Behavior normal.        Thought Content: Thought content normal.        Judgment: Judgment normal.       BP 130/76   Pulse 95   Temp 97.8 F (36.6 C) (Temporal)   Ht '5\' 6"'$  (1.676 m)   Wt 176 lb (79.8 kg)   SpO2 98%   BMI 28.41 kg/m  Wt Readings from Last 3 Encounters:  11/08/22 176 lb (79.8 kg)  09/29/22 177 lb (80.3 kg)  09/20/22 175 lb 8 oz (79.6 kg)     Health Maintenance Due  Topic Date Due   Diabetic kidney evaluation - Urine ACR  Never done   DTaP/Tdap/Td (1 - Tdap) Never done   Pneumonia Vaccine 68+ Years old (2 of 2 - PCV) 09/23/2020    There are no preventive care reminders to display for this patient.  Lab Results  Component Value Date   TSH 3.090 09/20/2022   Lab Results  Component Value Date   WBC 4.7 09/20/2022   HGB 14.9 09/20/2022   HCT 43.4 09/20/2022   MCV 89 09/20/2022   PLT 227 09/20/2022   Lab Results  Component Value Date   NA 142 09/20/2022   K 4.2  09/20/2022   CO2 25 09/20/2022   GLUCOSE 122 (H) 09/20/2022   BUN 29 (H) 09/20/2022   CREATININE 1.03 (H) 09/20/2022   BILITOT 0.4 09/20/2022   ALKPHOS 70 09/20/2022   AST 21 09/20/2022   ALT 17 09/20/2022   PROT 7.3 09/20/2022   ALBUMIN 4.8 09/20/2022   CALCIUM 9.7 09/20/2022   ANIONGAP 9 04/19/2021   EGFR 59 (L) 09/20/2022   GFR 57.65 (L) 04/28/2022   Lab Results  Component Value Date   CHOL 156 04/28/2022   Lab Results  Component Value Date   HDL 42.40 04/28/2022   Lab Results  Component Value Date   LDLCALC 87 04/28/2022   Lab Results  Component Value Date   TRIG 130.0 04/28/2022   Lab Results  Component Value Date   CHOLHDL 4 04/28/2022   Lab Results  Component Value Date   HGBA1C 5.8 (H) 09/20/2022      Assessment & Plan:   Type 2 diabetes  mellitus with complication, without long-term current use of insulin (HCC) -     Microalbumin / creatinine urine ratio  Statin intolerance  Myalgia due to statin  Type 2 diabetes mellitus without complication, without long-term current use of insulin (HCC) Assessment & Plan: Urine microalbumin today.  Pt advised to:  Work on diabetic diet and exercise as tolerated. Yearly foot exam, and annual eye exam.      Mixed hyperlipidemia Assessment & Plan: Statin intolerant. Pt followed by cardiology.  Could consider in future repatha.    Migraine without aura and without status migrainosus, not intractable Assessment & Plan: Improving.  Rizatriptan prn    Essential hypertension Assessment & Plan: Controlled. Continue mctz 25 mg once daily and lotension 40 mg once daily. F/u with cardiology as scheduled.   Multiple sclerosis ([place]) Assessment & Plan: Continue f/u with neurologist as scheduled.     No orders of the defined types were placed in this encounter.   Follow-up: Return in about 1 year (around 11/08/2023) for f/u CPE.    Eugenia Pancoast, FNP

## 2022-11-08 NOTE — Assessment & Plan Note (Signed)
Controlled. Continue mctz 25 mg once daily and lotension 40 mg once daily. F/u with cardiology as scheduled.

## 2022-11-08 NOTE — Assessment & Plan Note (Signed)
Continue f/u with neurologist as scheduled.

## 2022-11-08 NOTE — Assessment & Plan Note (Signed)
Urine microalbumin today.  Pt advised to:  Work on diabetic diet and exercise as tolerated. Yearly foot exam, and annual eye exam.

## 2022-11-08 NOTE — Assessment & Plan Note (Signed)
Statin intolerant. Pt followed by cardiology.  Could consider in future repatha.

## 2022-11-08 NOTE — Assessment & Plan Note (Signed)
Improving.  Rizatriptan prn

## 2022-11-09 ENCOUNTER — Other Ambulatory Visit: Payer: Self-pay

## 2022-11-09 LAB — MICROALBUMIN / CREATININE URINE RATIO
Creatinine,U: 60.3 mg/dL
Microalb Creat Ratio: 1.2 mg/g (ref 0.0–30.0)
Microalb, Ur: <0.7 mg/dL (ref 0.0–1.9)

## 2022-11-11 ENCOUNTER — Other Ambulatory Visit: Payer: Self-pay | Admitting: Family Medicine

## 2022-11-11 ENCOUNTER — Other Ambulatory Visit (HOSPITAL_COMMUNITY): Payer: Self-pay

## 2022-11-15 ENCOUNTER — Other Ambulatory Visit (HOSPITAL_COMMUNITY): Payer: Self-pay

## 2022-11-15 ENCOUNTER — Encounter: Payer: Self-pay | Admitting: Family

## 2022-11-15 ENCOUNTER — Other Ambulatory Visit: Payer: Self-pay | Admitting: Family

## 2022-11-15 DIAGNOSIS — I10 Essential (primary) hypertension: Secondary | ICD-10-CM

## 2022-11-15 MED ORDER — HYDROCHLOROTHIAZIDE 25 MG PO TABS
25.0000 mg | ORAL_TABLET | Freq: Every day | ORAL | 1 refills | Status: DC
  Filled 2022-11-15: qty 90, 90d supply, fill #0

## 2022-11-16 ENCOUNTER — Other Ambulatory Visit (HOSPITAL_COMMUNITY): Payer: Self-pay

## 2022-11-16 ENCOUNTER — Encounter: Payer: Self-pay | Admitting: Family

## 2022-11-16 ENCOUNTER — Telehealth: Payer: Self-pay | Admitting: Family

## 2022-11-16 MED ORDER — HYDROCHLOROTHIAZIDE 25 MG PO TABS
25.0000 mg | ORAL_TABLET | Freq: Every day | ORAL | 1 refills | Status: DC
  Filled 2022-11-16: qty 90, 90d supply, fill #0
  Filled 2023-01-31: qty 90, 90d supply, fill #1

## 2022-11-16 NOTE — Telephone Encounter (Signed)
Patient came by and dropped off all of her medical records from her previous doctor office,[facility]. Records left in tabithas folder up front.

## 2022-11-16 NOTE — Telephone Encounter (Signed)
FYI, I have updated the patients Imm Record.

## 2022-11-17 NOTE — Telephone Encounter (Signed)
See 11/16/22 message. Placed in Tabitha's box.

## 2022-11-17 NOTE — Telephone Encounter (Signed)
Placed records in [place] box.

## 2022-11-19 ENCOUNTER — Other Ambulatory Visit: Payer: Self-pay | Admitting: Family

## 2022-11-21 ENCOUNTER — Other Ambulatory Visit: Payer: Self-pay | Admitting: Family

## 2022-11-21 ENCOUNTER — Other Ambulatory Visit: Payer: Self-pay

## 2022-11-21 ENCOUNTER — Other Ambulatory Visit (HOSPITAL_COMMUNITY): Payer: Self-pay

## 2022-11-21 MED ORDER — BENAZEPRIL HCL 40 MG PO TABS
40.0000 mg | ORAL_TABLET | Freq: Every day | ORAL | 0 refills | Status: DC
  Filled 2022-11-21: qty 90, 90d supply, fill #0

## 2022-12-05 NOTE — Progress Notes (Signed)
Remote pacemaker transmission.   

## 2022-12-08 ENCOUNTER — Telehealth: Payer: Self-pay | Admitting: Family

## 2022-12-08 NOTE — Telephone Encounter (Signed)
Called pt to discuss comments made regarding her 3/5 visit. Pt was upset because she didn't include her name and didn't understand how it was linked to her. I explained I am able to match a survey to pt record when the comments indicate a need for follow up. Pt indicated she didn't want to talk about it and the call was ended.

## 2022-12-20 ENCOUNTER — Other Ambulatory Visit (HOSPITAL_COMMUNITY): Payer: Self-pay

## 2022-12-20 ENCOUNTER — Other Ambulatory Visit: Payer: Self-pay | Admitting: Cardiology

## 2022-12-20 DIAGNOSIS — R072 Precordial pain: Secondary | ICD-10-CM

## 2022-12-20 DIAGNOSIS — E118 Type 2 diabetes mellitus with unspecified complications: Secondary | ICD-10-CM

## 2022-12-20 DIAGNOSIS — I442 Atrioventricular block, complete: Secondary | ICD-10-CM

## 2022-12-20 DIAGNOSIS — I10 Essential (primary) hypertension: Secondary | ICD-10-CM

## 2022-12-20 MED ORDER — METOPROLOL SUCCINATE ER 50 MG PO TB24
50.0000 mg | ORAL_TABLET | Freq: Every day | ORAL | 10 refills | Status: DC
  Filled 2022-12-20: qty 30, 30d supply, fill #0

## 2022-12-21 ENCOUNTER — Other Ambulatory Visit (HOSPITAL_COMMUNITY): Payer: Self-pay

## 2022-12-24 ENCOUNTER — Other Ambulatory Visit (HOSPITAL_COMMUNITY): Payer: Self-pay

## 2022-12-27 ENCOUNTER — Ambulatory Visit: Payer: PPO | Admitting: Podiatry

## 2023-01-02 ENCOUNTER — Telehealth: Payer: Self-pay | Admitting: Family

## 2023-01-02 NOTE — Telephone Encounter (Signed)
Contacted Corinth[facility] Kemler Broden to schedule their annual wellness visit. Appointment made for 02/08/2023.  [facility] Care Guide [facility] AWV TEAM Direct Dial: [phone]

## 2023-01-13 ENCOUNTER — Ambulatory Visit: Payer: PPO | Admitting: Podiatry

## 2023-01-13 DIAGNOSIS — E118 Type 2 diabetes mellitus with unspecified complications: Secondary | ICD-10-CM | POA: Diagnosis not present

## 2023-01-13 DIAGNOSIS — L602 Onychogryphosis: Secondary | ICD-10-CM | POA: Diagnosis not present

## 2023-01-13 NOTE — Progress Notes (Unsigned)
  Subjective:  Patient ID: Era Skeen, female    DOB: [birth year],  MRN: [id number]  Era Skeen presents to clinic today for {jgcomplaint:23593}  Chief Complaint  Patient presents with   Nail Problem    RFC, PCP:   Mort Sawyers, FNP,     New problem(s): None. {jgcomplaint:23593}  PCP is Mort Sawyers, FNP.  Allergies  Allergen Reactions   Niacin Anaphylaxis, Hives, Itching and Rash   Crestor [Rosuvastatin] Other (See Comments)    Joint pain     Review of Systems: Negative except as noted in the HPI.  Objective: No changes noted in today's physical examination. There were no vitals filed for this visit. MANAIA ACHTER is a pleasant 72 y.o. female {jgbodyhabitus:24098} AAO x 3.  Vascular Examination: Capillary refill time immediate b/l.  Palpable DP pulse(s) b/l LE. Palpable PT pulse(s) b/l LE. Pedal hair sparse. No pain with calf compression b/l. Lower extremity skin temperature gradient within normal limits. No edema noted b/l LE. No cyanosis or clubbing noted b/l LE.  Neurological Examination: Sensation grossly intact b/l with 10 gram monofilament. Vibratory sensation intact b/l. Protective sensation intact 5/5 intact bilaterally with 10g monofilament b/l. Vibratory sensation intact b/l.  Dermatological Examination: Pedal skin with normal turgor, texture and tone b/l.  No open wounds. No interdigital macerations.   Toenails 1-5 b/l thick, discolored, elongated with subungual debris and pain on dorsal palpation.   Pedal skin is warm and supple b/l LE. No open wounds b/l LE. No interdigital macerations noted b/l LE. Nondystrophic toenails 1-5 bilaterally. No hyperkeratotic nor porokeratotic lesions present on today's visit.  Musculoskeletal Examination: Muscle strength 5/5 to all lower extremity muscle groups bilaterally. Mild tenderness to lateral aspect of calcaneus. No erythema, no edema, no drainage, no fluctuance. No gross bony deformities bilaterally. Wearing  Altra sneakers.  Radiographs: None  Assessment/Plan: No diagnosis found.  No orders of the defined types were placed in this encounter.   None {Jgplan:23602::"-Patient/POA to call should there be question/concern in the interim."}   Return in about 3 months (around 04/15/2023).  Freddie Breech, DPM

## 2023-01-17 ENCOUNTER — Encounter: Payer: Self-pay | Admitting: Podiatry

## 2023-01-19 DIAGNOSIS — Z950 Presence of cardiac pacemaker: Secondary | ICD-10-CM | POA: Insufficient documentation

## 2023-01-19 NOTE — Progress Notes (Signed)
Cardiology Office Note Date:  01/23/2023  Patient ID:  Katherine, Yu 02-Apr-1951, MRN [id number] PCP:  Mort Sawyers, FNP  Cardiologist:  Rollene Rotunda, MD Electrophysiologist: Lanier Prude, MD   Chief Complaint: 9 month follow-up PPM, past due  History of Present Illness: Katherine Yu is a 72 y.o. female with PMH notable for CHB s/p PPM, palpitations, HTN, ; seen today for Lanier Prude, MD for routine electrophysiology followup.  Last saw Dr. Lalla Brothers 07/2021. Was having CP - ordered coronary CT that was negative for coronary disease. Bedside echo showed stable LV lead. She has followed up regularly with Dr. Antoine Poche. Has described episodes of heart racing while walking in the past.   Today, she continues to have periods of palpitations while at rest. She has previously been told that these are not concerning, and so she is not bothered by them. Longest episode is about an hour. Happens 1-2 times/week. No CP, diaphoresis with episodes. They are not worsening in severity or duration.   She has recently joined [employer], and really enjoys going.   Device Information: MDT dual chamber PPM, imp 04/2021; dx CHB  Past Medical History:  Diagnosis Date   ARTHRITIS 02/20/2009   Qualifier: Diagnosis of  By: Evalina Field, Carol     CHEST PAIN-PRECORDIAL 03/02/2009   Qualifier: Diagnosis of  By: Daleen Squibb, MD, Glade Lloyd C    Chronic insomnia    Degenerative arthritis    DEPRESSION 02/20/2009   Qualifier: Diagnosis of  By: Wendee Copp     DEPRESSION 02/20/2009   Qualifier: Diagnosis of  By: Denyse Amass CMA, Carol     Dyslipidemia    FATIGUE 02/20/2009   Qualifier: Diagnosis of  By: Denyse Amass CMA, Carol     GERD 02/20/2009   Qualifier: Diagnosis of  By: Denyse Amass CMA, Carol     History of lower leg fracture    History of peptic ulcer    History of seizures    In childhood   HYPERLIPIDEMIA-MIXED 05/04/2010   Qualifier: Diagnosis of  By: Daleen Squibb, MD, Glade Lloyd C    Hypertension    Migraine     Mild obesity    MULTIPLE SCLEROSIS 02/20/2009   Qualifier: Diagnosis of  By: Denyse Amass, CMA, Carol     Plantar fasciitis    Temporomandibular joint disease    Urinary incontinence     Past Surgical History:  Procedure Laterality Date   APPENDECTOMY     GALLBLADDER SURGERY     metal plate resection     skull   PACEMAKER IMPLANT N/A 04/19/2021   Procedure: PACEMAKER IMPLANT;  Surgeon: Lanier Prude, MD;  Location: [facility];  Service: Cardiovascular;  Laterality: N/A;   pilonidal cyst      Current Outpatient Medications  Medication Instructions   aspirin 325 mg, Oral, Every 6 hours PRN   benazepril (LOTENSIN) 40 MG tablet Take 1 tablet (40 mg) by mouth daily.   famotidine (PEPCID) 20 mg, Oral, Daily PRN   hydrochlorothiazide (HYDRODIURIL) 25 MG tablet Take 1 tablet  by mouth daily.   hydrOXYzine (ATARAX) 25 mg, Oral, Every 6 hours PRN   ibuprofen (ADVIL) 400 mg, Oral, Every 6 hours PRN   meclizine (ANTIVERT) 25 mg, Oral, 3 times daily PRN   metoprolol succinate (TOPROL-XL) 50 MG 24 hr tablet Take 1 tablet (50 mg total) by mouth daily. Take with or immediately following a meal. Must keep scheduled appointment with cardiologist.   ondansetron (ZOFRAN-ODT) 8  MG disintegrating tablet Dissolve 1 tablet (8 mg total) by mouth every 8 (eight) hours as needed for nausea.   rizatriptan (MAXALT) 10 MG tablet Take 1 tablet by mouth as needed for migraine. May repeat in 2 hours if needed   Vitamin D3 2,000 mg, Oral, Daily    Social History:  The patient  reports that she has never smoked. She has never used smokeless tobacco. She reports that she does not drink alcohol and does not use drugs.   Family History:  The patient's family history includes Cancer in her sister; Heart attack in her sister and sister; Lupus in her sister. She was adopted.  ROS:  Please see the history of present illness. All other systems are reviewed and otherwise negative.   PHYSICAL EXAM:  VS:  BP (!)  136/90 (BP Location: Left Arm, Patient Position: Sitting, Cuff Size: Normal)   Pulse 97   Ht 5\' 6"  (1.676 m)   Wt 177 lb (80.3 kg)   SpO2 98%   BMI 28.57 kg/m  BMI: Body mass index is 28.57 kg/m.  GEN- The patient is well appearing, alert and oriented x 3 today.   Lungs- Clear to ausculation bilaterally, normal work of breathing.  Heart- Regular rate and rhythm, no murmurs, rubs or gallops Extremities- No peripheral edema, warm, dry Skin-   device pocket well-healed, no tethering   Device interrogation done today and reviewed by myself:  Battery 10+ years Lead thresholds, impedence, sensing stable  No new episodes, older Atach episodes No changes made today Dependent  EKG is not ordered. Personal review of EKG from  09/29/2022  shows:  AS, VP at 97  Recent Labs: 09/20/2022: ALT 17; BUN 29; Creatinine, Ser 1.03; Hemoglobin 14.9; Platelets 227; Potassium 4.2; Sodium 142; TSH 3.090  04/28/2022: Cholesterol 156; HDL 42.40; LDL Cholesterol 87; Total CHOL/HDL Ratio 4; Triglycerides 130.0; VLDL 26.0   CrCl cannot be calculated (Patient's most recent lab result is older than the maximum 21 days allowed.).   Wt Readings from Last 3 Encounters:  01/23/23 177 lb (80.3 kg)  11/08/22 176 lb (79.8 kg)  09/29/22 177 lb (80.3 kg)     Additional studies reviewed include: Previous EP, cardiology notes.   Coronary CT, 08/11/2021 1. No evidence of CAD, CADRADS = 0.  2. Coronary calcium score of 0. This was 0 percentile for age and sex matched control.  3. Normal coronary origin with right dominance. 4.  Possible small muscular VSD. 5.  Pacemaker leads seen in right atrium and right ventricle  ASSESSMENT AND PLAN:  #) CHB s/p PPM Device functioning well, see paceart for details Histograms slightly elevated for activity level Dependent - increase toprol to 100mg  daily  #) HTN Slightly elevated in office, cont to monitor   Current medicines are reviewed at length with the patient  today.   The patient does not have concerns regarding her medicines.  The following changes were made today:   INCREASE toprol to 100mg  daily  Labs/ tests ordered today include:  No orders of the defined types were placed in this encounter.    Disposition: Follow up with Dr. Lalla Brothers or EP APP in 12 months   Signed, Sherie Don, NP  01/23/23  12:39 PM  Electrophysiology [facility]

## 2023-01-23 ENCOUNTER — Other Ambulatory Visit (HOSPITAL_COMMUNITY): Payer: Self-pay

## 2023-01-23 ENCOUNTER — Ambulatory Visit (INDEPENDENT_AMBULATORY_CARE_PROVIDER_SITE_OTHER): Payer: PPO

## 2023-01-23 ENCOUNTER — Ambulatory Visit: Payer: PPO | Attending: Cardiology | Admitting: Cardiology

## 2023-01-23 ENCOUNTER — Encounter: Payer: Self-pay | Admitting: Cardiology

## 2023-01-23 VITALS — BP 136/90 | HR 97 | Ht 66.0 in | Wt 177.0 lb

## 2023-01-23 DIAGNOSIS — Z950 Presence of cardiac pacemaker: Secondary | ICD-10-CM

## 2023-01-23 DIAGNOSIS — I10 Essential (primary) hypertension: Secondary | ICD-10-CM

## 2023-01-23 DIAGNOSIS — I442 Atrioventricular block, complete: Secondary | ICD-10-CM

## 2023-01-23 LAB — CUP PACEART INCLINIC DEVICE CHECK
Date Time Interrogation Session: 20240520125801
Implantable Lead Connection Status: 753985
Implantable Lead Connection Status: 753985
Implantable Lead Implant Date: 20220815
Implantable Lead Implant Date: 20220815
Implantable Lead Location: 753859
Implantable Lead Location: 753860
Implantable Lead Model: 3830
Implantable Lead Model: 5076
Implantable Pulse Generator Implant Date: 20220815

## 2023-01-23 LAB — CUP PACEART REMOTE DEVICE CHECK
Battery Remaining Longevity: 126 mo
Battery Voltage: 3.01 V
Brady Statistic AP VP Percent: 0.02 %
Brady Statistic AP VS Percent: 0 %
Brady Statistic AS VP Percent: 99.96 %
Brady Statistic AS VS Percent: 0.03 %
Brady Statistic RA Percent Paced: 0.02 %
Brady Statistic RV Percent Paced: 99.97 %
Date Time Interrogation Session: 20240519210722
Implantable Lead Connection Status: 753985
Implantable Lead Connection Status: 753985
Implantable Lead Implant Date: 20220815
Implantable Lead Implant Date: 20220815
Implantable Lead Location: 753859
Implantable Lead Location: 753860
Implantable Lead Model: 3830
Implantable Lead Model: 5076
Implantable Pulse Generator Implant Date: 20220815
Lead Channel Impedance Value: 342 Ohm
Lead Channel Impedance Value: 380 Ohm
Lead Channel Impedance Value: 608 Ohm
Lead Channel Impedance Value: 665 Ohm
Lead Channel Pacing Threshold Amplitude: 1 V
Lead Channel Pacing Threshold Amplitude: 1.125 V
Lead Channel Pacing Threshold Pulse Width: 0.4 ms
Lead Channel Pacing Threshold Pulse Width: 0.4 ms
Lead Channel Sensing Intrinsic Amplitude: 3.5 mV
Lead Channel Sensing Intrinsic Amplitude: 3.5 mV
Lead Channel Sensing Intrinsic Amplitude: 5.5 mV
Lead Channel Sensing Intrinsic Amplitude: 5.5 mV
Lead Channel Setting Pacing Amplitude: 2 V
Lead Channel Setting Pacing Amplitude: 2.25 V
Lead Channel Setting Pacing Pulse Width: 0.4 ms
Lead Channel Setting Sensing Sensitivity: 0.9 mV
Zone Setting Status: 755011

## 2023-01-23 MED ORDER — METOPROLOL SUCCINATE ER 100 MG PO TB24
100.0000 mg | ORAL_TABLET | Freq: Every day | ORAL | 3 refills | Status: DC
  Filled 2023-01-23: qty 90, 90d supply, fill #0
  Filled 2023-04-19: qty 90, 90d supply, fill #1
  Filled 2023-07-13: qty 90, 90d supply, fill #2
  Filled 2023-10-16: qty 90, 90d supply, fill #3

## 2023-01-23 NOTE — Patient Instructions (Addendum)
Medication Instructions:  INCREASE metoprolol to 100mg  daily - take two 50mg  tablets until you run out of current bottle  Your physician recommends that you continue on your current medications as directed. Please refer to the Current Medication list given to you today.  *If you need a refill on your cardiac medications before your next appointment, please call your pharmacy*   Lab Work: No labs ordered  If you have labs (blood work) drawn today and your tests are completely normal, you will receive your results only by: MyChart Message (if you have MyChart) OR A paper copy in the mail If you have any lab test that is abnormal or we need to change your treatment, we will call you to review the results.   Testing/Procedures: No testing ordered  Follow-Up: At [facility], you and your health needs are our priority.  As part of our continuing mission to provide you with exceptional heart care, we have created designated Provider Care Teams.  These Care Teams include your primary Cardiologist (physician) and Advanced Practice Providers (APPs -  Physician Assistants and Nurse Practitioners) who all work together to provide you with the care you need, when you need it.  We recommend signing up for the patient portal called "MyChart".  Sign up information is provided on this After Visit Summary.  MyChart is used to connect with patients for Virtual Visits (Telemedicine).  Patients are able to view lab/test results, encounter notes, upcoming appointments, etc.  Non-urgent messages can be sent to your provider as well.   To learn more about what you can do with MyChart, go to [email or web address].    Your next appointment:   1 year(s)  Provider:   Steffanie Dunn, MD or Sherie Don, NP

## 2023-01-31 ENCOUNTER — Other Ambulatory Visit: Payer: Self-pay | Admitting: Family

## 2023-01-31 ENCOUNTER — Other Ambulatory Visit: Payer: Self-pay

## 2023-01-31 ENCOUNTER — Other Ambulatory Visit (HOSPITAL_COMMUNITY): Payer: Self-pay

## 2023-01-31 MED ORDER — BENAZEPRIL HCL 40 MG PO TABS
40.0000 mg | ORAL_TABLET | Freq: Every day | ORAL | 3 refills | Status: DC
  Filled 2023-01-31: qty 90, 90d supply, fill #0
  Filled 2023-04-19: qty 90, 90d supply, fill #1
  Filled 2023-08-14: qty 90, 90d supply, fill #2
  Filled 2023-11-09: qty 90, 90d supply, fill #3

## 2023-02-08 ENCOUNTER — Ambulatory Visit: Payer: PPO

## 2023-02-17 NOTE — Progress Notes (Signed)
Remote pacemaker transmission.   

## 2023-02-27 NOTE — Progress Notes (Unsigned)
  Cardiology Office Note:   Date:  03/01/2023  ID:  Katherine, Yu [birth year], MRN [id number] PCP: Mort Sawyers, FNP  [facility] Providers Cardiologist:  Rollene Rotunda, MD Electrophysiologist:  Lanier Prude, MD {  History of Present Illness:   Katherine Yu is a 72 y.o. female  who is referred by Mort Sawyers, FNP for evaluation of chest pain.  She had a negative POET (Plain Old Exercise Treadmill).    She had runs of SVT and CHB .  She is now status post PPM.  At her follow up with Dr. Lalla Brothers she had chest pain and was sent for a coronary CT .  She had no coronary disease.   She was seen by EP in the still having some palpitations and so had her metoprolol increased.  She thinks this has helped.  The patient denies any new symptoms such as chest discomfort, neck or arm discomfort. There has been no new shortness of breath, PND or orthopnea. There have been no reported palpitations, presyncope or syncope.   ROS: As stated in the HPI and negative for all other systems.  Studies Reviewed:    EKG:   EKG Interpretation  Date/Time:  Wednesday March 01 2023 13:28:43 EDT Ventricular Rate:  89 PR Interval:  204 QRS Duration: 168 QT Interval:  436 QTC Calculation: 530 R Axis:   -79 Text Interpretation: Atrial-sensed ventricular-paced rhythm When compared with ECG of 20-Apr-2021 06:46, Vent. rate has increased BY  18 BPM Confirmed by Rollene Rotunda ([id number]) on 03/01/2023 1:49:50 PM   Risk Assessment/Calculations:              Physical Exam:   VS:  BP 122/80 (BP Location: Left Arm, Patient Position: Sitting, Cuff Size: Normal)   Pulse 82   Ht 5' 6.5" (1.689 m)   Wt 179 lb 3.2 oz (81.3 kg)   SpO2 97%   BMI 28.49 kg/m    Wt Readings from Last 3 Encounters:  03/01/23 179 lb 3.2 oz (81.3 kg)  01/23/23 177 lb (80.3 kg)  11/08/22 176 lb (79.8 kg)     GEN: Well nourished, well developed in no acute distress NECK: No JVD; No carotid bruits CARDIAC: RRR, no murmurs,  rubs, gallops RESPIRATORY:  Clear to auscultation without rales, wheezing or rhonchi  ABDOMEN: Soft, non-tender, non-distended EXTREMITIES:  No edema; No deformity   ASSESSMENT AND PLAN:   CHB:   She will follow-up in the EP clinic.  No change in therapy.   HTN: Her blood pressure is controlled.  No change in therapy.    PALPITATIONS: These are improved with increased dose of beta-blockers.  No change in therapy.    VSD:   No further imaging is indicated.  This was small and not clinically significant.       Follow up me as needed.  She will follow-up in the EP clinic.  Signed, Rollene Rotunda, MD

## 2023-03-01 ENCOUNTER — Ambulatory Visit: Payer: PPO | Attending: Cardiology | Admitting: Cardiology

## 2023-03-01 ENCOUNTER — Encounter: Payer: Self-pay | Admitting: Cardiology

## 2023-03-01 VITALS — BP 122/80 | HR 82 | Ht 66.5 in | Wt 179.2 lb

## 2023-03-01 DIAGNOSIS — I10 Essential (primary) hypertension: Secondary | ICD-10-CM | POA: Diagnosis not present

## 2023-03-01 DIAGNOSIS — I442 Atrioventricular block, complete: Secondary | ICD-10-CM | POA: Diagnosis not present

## 2023-03-01 DIAGNOSIS — R002 Palpitations: Secondary | ICD-10-CM

## 2023-03-01 NOTE — Patient Instructions (Signed)
Medication Instructions:  Your physician recommends that you continue on your current medications as directed. Please refer to the Current Medication list given to you today.  *If you need a refill on your cardiac medications before your next appointment, please call your pharmacy*   Follow-Up: At [facility], you and your health needs are our priority.  As part of our continuing mission to provide you with exceptional heart care, we have created designated Provider Care Teams.  These Care Teams include your primary Cardiologist (physician) and Advanced Practice Providers (APPs -  Physician Assistants and Nurse Practitioners) who all work together to provide you with the care you need, when you need it.  We recommend signing up for the patient portal called "MyChart".  Sign up information is provided on this After Visit Summary.  MyChart is used to connect with patients for Virtual Visits (Telemedicine).  Patients are able to view lab/test results, encounter notes, upcoming appointments, etc.  Non-urgent messages can be sent to your provider as well.   To learn more about what you can do with MyChart, go to [email or web address].    Your next appointment:   We will see you on an as needed basis.   Provider:   James Hochrein, MD    

## 2023-03-06 ENCOUNTER — Ambulatory Visit: Payer: PPO

## 2023-04-11 ENCOUNTER — Ambulatory Visit (INDEPENDENT_AMBULATORY_CARE_PROVIDER_SITE_OTHER): Payer: PPO

## 2023-04-11 VITALS — BP 140/76 | HR 96 | Ht 66.0 in | Wt 174.4 lb

## 2023-04-11 DIAGNOSIS — Z1231 Encounter for screening mammogram for malignant neoplasm of breast: Secondary | ICD-10-CM | POA: Diagnosis not present

## 2023-04-11 DIAGNOSIS — Z0000 Encounter for general adult medical examination without abnormal findings: Secondary | ICD-10-CM | POA: Diagnosis not present

## 2023-04-11 NOTE — Progress Notes (Signed)
Subjective:   Katherine Yu is a 72 y.o. female who presents for an Initial Medicare Annual Wellness Visit.  Visit Complete: In person  Patient Medicare AWV questionnaire was completed by the patient on 04/10/23; I have confirmed that all information answered by patient is correct and no changes since this date.  Review of Systems      Cardiac Risk Factors include: advanced age (>37men, >16 women);hypertension;diabetes mellitus;dyslipidemia     Objective:    Today's Vitals   04/11/23 1352  BP: (!) 140/76  Pulse: 96  SpO2: 98%  Weight: 174 lb 6.4 oz (79.1 kg)  Height: 5\' 6"  (1.676 m)   Body mass index is 28.15 kg/m.     04/11/2023    2:05 PM 04/28/2022    8:09 AM 04/19/2021   10:34 AM 05/07/2020    6:20 PM 10/15/2014    7:59 AM  Advanced Directives  Does Patient Have a Medical Advance Directive? No No No No No  Would patient like information on creating a medical advance directive? No - Patient declined Yes (MAU/Ambulatory/Procedural Areas - Information given) No - Patient declined No - Patient declined     Current Medications (verified) Outpatient Encounter Medications as of 04/11/2023  Medication Sig   aspirin 325 MG tablet Take 325 mg by mouth every 6 (six) hours as needed for mild pain or headache.   benazepril (LOTENSIN) 40 MG tablet Take 1 tablet (40 mg) by mouth daily.   bismuth subsalicylate (PEPTO BISMOL) 262 MG/15ML suspension Take 30 mLs by mouth every 4 (four) hours as needed for indigestion.   Cholecalciferol (VITAMIN D3) 75 MCG (3000 UT) TABS Take 2,000 mg by mouth daily.   Cranberry-Vitamin C-Probiotic (AZO CRANBERRY) 250-30 MG TABS Take by mouth.   famotidine (PEPCID) 20 MG tablet Take 20 mg by mouth daily as needed for heartburn or indigestion.   hydrochlorothiazide (HYDRODIURIL) 25 MG tablet Take 1 tablet  by mouth daily.   hydrOXYzine (ATARAX) 25 MG tablet Take 1 tablet (25 mg total) by mouth every 6 (six) hours as needed for itching.   ibuprofen (ADVIL)  200 MG tablet Take 400 mg by mouth every 6 (six) hours as needed for mild pain.   meclizine (ANTIVERT) 25 MG tablet Take 25 mg by mouth 3 (three) times daily as needed for dizziness.   Melatonin 10 MG TABS Take 10 mg by mouth at bedtime.   metoprolol succinate (TOPROL-XL) 100 MG 24 hr tablet Take 1 tablet (100 mg total) by mouth daily. Take with or immediately following a meal.   ondansetron (ZOFRAN-ODT) 8 MG disintegrating tablet Dissolve 1 tablet (8 mg total) by mouth every 8 (eight) hours as needed for nausea.   rizatriptan (MAXALT) 10 MG tablet Take 1 tablet by mouth as needed for migraine. May repeat in 2 hours if needed   No facility-administered encounter medications on file as of 04/11/2023.    Allergies (verified) Niacin and Crestor [rosuvastatin]   History: Past Medical History:  Diagnosis Date   ARTHRITIS 02/20/2009   Qualifier: Diagnosis of  By: Evalina Field, Carol     CHEST PAIN-PRECORDIAL 03/02/2009   Qualifier: Diagnosis of  By: Daleen Squibb, MD, Glade Lloyd C    Chronic insomnia    Degenerative arthritis    DEPRESSION 02/20/2009   Qualifier: Diagnosis of  By: Wendee Copp     DEPRESSION 02/20/2009   Qualifier: Diagnosis of  By: Denyse Amass CMA, Carol     Dyslipidemia    FATIGUE 02/20/2009  Qualifier: Diagnosis of  By: Evalina Field, Carol     GERD 02/20/2009   Qualifier: Diagnosis of  By: Denyse Amass CMA, Carol     History of lower leg fracture    History of peptic ulcer    History of seizures    In childhood   HYPERLIPIDEMIA-MIXED 05/04/2010   Qualifier: Diagnosis of  By: Daleen Squibb, MD, Glade Lloyd C    Hypertension    Migraine    Mild obesity    MULTIPLE SCLEROSIS 02/20/2009   Qualifier: Diagnosis of  By: Denyse Amass CMA, Carol     Plantar fasciitis    Temporomandibular joint disease    Urinary incontinence    Past Surgical History:  Procedure Laterality Date   APPENDECTOMY     GALLBLADDER SURGERY     metal plate resection     skull   PACEMAKER IMPLANT N/A 04/19/2021   Procedure:  PACEMAKER IMPLANT;  Surgeon: Lanier Prude, MD;  Location: [facility];  Service: Cardiovascular;  Laterality: N/A;   pilonidal cyst     Family History  Adopted: Yes  Problem Relation Age of Onset   Lupus Sister    Cancer Sister        bones, brain but not sure where it started   Heart attack Sister    Heart attack Sister    Breast cancer Neg Hx    Colon cancer Neg Hx    Esophageal cancer Neg Hx    Stomach cancer Neg Hx    Social History   Socioeconomic History   Marital status: Married    Spouse name: Remi Deter   Number of children: 3   Years of education: Masters   Highest education level: Not on file  Occupational History   Occupation: Designer, jewellery   Occupation: retired Engineer, civil (consulting)  Tobacco Use   Smoking status: Never   Smokeless tobacco: Never  Vaping Use   Vaping status: Never Used  Substance and Sexual Activity   Alcohol use: No   Drug use: No   Sexual activity: Yes    Partners: Male    Birth control/protection: Post-menopausal  Other Topics Concern   Not on file  Social History Narrative   Patient is right handed.   Patient drinks 1 cup caffeine daily.   Lives with husband and son.       10/29/21   From: grew up in [place] but came in 1972    Living: with husband, Remi Deter (1975) and son   Work: former Engineer, civil (consulting), retired due to [employer] impacting memory      Family: 3 children - Maralyn Sago, Clifton Custard (lives with her), [facility] ([place])      Enjoys: reading and walking, meditate      Exercise: walking, and home exercise routine   Diet: diabetic diet      Safety   Seat belts: Yes    Guns: Yes  and in gun safe   Safe in relationships: Yes       Social Determinants of Health   Financial Resource Strain: Low Risk  (04/11/2023)   Overall Financial Resource Strain (CARDIA)    Difficulty of Paying Living Expenses: Not hard at all  Food Insecurity: No Food Insecurity (04/11/2023)   Hunger Vital Sign    Worried About Running Out of Food in the Last Year: Never true     Ran Out of Food in the Last Year: Never true  Transportation Needs: No Transportation Needs (04/11/2023)   PRAPARE - Transportation    Lack  of Transportation (Medical): No    Lack of Transportation (Non-Medical): No  Physical Activity: Sufficiently Active (04/11/2023)   Exercise Vital Sign    Days of Exercise per Week: 7 days    Minutes of Exercise per Session: 60 min  Stress: No Stress Concern Present (04/11/2023)   [employer] of Occupational Health - Occupational Stress Questionnaire    Feeling of Stress : Not at all  Social Connections: Moderately Integrated (04/11/2023)   Social Connection and Isolation Panel [NHANES]    Frequency of Communication with Friends and Family: More than three times a week    Frequency of Social Gatherings with Friends and Family: More than three times a week    Attends Religious Services: More than 4 times per year    Active Member of [employer] or Organizations: No    Attends Engineer, structural: Never    Marital Status: Married    Tobacco Counseling Counseling given: Not Answered   Clinical Intake:  Pre-visit preparation completed: Yes  Pain : No/denies pain     BMI - recorded: 28.15 Nutritional Status: BMI 25 -29 Overweight Nutritional Risks: None Diabetes: Yes (pt states she believes she is possible pre-diabetic) CBG done?: No Did pt. bring in CBG monitor from home?: No  How often do you need to have someone help you when you read instructions, pamphlets, or other written materials from your doctor or pharmacy?: 1 - Never  Interpreter Needed?: No  Information entered by :: C. LPN   Activities of Daily Living    04/10/2023    2:49 PM 02/04/2023   10:58 AM  In your present state of health, do you have any difficulty performing the following activities:  Hearing? 0 0  Vision? 0 0  Difficulty concentrating or making decisions? 1 1  Comment occasionally forgets   Walking or climbing stairs? 0 0  Dressing or bathing? 0  0  Doing errands, shopping? 0 0  Preparing Food and eating ? N N  Using the Toilet? N N  In the past six months, have you accidently leaked urine? Malvin Johns  Comment Under urologist care at [facility]   Do you have problems with loss of bowel control? Y Y  Comment Due to MS   Managing your Medications? N N  Managing your Finances? Y Y  Comment Family assists   Housekeeping or managing your Housekeeping? Malvin Johns    Patient Care Team: Mort Sawyers, FNP as PCP - General (Family Medicine) Rollene Rotunda, MD as PCP - Cardiology (Cardiology) Lanier Prude, MD as PCP - Electrophysiology (Cardiology) Jerrell Mylar, MD as Referring Physician (Urology) Gerald Leitz, MD as Consulting Physician (Obstetrics and Gynecology) Epimenio Foot, Pearletha Furl, MD (Neurology) Mateo Flow, MD as Consulting Physician (Ophthalmology)  Indicate any recent Medical Services you may have received from other than [facility] providers in the past year (date may be approximate).     Assessment:   This is a routine wellness examination for Dezera.  Hearing/Vision screen Hearing Screening - Comments:: Denies hearing difficulties   Vision Screening - Comments:: Glasses - [facility] - UTD on eye exams  Dietary issues and exercise activities discussed:     Goals Addressed             This Visit's Progress    Patient Stated       Keep blood sugar under control, and decrease falls, and lose weight.       Depression Screen    04/11/2023  2:04 PM 11/08/2022   11:45 AM 04/28/2022    8:37 AM 04/28/2022    8:10 AM 10/29/2021    8:48 AM 05/07/2020    6:21 PM  PHQ 2/9 Scores  PHQ - 2 Score 0 0 0 0 0 0  PHQ- 9 Score  0   9     Fall Risk    04/11/2023    2:06 PM 04/10/2023    2:49 PM 02/04/2023   10:58 AM 11/08/2022   11:46 AM 10/29/2021    8:08 AM  Fall Risk   Falls in the past year? 1 1 1 1 1   Number falls in past yr: 1 1 1 1 1   Injury with Fall? 0 1 1 1  0  Comment Brusing      Risk for fall due to :  Impaired balance/gait   History of fall(s) History of fall(s);Impaired balance/gait  Follow up Falls evaluation completed;Falls prevention discussed;Education provided   Falls evaluation completed     MEDICARE RISK AT HOME:   TIMED UP AND GO:  Was the test performed? Yes  Length of time to ambulate 10 feet: 10 sec Gait steady and fast without use of assistive device    Cognitive Function:        04/11/2023    2:09 PM  6CIT Screen  What Year? 0 points  What month? 0 points  What time? 0 points  Count back from 20 4 points  Months in reverse 0 points  Repeat phrase 4 points  Total Score 8 points    Immunizations Immunization History  Administered Date(s) Administered   Influenza-Unspecified 07/26/2022   PFIZER(Purple Top)SARS-COV-2 Vaccination 11/15/2019, 12/09/2019, 06/15/2020, 12/29/2020, 06/21/2021   Pfizer Covid-19 Vaccine Bivalent Booster 2yrs & up 07/26/2022   Pneumococcal Polysaccharide-23 09/24/2019   Td 08/05/2021   Varicella 01/24/2020, 04/02/2020   Zoster Recombinant(Shingrix) 01/24/2020, 04/02/2020    TDAP status: Up to date  Flu Vaccine status: Up to date  Pneumococcal vaccine status: Due, Education has been provided regarding the importance of this vaccine. Advised may receive this vaccine at local pharmacy or Health Dept. Aware to provide a copy of the vaccination record if obtained from local pharmacy or Health Dept. Verbalized acceptance and understanding.  Covid-19 vaccine status: Information provided on how to obtain vaccines.   Qualifies for Shingles Vaccine? Yes   Zostavax completed No   Shingrix Completed?: Yes  Screening Tests Health Maintenance  Topic Date Due   Pneumonia Vaccine 20+ Years old (2 of 2 - PCV) 09/23/2020   COVID-19 Vaccine (7 - 2023-24 season) 09/20/2022   HEMOGLOBIN A1C  03/21/2023   INFLUENZA VACCINE  04/06/2023   COLON CANCER SCREENING ANNUAL FOBT  06/28/2023   OPHTHALMOLOGY EXAM  08/10/2023   Diabetic kidney  evaluation - eGFR measurement  09/21/2023   FOOT EXAM  09/21/2023   Diabetic kidney evaluation - Urine ACR  11/08/2023   Medicare Annual Wellness (AWV)  04/10/2024   MAMMOGRAM  06/16/2024   Colonoscopy  06/27/2029   DTaP/Tdap/Td (2 - Tdap) 08/06/2031   DEXA SCAN  Completed   Hepatitis C Screening  Completed   Zoster Vaccines- Shingrix  Completed   HPV VACCINES  Aged Out    Health Maintenance  Health Maintenance Due  Topic Date Due   Pneumonia Vaccine 49+ Years old (2 of 2 - PCV) 09/23/2020   COVID-19 Vaccine (7 - 2023-24 season) 09/20/2022   HEMOGLOBIN A1C  03/21/2023   INFLUENZA VACCINE  04/06/2023    Colorectal cancer screening:  Type of screening: Colonoscopy. Completed 06/27/22. Repeat every 7 years  Mammogram status: Ordered 04/11/23. Pt provided with contact info and advised to call to schedule appt.   Bone Density status: Completed 08/03/20. Results reflect: Bone density results: NORMAL. Repeat every 5 years.  Lung Cancer Screening: (Low Dose CT Chest recommended if Age 103-80 years, 20 pack-year currently smoking OR have quit w/in 15years.) does not qualify.   Lung Cancer Screening Referral: no  Additional Screening:  Hepatitis C Screening: does qualify; Completed 08/05/20  Vision Screening: Recommended annual ophthalmology exams for early detection of glaucoma and other disorders of the eye. Is the patient up to date with their annual eye exam?  Yes  Who is the provider or what is the name of the office in which the patient attends annual eye exams? [facility] If pt is not established with a provider, would they like to be referred to a provider to establish care? Yes .   Dental Screening: Recommended annual dental exams for proper oral hygiene  Diabetic Foot Exam: Diabetic Foot Exam: Completed 09/20/22  Community Resource Referral / Chronic Care Management: CRR required this visit?  No   CCM required this visit?  No     Plan:     I have  personally reviewed and noted the following in the patient's chart:   Medical and social history Use of alcohol, tobacco or illicit drugs  Current medications and supplements including opioid prescriptions. Patient is not currently taking opioid prescriptions. Functional ability and status Nutritional status Physical activity Advanced directives List of other physicians Hospitalizations, surgeries, and ER visits in previous 12 months Vitals Screenings to include cognitive, depression, and falls Referrals and appointments  In addition, I have reviewed and discussed with patient certain preventive protocols, quality metrics, and best practice recommendations. A written personalized care plan for preventive services as well as general preventive health recommendations were provided to patient.     Maryan Puls, LPN   10/10/3662   After Visit Summary: Available in MyChart  Nurse Notes: None

## 2023-04-11 NOTE — Addendum Note (Signed)
Addended by: Maryan Puls on: 04/11/2023 03:24 PM   Modules accepted: Level of Service

## 2023-04-11 NOTE — Patient Instructions (Signed)
Katherine Yu , Thank you for taking time to come for your Medicare Wellness Visit. I appreciate your ongoing commitment to your health goals. Please review the following plan we discussed and let me know if I can assist you in the future.   Referrals/Orders/Follow-Ups/Clinician Recommendations: Aim for 30 minutes of exercise or brisk walking, 6-8 glasses of water, and 5 servings of fruits and vegetables each day.   This is a list of the screening recommended for you and due dates:  Health Maintenance  Topic Date Due   Pneumonia Vaccine (2 of 2 - PCV) 09/23/2020   COVID-19 Vaccine (7 - 2023-24 season) 09/20/2022   Hemoglobin A1C  03/21/2023   Flu Shot  04/06/2023   Medicare Annual Wellness Visit  04/29/2023   Stool Blood Test  06/28/2023   Eye exam for diabetics  08/10/2023   Yearly kidney function blood test for diabetes  09/21/2023   Complete foot exam   09/21/2023   Yearly kidney health urinalysis for diabetes  11/08/2023   Mammogram  06/16/2024   Colon Cancer Screening  06/27/2029   DTaP/Tdap/Td vaccine (2 - Tdap) 08/06/2031   DEXA scan (bone density measurement)  Completed   Hepatitis C Screening  Completed   Zoster (Shingles) Vaccine  Completed   HPV Vaccine  Aged Out    Advanced directives: (Declined) Advance directive discussed with you today. Even though you declined this today, please call our office should you change your mind, and we can give you the proper paperwork for you to fill out.  Next Medicare Annual Wellness Visit scheduled for next year: Yes  Preventive Care 19 Years and Older, Female Preventive care refers to lifestyle choices and visits with your health care provider that can promote health and wellness. What does preventive care include? A yearly physical exam. This is also called an annual well check. Dental exams once or twice a year. Routine eye exams. Ask your health care provider how often you should have your eyes checked. Personal lifestyle choices,  including: Daily care of your teeth and gums. Regular physical activity. Eating a healthy diet. Avoiding tobacco and drug use. Limiting alcohol use. Practicing safe sex. Taking low-dose aspirin every day. Taking vitamin and mineral supplements as recommended by your health care provider. What happens during an annual well check? The services and screenings done by your health care provider during your annual well check will depend on your age, overall health, lifestyle risk factors, and family history of disease. Counseling  Your health care provider may ask you questions about your: Alcohol use. Tobacco use. Drug use. Emotional well-being. Home and relationship well-being. Sexual activity. Eating habits. History of falls. Memory and ability to understand (cognition). Work and work Astronomer. Reproductive health. Screening  You may have the following tests or measurements: Height, weight, and BMI. Blood pressure. Lipid and cholesterol levels. These may be checked every 5 years, or more frequently if you are over 72 years old. Skin check. Lung cancer screening. You may have this screening every year starting at age 55 if you have a 30-pack-year history of smoking and currently smoke or have quit within the past 15 years. Fecal occult blood test (FOBT) of the stool. You may have this test every year starting at age 30. Flexible sigmoidoscopy or colonoscopy. You may have a sigmoidoscopy every 5 years or a colonoscopy every 10 years starting at age 57. Hepatitis C blood test. Hepatitis B blood test. Sexually transmitted disease (STD) testing. Diabetes screening. This is done  by checking your blood sugar (glucose) after you have not eaten for a while (fasting). You may have this done every 1-3 years. Bone density scan. This is done to screen for osteoporosis. You may have this done starting at age 4. Mammogram. This may be done every 1-2 years. Talk to your health care provider  about how often you should have regular mammograms. Talk with your health care provider about your test results, treatment options, and if necessary, the need for more tests. Vaccines  Your health care provider may recommend certain vaccines, such as: Influenza vaccine. This is recommended every year. Tetanus, diphtheria, and acellular pertussis (Tdap, Td) vaccine. You may need a Td booster every 10 years. Zoster vaccine. You may need this after age 20. Pneumococcal 13-valent conjugate (PCV13) vaccine. One dose is recommended after age 80. Pneumococcal polysaccharide (PPSV23) vaccine. One dose is recommended after age 60. Talk to your health care provider about which screenings and vaccines you need and how often you need them. This information is not intended to replace advice given to you by your health care provider. Make sure you discuss any questions you have with your health care provider. Document Released: 09/18/2015 Document Revised: 05/11/2016 Document Reviewed: 06/23/2015 Elsevier Interactive Patient Education  2017 [employer].  Fall Prevention in the Home Falls can cause injuries. They can happen to people of all ages. There are many things you can do to make your home safe and to help prevent falls. What can I do on the outside of my home? Regularly fix the edges of walkways and driveways and fix any cracks. Remove anything that might make you trip as you walk through a door, such as a raised step or threshold. Trim any bushes or trees on the path to your home. Use bright outdoor lighting. Clear any walking paths of anything that might make someone trip, such as rocks or tools. Regularly check to see if handrails are loose or broken. Make sure that both sides of any steps have handrails. Any raised decks and porches should have guardrails on the edges. Have any leaves, snow, or ice cleared regularly. Use sand or salt on walking paths during winter. Clean up any spills in  your garage right away. This includes oil or grease spills. What can I do in the bathroom? Use night lights. Install grab bars by the toilet and in the tub and shower. Do not use towel bars as grab bars. Use non-skid mats or decals in the tub or shower. If you need to sit down in the shower, use a plastic, non-slip stool. Keep the floor dry. Clean up any water that spills on the floor as soon as it happens. Remove soap buildup in the tub or shower regularly. Attach bath mats securely with double-sided non-slip rug tape. Do not have throw rugs and other things on the floor that can make you trip. What can I do in the bedroom? Use night lights. Make sure that you have a light by your bed that is easy to reach. Do not use any sheets or blankets that are too big for your bed. They should not hang down onto the floor. Have a firm chair that has side arms. You can use this for support while you get dressed. Do not have throw rugs and other things on the floor that can make you trip. What can I do in the kitchen? Clean up any spills right away. Avoid walking on wet floors. Keep items that you use  a lot in easy-to-reach places. If you need to reach something above you, use a strong step stool that has a grab bar. Keep electrical cords out of the way. Do not use floor polish or wax that makes floors slippery. If you must use wax, use non-skid floor wax. Do not have throw rugs and other things on the floor that can make you trip. What can I do with my stairs? Do not leave any items on the stairs. Make sure that there are handrails on both sides of the stairs and use them. Fix handrails that are broken or loose. Make sure that handrails are as long as the stairways. Check any carpeting to make sure that it is firmly attached to the stairs. Fix any carpet that is loose or worn. Avoid having throw rugs at the top or bottom of the stairs. If you do have throw rugs, attach them to the floor with carpet  tape. Make sure that you have a light switch at the top of the stairs and the bottom of the stairs. If you do not have them, ask someone to add them for you. What else can I do to help prevent falls? Wear shoes that: Do not have high heels. Have rubber bottoms. Are comfortable and fit you well. Are closed at the toe. Do not wear sandals. If you use a stepladder: Make sure that it is fully opened. Do not climb a closed stepladder. Make sure that both sides of the stepladder are locked into place. Ask someone to hold it for you, if possible. Clearly mark and make sure that you can see: Any grab bars or handrails. First and last steps. Where the edge of each step is. Use tools that help you move around (mobility aids) if they are needed. These include: Canes. Walkers. Scooters. Crutches. Turn on the lights when you go into a dark area. Replace any light bulbs as soon as they burn out. Set up your furniture so you have a clear path. Avoid moving your furniture around. If any of your floors are uneven, fix them. If there are any pets around you, be aware of where they are. Review your medicines with your doctor. Some medicines can make you feel dizzy. This can increase your chance of falling. Ask your doctor what other things that you can do to help prevent falls. This information is not intended to replace advice given to you by your health care provider. Make sure you discuss any questions you have with your health care provider. Document Released: 06/18/2009 Document Revised: 01/28/2016 Document Reviewed: 09/26/2014 Elsevier Interactive Patient Education  2017 [employer].

## 2023-04-17 ENCOUNTER — Encounter: Payer: Self-pay | Admitting: Podiatry

## 2023-04-17 ENCOUNTER — Ambulatory Visit: Payer: PPO | Admitting: Podiatry

## 2023-04-17 DIAGNOSIS — L84 Corns and callosities: Secondary | ICD-10-CM

## 2023-04-17 DIAGNOSIS — E118 Type 2 diabetes mellitus with unspecified complications: Secondary | ICD-10-CM | POA: Diagnosis not present

## 2023-04-17 DIAGNOSIS — L602 Onychogryphosis: Secondary | ICD-10-CM

## 2023-04-17 NOTE — Progress Notes (Signed)
Subjective:  Patient ID: Era Skeen, female    DOB: [birth year],  MRN: [id number]  SANJUANITA HOFFMEYER presents to clinic today for preventative diabetic foot care and painful elongated overgrown toenails which are tender when wearing enclosed shoe gear.  Chief Complaint  Patient presents with   Diabetes   Callouses   Nail Problem   Toe Injury    Patient states she hit her right 5th toe about 6 weeks ago while walking. States it was swollen and she could not move it for a few weeks. Swelling has decreased and she is able to move it again. Overall doing better on today and is able to ambulate in shoe gear without any pain. Also dropped a can of cat food on top of her left foot about the same time. Top of foot was painful and swollen and this has since resolved as well, but she does have a little mark on top of her left foot.   New problem(s): None.   PCP is Mort Sawyers, FNP.  Allergies  Allergen Reactions   Niacin Anaphylaxis, Hives, Itching and Rash   Crestor [Rosuvastatin] Other (See Comments)    Joint pain     Review of Systems: Negative except as noted in the HPI.  Objective: No changes noted in today's physical examination. There were no vitals filed for this visit. SIMONE HINDI is a pleasant 72 y.o. female WD, WN in NAD. AAO x 3.  Vascular Examination: Capillary refill time immediate b/l.  Palpable DP pulse(s) b/l LE. Palpable PT pulse(s) b/l LE. Pedal hair sparse. No pain with calf compression b/l. Lower extremity skin temperature gradient within normal limits. No edema noted b/l LE. No cyanosis or clubbing noted b/l LE.  Neurological Examination: Sensation grossly intact b/l with 10 gram monofilament. Vibratory sensation intact b/l. Protective sensation intact 5/5 intact bilaterally with 10g monofilament b/l. Vibratory sensation intact b/l.  Dermatological Examination:  Pedal skin is warm and supple b/l LE. No open wounds b/l LE. No interdigital macerations noted b/l LE.  Nondystrophic toenails 1-5 bilaterally.  Small transverse area of resolving ecchymosis noted dorsal midfoot LLE. No erythema, no edema, no drainage, no fluctuance, no warmth. Hyperkeratotic lesion(s) medial IPJ of bilateral great toes.  No erythema, no edema, no drainage, no fluctuance.   Musculoskeletal Examination: Muscle strength 5/5 to all lower extremity muscle groups bilaterally. Mild tenderness to lateral aspect of calcaneus. No erythema, no edema, no drainage, no fluctuance. No gross bony deformities bilaterally. Wearing Altra sneakers. Right 5th digit with residual edema. No pain on passive or active ROM.  Radiographs: None  Assessment/Plan: 1. Overgrown toenails   2. Callus   3. Type 2 diabetes mellitus with complication, without long-term current use of insulin (HCC)     Patient was evaluated and treated. All patient's and/or POA's questions/concerns addressed on today's visit. Toenails bilateral great toes, 1-5 left foot, and 1-5 right foot debrided in length and girth without incident. Continue soft, supportive shoe gear daily. Report any pedal injuries to medical professional. Call office if there are any questions/concerns. -Patient with remote h/o injuries to right 5th toe and dorsum of left midfoot. Both in the process of resolving. Monitor for any changes. Call office if there are any. -Continue foot and shoe inspections daily. Monitor blood glucose per PCP/Endocrinologist's recommendations. -Callus(es) bilateral great toes pared utilizing sterile scalpel blade without complication or incident. Total number debrided =2. -Patient/POA to call should there be question/concern in the interim.   Return in about  3 months (around 07/18/2023).  Freddie Breech, DPM

## 2023-04-21 ENCOUNTER — Other Ambulatory Visit (HOSPITAL_COMMUNITY): Payer: Self-pay

## 2023-04-24 ENCOUNTER — Ambulatory Visit: Payer: PPO

## 2023-04-24 DIAGNOSIS — I442 Atrioventricular block, complete: Secondary | ICD-10-CM | POA: Diagnosis not present

## 2023-04-24 LAB — CUP PACEART REMOTE DEVICE CHECK
Battery Remaining Longevity: 122 mo
Battery Voltage: 3.01 V
Brady Statistic AP VP Percent: 0.08 %
Brady Statistic AP VS Percent: 0 %
Brady Statistic AS VP Percent: 99.91 %
Brady Statistic AS VS Percent: 0 %
Brady Statistic RA Percent Paced: 0.08 %
Brady Statistic RV Percent Paced: 100 %
Date Time Interrogation Session: 20240818223147
Implantable Lead Connection Status: 753985
Implantable Lead Connection Status: 753985
Implantable Lead Implant Date: 20220815
Implantable Lead Implant Date: 20220815
Implantable Lead Location: 753859
Implantable Lead Location: 753860
Implantable Lead Model: 3830
Implantable Lead Model: 5076
Implantable Pulse Generator Implant Date: 20220815
Lead Channel Impedance Value: 342 Ohm
Lead Channel Impedance Value: 380 Ohm
Lead Channel Impedance Value: 456 Ohm
Lead Channel Impedance Value: 646 Ohm
Lead Channel Pacing Threshold Amplitude: 0.75 V
Lead Channel Pacing Threshold Amplitude: 1.125 V
Lead Channel Pacing Threshold Pulse Width: 0.4 ms
Lead Channel Pacing Threshold Pulse Width: 0.4 ms
Lead Channel Sensing Intrinsic Amplitude: 10.25 mV
Lead Channel Sensing Intrinsic Amplitude: 2.125 mV
Lead Channel Sensing Intrinsic Amplitude: 2.125 mV
Lead Channel Sensing Intrinsic Amplitude: 5.5 mV
Lead Channel Setting Pacing Amplitude: 1.5 V
Lead Channel Setting Pacing Amplitude: 2.25 V
Lead Channel Setting Pacing Pulse Width: 0.4 ms
Lead Channel Setting Sensing Sensitivity: 0.9 mV
Zone Setting Status: 755011

## 2023-04-26 ENCOUNTER — Other Ambulatory Visit (HOSPITAL_COMMUNITY): Payer: Self-pay

## 2023-04-26 DIAGNOSIS — B372 Candidiasis of skin and nail: Secondary | ICD-10-CM | POA: Diagnosis not present

## 2023-04-26 DIAGNOSIS — R3989 Other symptoms and signs involving the genitourinary system: Secondary | ICD-10-CM | POA: Diagnosis not present

## 2023-04-26 DIAGNOSIS — N812 Incomplete uterovaginal prolapse: Secondary | ICD-10-CM | POA: Diagnosis not present

## 2023-04-26 DIAGNOSIS — L292 Pruritus vulvae: Secondary | ICD-10-CM | POA: Diagnosis not present

## 2023-04-26 MED ORDER — FLUCONAZOLE 150 MG PO TABS
ORAL_TABLET | ORAL | 0 refills | Status: DC
  Filled 2023-04-26: qty 2, 3d supply, fill #0

## 2023-04-26 MED ORDER — CLOTRIMAZOLE-BETAMETHASONE 1-0.05 % EX CREA
TOPICAL_CREAM | CUTANEOUS | 1 refills | Status: DC
  Filled 2023-04-26 (×2): qty 15, 14d supply, fill #0
  Filled 2023-05-19: qty 30, 28d supply, fill #1

## 2023-04-27 ENCOUNTER — Other Ambulatory Visit (HOSPITAL_COMMUNITY): Payer: Self-pay

## 2023-04-27 ENCOUNTER — Other Ambulatory Visit: Payer: Self-pay

## 2023-04-28 ENCOUNTER — Other Ambulatory Visit (HOSPITAL_COMMUNITY): Payer: Self-pay

## 2023-05-04 NOTE — Progress Notes (Signed)
Remote pacemaker transmission.   

## 2023-05-08 ENCOUNTER — Other Ambulatory Visit: Payer: Self-pay | Admitting: Family

## 2023-05-08 DIAGNOSIS — I10 Essential (primary) hypertension: Secondary | ICD-10-CM

## 2023-05-09 ENCOUNTER — Other Ambulatory Visit (HOSPITAL_COMMUNITY): Payer: Self-pay

## 2023-05-09 MED ORDER — HYDROCHLOROTHIAZIDE 25 MG PO TABS
25.0000 mg | ORAL_TABLET | Freq: Every day | ORAL | 1 refills | Status: DC
Start: 2023-05-09 — End: 2023-10-30
  Filled 2023-05-09: qty 90, 90d supply, fill #0
  Filled 2023-07-13 – 2023-07-14 (×2): qty 90, 90d supply, fill #1

## 2023-05-10 ENCOUNTER — Other Ambulatory Visit (HOSPITAL_COMMUNITY): Payer: Self-pay

## 2023-05-29 ENCOUNTER — Other Ambulatory Visit (HOSPITAL_COMMUNITY): Payer: Self-pay

## 2023-05-29 DIAGNOSIS — H524 Presbyopia: Secondary | ICD-10-CM | POA: Diagnosis not present

## 2023-05-29 DIAGNOSIS — H401121 Primary open-angle glaucoma, left eye, mild stage: Secondary | ICD-10-CM | POA: Diagnosis not present

## 2023-05-29 DIAGNOSIS — H04123 Dry eye syndrome of bilateral lacrimal glands: Secondary | ICD-10-CM | POA: Diagnosis not present

## 2023-05-29 DIAGNOSIS — H25813 Combined forms of age-related cataract, bilateral: Secondary | ICD-10-CM | POA: Diagnosis not present

## 2023-05-29 DIAGNOSIS — H40011 Open angle with borderline findings, low risk, right eye: Secondary | ICD-10-CM | POA: Diagnosis not present

## 2023-05-30 ENCOUNTER — Other Ambulatory Visit (HOSPITAL_COMMUNITY): Payer: Self-pay

## 2023-06-06 DIAGNOSIS — N814 Uterovaginal prolapse, unspecified: Secondary | ICD-10-CM | POA: Diagnosis not present

## 2023-06-06 DIAGNOSIS — N898 Other specified noninflammatory disorders of vagina: Secondary | ICD-10-CM | POA: Diagnosis not present

## 2023-06-06 DIAGNOSIS — Z01419 Encounter for gynecological examination (general) (routine) without abnormal findings: Secondary | ICD-10-CM | POA: Diagnosis not present

## 2023-07-03 ENCOUNTER — Ambulatory Visit
Admission: RE | Admit: 2023-07-03 | Discharge: 2023-07-03 | Disposition: A | Discharge status: Home / Self Care | Payer: PPO | Source: Ambulatory Visit | Attending: Family | Admitting: Family

## 2023-07-03 DIAGNOSIS — Z1231 Encounter for screening mammogram for malignant neoplasm of breast: Secondary | ICD-10-CM

## 2023-07-13 ENCOUNTER — Other Ambulatory Visit: Payer: Self-pay | Admitting: Neurology

## 2023-07-13 ENCOUNTER — Other Ambulatory Visit: Payer: Self-pay

## 2023-07-14 ENCOUNTER — Other Ambulatory Visit (HOSPITAL_COMMUNITY): Payer: Self-pay

## 2023-07-17 ENCOUNTER — Other Ambulatory Visit (HOSPITAL_COMMUNITY): Payer: Self-pay

## 2023-07-17 MED ORDER — RIZATRIPTAN BENZOATE 10 MG PO TABS
10.0000 mg | ORAL_TABLET | ORAL | 2 refills | Status: DC | PRN
  Filled 2023-07-17: qty 10, 30d supply, fill #0
  Filled 2023-09-26: qty 10, 30d supply, fill #1
  Filled 2023-10-30: qty 10, 30d supply, fill #2

## 2023-07-17 NOTE — Telephone Encounter (Signed)
Last seen on 09/20/22 Follow up scheduled on 09/19/23  Per 04/25/22 my chart message " Lets do rizatriptan 10 mg #10 with 5 refills "  Rx sent

## 2023-07-20 ENCOUNTER — Encounter: Payer: Self-pay | Admitting: Podiatry

## 2023-07-20 ENCOUNTER — Ambulatory Visit: Payer: PPO | Admitting: Podiatry

## 2023-07-20 VITALS — Ht 66.0 in | Wt 174.4 lb

## 2023-07-20 DIAGNOSIS — L602 Onychogryphosis: Secondary | ICD-10-CM

## 2023-07-20 DIAGNOSIS — M792 Neuralgia and neuritis, unspecified: Secondary | ICD-10-CM | POA: Diagnosis not present

## 2023-07-20 DIAGNOSIS — L84 Corns and callosities: Secondary | ICD-10-CM | POA: Diagnosis not present

## 2023-07-20 DIAGNOSIS — E118 Type 2 diabetes mellitus with unspecified complications: Secondary | ICD-10-CM | POA: Diagnosis not present

## 2023-07-20 DIAGNOSIS — N812 Incomplete uterovaginal prolapse: Secondary | ICD-10-CM | POA: Insufficient documentation

## 2023-07-20 NOTE — Progress Notes (Signed)
  Subjective:  Patient ID: Katherine Yu, female    DOB: [birth year],  MRN: [id number]  72 y.o. female presents preventative diabetic foot care and follow up of calluses of both feet and elongated, tender toenails b/l . Patient states she continues to use the [place] for her neuropathic pain in her feet. She feels it manages her symptoms well. Chief Complaint  Patient presents with   Nail Problem    Pt is here for [facility], Last A1C was 6.8, PCP is DrDugal and LOV was in April.   New problem(s): None   PCP is Mort Sawyers, FNP.  Allergies  Allergen Reactions   Niacin Anaphylaxis, Hives, Itching and Rash   Crestor [Rosuvastatin] Other (See Comments)    Joint pain     Review of Systems: Negative except as noted in the HPI.   Objective:  [name] is a pleasant 72 y.o. female WD, WN in NAD. AAO x 3.  Vascular Examination: Vascular status intact b/l with palpable pedal pulses. CFT immediate b/l. Pedal hair present. No edema. No pain with calf compression b/l. Skin temperature gradient WNL b/l. No varicosities noted. No cyanosis or clubbing noted.  Neurological Examination: Sensation grossly intact b/l with 10 gram monofilament. Vibratory sensation intact b/l.  Dermatological Examination: Pedal skin with normal turgor, texture and tone b/l. No open wounds nor interdigital macerations noted. Nondystrophic toenails elongated x 10. Hyperkeratotic lesion(s) left great toe and right great toe.  No erythema, no edema, no drainage, no fluctuance.  Musculoskeletal Examination: Muscle strength 5/5 to b/l LE.  No pain, crepitus noted b/l. No gross pedal deformities. Patient ambulates independently without assistive aids.   Radiographs: None  Last A1c:      Latest Ref Rng & Units 09/20/2022   10:53 AM  Hemoglobin A1C  Hemoglobin-A1c 4.8 - 5.6 % 5.8    Assessment:   1. Overgrown toenails   2. Callus   3. Neuropathic pain   4. Type 2 diabetes mellitus with complication, without  long-term current use of insulin (HCC)    Plan:  -Consent given for treatment as described below: -Examined patient. -Continue foot and shoe inspections daily. Monitor blood glucose per PCP/Endocrinologist's recommendations. -Patient to continue soft, supportive shoe gear daily. -Nondystrophic toenails trimmed x 10. -Callus(es) left great toe and right great toe pared utilizing sterile scalpel blade without complication or incident. Total number debrided =2. -Patient/POA to call should there be question/concern in the interim.  Return in about 3 months (around 10/20/2023).  Freddie Breech, DPM

## 2023-07-24 ENCOUNTER — Ambulatory Visit (INDEPENDENT_AMBULATORY_CARE_PROVIDER_SITE_OTHER): Payer: PPO

## 2023-07-24 DIAGNOSIS — I442 Atrioventricular block, complete: Secondary | ICD-10-CM | POA: Diagnosis not present

## 2023-07-24 DIAGNOSIS — R002 Palpitations: Secondary | ICD-10-CM

## 2023-07-25 LAB — CUP PACEART REMOTE DEVICE CHECK
Battery Remaining Longevity: 114 mo
Battery Voltage: 3 V
Brady Statistic AP VP Percent: 1.34 %
Brady Statistic AP VS Percent: 0 %
Brady Statistic AS VP Percent: 98.65 %
Brady Statistic AS VS Percent: 0 %
Brady Statistic RA Percent Paced: 1.32 %
Brady Statistic RV Percent Paced: 100 %
Date Time Interrogation Session: 20241117233446
Implantable Lead Connection Status: 753985
Implantable Lead Connection Status: 753985
Implantable Lead Implant Date: 20220815
Implantable Lead Implant Date: 20220815
Implantable Lead Location: 753859
Implantable Lead Location: 753860
Implantable Lead Model: 3830
Implantable Lead Model: 5076
Implantable Pulse Generator Implant Date: 20220815
Lead Channel Impedance Value: 342 Ohm
Lead Channel Impedance Value: 399 Ohm
Lead Channel Impedance Value: 418 Ohm
Lead Channel Impedance Value: 627 Ohm
Lead Channel Pacing Threshold Amplitude: 0.5 V
Lead Channel Pacing Threshold Amplitude: 1.125 V
Lead Channel Pacing Threshold Pulse Width: 0.4 ms
Lead Channel Pacing Threshold Pulse Width: 0.4 ms
Lead Channel Sensing Intrinsic Amplitude: 10.25 mV
Lead Channel Sensing Intrinsic Amplitude: 2.875 mV
Lead Channel Sensing Intrinsic Amplitude: 2.875 mV
Lead Channel Sensing Intrinsic Amplitude: 5.5 mV
Lead Channel Setting Pacing Amplitude: 1.5 V
Lead Channel Setting Pacing Amplitude: 2.5 V
Lead Channel Setting Pacing Pulse Width: 0.4 ms
Lead Channel Setting Sensing Sensitivity: 0.9 mV
Zone Setting Status: 755011

## 2023-08-14 ENCOUNTER — Other Ambulatory Visit: Payer: Self-pay

## 2023-08-14 ENCOUNTER — Other Ambulatory Visit (HOSPITAL_COMMUNITY): Payer: Self-pay

## 2023-08-14 MED ORDER — CLOTRIMAZOLE-BETAMETHASONE 1-0.05 % EX CREA
TOPICAL_CREAM | CUTANEOUS | 0 refills | Status: DC
  Filled 2023-08-14: qty 30, 28d supply, fill #0

## 2023-08-16 NOTE — Progress Notes (Signed)
Remote pacemaker transmission.   

## 2023-09-18 NOTE — Patient Instructions (Signed)
 Below is our plan:  We will continue current treatment plan. Consider valerian root, ashwagandha or L-theanine for OTC sleep aid supplement  Please make sure you are staying well hydrated. I recommend 50-60 ounces daily. Well balanced diet and regular exercise encouraged. Consistent sleep schedule with 6-8 hours recommended.   Please continue follow up with care team as directed.   Follow up with Dr Vear in 1 year   You may receive a survey regarding today's visit. I encourage you to leave honest feed back as I do use this information to improve patient care. Thank you for seeing me today!

## 2023-09-18 NOTE — Progress Notes (Signed)
 Chief Complaint  Patient presents with   Multiple Sclerosis    Rm 1 alone Pt is well and stable, reports no new concerns since last visit.     HISTORY OF PRESENT ILLNESS:  09/19/23 ALL:  Katherine Yu is a 73 y.o. female here today for follow up for RRMS. She stopped DMT (Tecfidera ) 10/2021. No exacerbating symptoms since.   She reports doing fairly well. No changes in gait. She continues to use walking stick. Feels off balance at times. She has a couple of falls per year. No injuries.   Sleep is fair. She has chronic insomnia. She takes melatonin 10mg  at bedtime. She has hx of OSA but unable to tolerate CPAP.   Mood is stable. Maybe better. She seems to worry less. She is exercising more.  Hydroxyzine  used for skin burning/itching. It seems to help a little.   Rizatriptan  works well for migraine abortion. Migraines are improved since being retired. She may take 2-3 doses per month. Rest and getting in a dark room help.   She may have incontinence of bowel once a month or so. She has urinary leaking/urge incontinence at times. She wears a pad. She is not interested in medication management.   She contineus vitamin D  2000iu daily. Labs followed by PCP.   HISTORY (copied from Dr Duncan previous note)  Katherine Yu is a 73 y.o. woman with MS and migraine   Update 09/20/2022 She was diagnosed with MS in 2004.     We stopped the Tecfidera  (DMF) February 2023.     No exacerbation or new symptoms since stopping     She has no recent fall since starting to work with a psychologist, educational.   Gait is off balance.   .She uses the cane if outdoors.   She sees the rail on stairs.  She has a hand grab in the shower.   She denies weakness or numbness.   She notes on exercise equipment that the left leg is weaker even though she does not note with standard activity.   She uses a treadmill and the left foot sometimes catches.  She will be having laser surgery for glaucoma.    She denies diplopia or optic  neuritis.    She is seeing an incontinence specialist (Dr. Alvia, urogynecology,  [facility]) due to incontinence/leakage.   She does pelvic floor exercises bit not much benefit.   She had urodynamic.    She has not been on bladder medication but urology told her they would not help.   She denies hesitancy but does not empty and often needs to go back a couple times.     She notes some fatigue. This has not worsened.      She has insomnia associated with nocturia.  We discussed DDAVP in past but she prefers not to take.  Unsure if melatonin helps  She tries some CBT techniques with mixed benefit..   She has noted reduced focus/attention and reduced short-term memory.  This has not changed much over the past couple of years.   Vascular risks:    She was diagnosed with Type 2 NIDDM in 2022 but had elevated HgBA1c for a few years.  She is doing diet control with HgbA1c 6.4 when checked a month ago.  She does not smoke.   She has controlled essential hypertension.   She does not have hyperlipidemia.      MS History: She was diagnosed with MS in 2004  She had  intense headaches and had an MRI and stumbling     She was placed on Avonex  but had some injection reactions,   She switched to Betaseron and did better.   She then switched to REbif  (unclear why).   She was on Tysabri for several years.  Due to elevated JCV, she switched to one of her previous injectable medication sand then to glatiramer  and then more recently to DMF.  She was switched due to more progression.  Specifically, she was having bladder dysfunction and memory issues and stumbling.      IMAGING: Brain MRI 01/30/2021 shows extensive single and confluent T2/FLAIR hyperintense foci in the hemispheres predominantly in the subcortical and deep white matter with large confluencies in the periatrial white matter and over the frontal horns.  Some foci are in the corpus callosum though but more are pericallosal.  There are no lesions in the  infratentorial white matter.  Normal enhancement pattern.   Cerv/thoracic spine MRI 05/18/2020 shows a normal spinal cord.  She does have multilevel degenerative changes but no significant spinal stenosis.   REVIEW OF SYSTEMS: Out of a complete 14 system review of symptoms, the patient complains only of the following symptoms, fatigue, imbalance, headaches, insomnia, intermittent urinary/bowel incontinence and all other reviewed systems are negative.   ALLERGIES: Allergies  Allergen Reactions   Niacin Anaphylaxis, Hives, Itching and Rash   Crestor  [Rosuvastatin ] Other (See Comments)    Joint pain      HOME MEDICATIONS: Outpatient Medications Prior to Visit  Medication Sig Dispense Refill   aspirin  325 MG tablet Take 325 mg by mouth every 6 (six) hours as needed for mild pain or headache.     benazepril  (LOTENSIN ) 40 MG tablet Take 1 tablet (40 mg) by mouth daily. 90 tablet 3   bismuth subsalicylate (PEPTO BISMOL) 262 MG/15ML suspension Take 30 mLs by mouth every 4 (four) hours as needed for indigestion.     Cholecalciferol (VITAMIN D3) 75 MCG (3000 UT) TABS Take 2,000 mg by mouth daily.     clotrimazole -betamethasone  (LOTRISONE ) cream Apply to affected area 2 times a day for 2-4 weeks 30 g 0   Cranberry-Vitamin C-Probiotic (AZO CRANBERRY) 250-30 MG TABS Take by mouth.     famotidine (PEPCID) 20 MG tablet Take 20 mg by mouth daily as needed for heartburn or indigestion.     hydrochlorothiazide  (HYDRODIURIL ) 25 MG tablet Take 1 tablet  by mouth daily. 90 tablet 1   hydrOXYzine  (ATARAX ) 25 MG tablet Take 1 tablet (25 mg total) by mouth every 6 (six) hours as needed for itching. 30 tablet 5   ibuprofen (ADVIL) 200 MG tablet Take 400 mg by mouth every 6 (six) hours as needed for mild pain.     meclizine (ANTIVERT) 25 MG tablet Take 25 mg by mouth 3 (three) times daily as needed for dizziness.     Melatonin 10 MG TABS Take 10 mg by mouth at bedtime.     metoprolol  succinate (TOPROL -XL) 100  MG 24 hr tablet Take 1 tablet (100 mg total) by mouth daily. Take with or immediately following a meal. 90 tablet 3   ondansetron  (ZOFRAN -ODT) 8 MG disintegrating tablet Dissolve 1 tablet (8 mg total) by mouth every 8 (eight) hours as needed for nausea. 60 tablet 12   rizatriptan  (MAXALT ) 10 MG tablet Take 1 tablet by mouth as needed for migraine. May repeat in 2 hours if needed 10 tablet 2   No facility-administered medications prior to visit.  PAST MEDICAL HISTORY: Past Medical History:  Diagnosis Date   ARTHRITIS 02/20/2009   Qualifier: Diagnosis of  By: Wynetta LATHER, Carol     CHEST PAIN-PRECORDIAL 03/02/2009   Qualifier: Diagnosis of  By: Edith, MD, CODY Ned C    Chronic insomnia    Degenerative arthritis    DEPRESSION 02/20/2009   Qualifier: Diagnosis of  By: Wynetta LATHER Ivanoff     DEPRESSION 02/20/2009   Qualifier: Diagnosis of  By: Wynetta, CMA, Carol     Dyslipidemia    FATIGUE 02/20/2009   Qualifier: Diagnosis of  By: Wynetta CMA, Carol     GERD 02/20/2009   Qualifier: Diagnosis of  By: Wynetta CMA, Carol     History of lower leg fracture    History of peptic ulcer    History of seizures    In childhood   HYPERLIPIDEMIA-MIXED 05/04/2010   Qualifier: Diagnosis of  By: Edith, MD, CODY Ned C    Hypertension    Migraine    Mild obesity    MULTIPLE SCLEROSIS 02/20/2009   Qualifier: Diagnosis of  By: Wynetta, CMA, Carol     Plantar fasciitis    Temporomandibular joint disease    Urinary incontinence      PAST SURGICAL HISTORY: Past Surgical History:  Procedure Laterality Date   APPENDECTOMY     GALLBLADDER SURGERY     metal plate resection     skull   PACEMAKER IMPLANT N/A 04/19/2021   Procedure: PACEMAKER IMPLANT;  Surgeon: Cindie Ole DASEN, MD;  Location: [facility];  Service: Cardiovascular;  Laterality: N/A;   pilonidal cyst       FAMILY HISTORY: Family History  Adopted: Yes  Problem Relation Age of Onset   Lupus Sister    Cancer Sister         bones, brain but not sure where it started   Heart attack Sister    Heart attack Sister    Breast cancer Neg Hx    Colon cancer Neg Hx    Esophageal cancer Neg Hx    Stomach cancer Neg Hx      SOCIAL HISTORY: Social History   Socioeconomic History   Marital status: Married    Spouse name: Jayson   Number of children: 3   Years of education: Masters   Highest education level: Not on file  Occupational History   Occupation: designer, jewellery   Occupation: retired engineer, civil (consulting)  Tobacco Use   Smoking status: Never   Smokeless tobacco: Never  Vaping Use   Vaping status: Never Used  Substance and Sexual Activity   Alcohol use: No   Drug use: No   Sexual activity: Yes    Partners: Male    Birth control/protection: Post-menopausal  Other Topics Concern   Not on file  Social History Narrative   Patient is right handed.   Patient drinks 1 cup caffeine daily.   Lives with husband and son.       10/29/21   From: grew up in [place] but came in 1972    Living: with husband, Jayson (1975) and son   Work: former engineer, civil (consulting), retired due to [employer] impacting memory      Family: 3 children - Lauraine, Beverley (lives with her), [facility] ([place])      Enjoys: reading and walking, meditate      Exercise: walking, and home exercise routine   Diet: diabetic diet      Safety   Seat belts: Yes  Guns: Yes  and in gun safe   Safe in relationships: Yes       Social Drivers of Corporate Investment Banker Strain: Low Risk  (04/11/2023)   Overall Financial Resource Strain (CARDIA)    Difficulty of Paying Living Expenses: Not hard at all  Food Insecurity: No Food Insecurity (04/11/2023)   Hunger Vital Sign    Worried About Running Out of Food in the Last Year: Never true    Ran Out of Food in the Last Year: Never true  Transportation Needs: No Transportation Needs (04/11/2023)   PRAPARE - Administrator, Civil Service (Medical): No    Lack of Transportation (Non-Medical): No  Physical Activity:  Sufficiently Active (04/11/2023)   Exercise Vital Sign    Days of Exercise per Week: 7 days    Minutes of Exercise per Session: 60 min  Stress: No Stress Concern Present (04/11/2023)   [employer] of Occupational Health - Occupational Stress Questionnaire    Feeling of Stress : Not at all  Social Connections: Moderately Integrated (04/11/2023)   Social Connection and Isolation Panel [NHANES]    Frequency of Communication with Friends and Family: More than three times a week    Frequency of Social Gatherings with Friends and Family: More than three times a week    Attends Religious Services: More than 4 times per year    Active Member of [employer] or Organizations: No    Attends Banker Meetings: Never    Marital Status: Married  Catering Manager Violence: Not At Risk (04/11/2023)   Humiliation, Afraid, Rape, and Kick questionnaire    Fear of Current or Ex-Partner: No    Emotionally Abused: No    Physically Abused: No    Sexually Abused: No     PHYSICAL EXAM  Vitals:   09/19/23 1048  BP: 131/78  Pulse: 86  Weight: 182 lb (82.6 kg)  Height: 5' 6 (1.676 m)   Body mass index is 29.38 kg/m.  Generalized: Well developed, in no acute distress  Cardiology: normal rate and rhythm, no murmur auscultated  Respiratory: clear to auscultation bilaterally    Neurological examination  Mentation: Alert oriented to time, place, history taking. Follows all commands speech and language fluent Cranial nerve II-XII: Pupils were equal round reactive to light. Extraocular movements were full, visual field were full on confrontational test. Facial sensation and strength were normal.  Motor: The motor testing reveals 5 over 5 strength of all 4 extremities. Good symmetric motor tone is noted throughout.  Sensory: Sensory testing is intact to soft touch on all 4 extremities. No evidence of extinction is noted.   Gait and station: Station normal. Gait is slightly wide. Using walking stick.     DIAGNOSTIC DATA (LABS, IMAGING, TESTING) - I reviewed patient records, labs, notes, testing and imaging myself where available.  Lab Results  Component Value Date   WBC 4.7 09/20/2022   HGB 14.9 09/20/2022   HCT 43.4 09/20/2022   MCV 89 09/20/2022   PLT 227 09/20/2022      Component Value Date/Time   NA 142 09/20/2022 1053   K 4.2 09/20/2022 1053   CL 102 09/20/2022 1053   CO2 25 09/20/2022 1053   GLUCOSE 122 (H) 09/20/2022 1053   GLUCOSE 111 (H) 04/28/2022 0840   BUN 29 (H) 09/20/2022 1053   CREATININE 1.03 (H) 09/20/2022 1053   CALCIUM  9.7 09/20/2022 1053   PROT 7.3 09/20/2022 1053   ALBUMIN 4.8 09/20/2022  1053   AST 21 09/20/2022 1053   ALT 17 09/20/2022 1053   ALKPHOS 70 09/20/2022 1053   BILITOT 0.4 09/20/2022 1053   GFRNONAA 58 (L) 04/19/2021 1130   GFRNONAA 61 01/18/2019 1322   GFRAA 73 10/31/2019 1107   Lab Results  Component Value Date   CHOL 156 04/28/2022   HDL 42.40 04/28/2022   LDLCALC 87 04/28/2022   TRIG 130.0 04/28/2022   CHOLHDL 4 04/28/2022   Lab Results  Component Value Date   HGBA1C 5.8 (H) 09/20/2022   No results found for: VITAMINB12 Lab Results  Component Value Date   TSH 3.090 09/20/2022        No data to display               No data to display           ASSESSMENT AND PLAN  73 y.o. year old female  has a past medical history of ARTHRITIS (02/20/2009), CHEST PAIN-PRECORDIAL (03/02/2009), Chronic insomnia, Degenerative arthritis, DEPRESSION (02/20/2009), DEPRESSION (02/20/2009), Dyslipidemia, FATIGUE (02/20/2009), GERD (02/20/2009), History of lower leg fracture, History of peptic ulcer, History of seizures, HYPERLIPIDEMIA-MIXED (05/04/2010), Hypertension, Migraine, Mild obesity, MULTIPLE SCLEROSIS (02/20/2009), Plantar fasciitis, Temporomandibular joint disease, and Urinary incontinence. here with    Multiple sclerosis (HCC)  Gait disorder  Urinary urgency  Other fatigue  Chronic insomnia  Katherine Yu is doing  well. We will continue to monitor symptoms off DMT. Last MRI 2024 stable. She will continue hydroxyzine  and rizatriptan  as needed. May consider valerian root OTC for sleep if needed. Could send orders for PT if she wishes. Fall precautions advised. Healthy lifestyle habits encouraged. She will follow up with PCP as directed. She will return to see Dr Vear in 1 year, sooner if needed. She verbalizes understanding and agreement with this plan.   No orders of the defined types were placed in this encounter.    No orders of the defined types were placed in this encounter.    Greig Forbes, MSN, FNP-C 09/19/2023, 11:24 AM  [facility] [address] [place], [place] [address] [phone]

## 2023-09-19 ENCOUNTER — Ambulatory Visit: Payer: PPO | Admitting: Family Medicine

## 2023-09-19 ENCOUNTER — Encounter: Payer: Self-pay | Admitting: Family Medicine

## 2023-09-19 VITALS — BP 131/78 | HR 86 | Ht 66.0 in | Wt 182.0 lb

## 2023-09-19 DIAGNOSIS — F5104 Psychophysiologic insomnia: Secondary | ICD-10-CM | POA: Diagnosis not present

## 2023-09-19 DIAGNOSIS — R3915 Urgency of urination: Secondary | ICD-10-CM | POA: Diagnosis not present

## 2023-09-19 DIAGNOSIS — R5383 Other fatigue: Secondary | ICD-10-CM | POA: Diagnosis not present

## 2023-09-19 DIAGNOSIS — G35 Multiple sclerosis: Secondary | ICD-10-CM

## 2023-09-19 DIAGNOSIS — G35D Multiple sclerosis, unspecified: Secondary | ICD-10-CM

## 2023-09-19 DIAGNOSIS — R269 Unspecified abnormalities of gait and mobility: Secondary | ICD-10-CM

## 2023-10-16 ENCOUNTER — Other Ambulatory Visit: Payer: Self-pay

## 2023-10-16 ENCOUNTER — Other Ambulatory Visit (HOSPITAL_COMMUNITY): Payer: Self-pay

## 2023-10-18 ENCOUNTER — Other Ambulatory Visit (HOSPITAL_COMMUNITY): Payer: Self-pay

## 2023-10-18 MED ORDER — CLOTRIMAZOLE-BETAMETHASONE 1-0.05 % EX CREA
1.0000 | TOPICAL_CREAM | Freq: Two times a day (BID) | CUTANEOUS | 0 refills | Status: AC
  Filled 2023-10-18: qty 30, 30d supply, fill #0

## 2023-10-19 ENCOUNTER — Ambulatory Visit: Payer: PPO | Admitting: Podiatry

## 2023-10-19 ENCOUNTER — Encounter: Payer: Self-pay | Admitting: Podiatry

## 2023-10-19 DIAGNOSIS — E118 Type 2 diabetes mellitus with unspecified complications: Secondary | ICD-10-CM

## 2023-10-19 DIAGNOSIS — L602 Onychogryphosis: Secondary | ICD-10-CM

## 2023-10-19 DIAGNOSIS — Q828 Other specified congenital malformations of skin: Secondary | ICD-10-CM | POA: Diagnosis not present

## 2023-10-19 NOTE — Progress Notes (Signed)
  Subjective:  Patient ID: Katherine Yu, female    DOB: 07/10/1951,  MRN: [id number]  73 y.o. female presents preventative diabetic foot care and painful porokeratotic lesion(s) left 2nd toe and painful overgrown toenails that limit ambulation. Painful toenails interfere with ambulation. Aggravating factors include wearing enclosed shoe gear. Pain is relieved with periodic professional debridement. Painful porokeratotic lesions are aggravated when weightbearing with and without shoegear. Pain is relieved with periodic professional debridement. Chief Complaint  Patient presents with   Nail Problem    Patient is here for routine foot care     New problem(s): None   PCP is Mort Sawyers, FNP , and last visit was November 08, 2022.  Allergies  Allergen Reactions   Niacin Anaphylaxis, Hives, Itching and Rash   Crestor [Rosuvastatin] Other (See Comments)    Joint pain     Review of Systems: Negative except as noted in the HPI.   Objective:  Katherine Yu is a pleasant 73 y.o. female WD, WN in NAD. AAO x 3.  Vascular Examination: Vascular status intact b/l with palpable pedal pulses. CFT immediate b/l. Pedal hair present. No edema. No pain with calf compression b/l. Skin temperature gradient WNL b/l. No varicosities noted. No cyanosis or clubbing noted.  Neurological Examination: Sensation grossly intact b/l with 10 gram monofilament. Vibratory sensation intact b/l.  Dermatological Examination: Pedal skin with normal turgor, texture and tone b/l. No open wounds nor interdigital macerations noted.  Nondystrophic toenails 1-5 bilaterally. Porokeratotic lesion(s) L 2nd toe. No erythema, no edema, no drainage, no fluctuance.  Musculoskeletal Examination: Muscle strength 5/5 to b/l LE.  No pain, crepitus noted b/l. HAV with bunion b/l great toes. Patient ambulates independently without assistive aids.   Radiographs: None  Last A1c:       No data to display           Assessment:    1. Overgrown toenails   2. Porokeratosis   3. Type 2 diabetes mellitus with complication, without long-term current use of insulin (HCC)    Plan:  -Patient was evaluated today. All questions/concerns addressed on today's visit. -Continue foot and shoe inspections daily. Monitor blood glucose per PCP/Endocrinologist's recommendations. -Toenails 1-5 b/l were debrided in length and girth with sterile nail nippers and dremel without iatrogenic bleeding.  -Porokeratotic lesion(s) L 2nd toe pared and enucleated with sterile currette without incident. Total number of lesions debrided=1. -Dispensed tube foam. Apply to right great toe or right 2nd digit every morning. Remove every evening. -Patient/POA to call should there be question/concern in the interim.  Return in about 3 months (around 01/16/2024).  Freddie Breech, DPM      [place] LOCATION: [address] [place] [address]                   Office [phone]   [facility] LOCATION: [address] [place], [place] [address] Office [phone]

## 2023-10-23 ENCOUNTER — Ambulatory Visit (INDEPENDENT_AMBULATORY_CARE_PROVIDER_SITE_OTHER): Payer: 59

## 2023-10-23 DIAGNOSIS — I442 Atrioventricular block, complete: Secondary | ICD-10-CM

## 2023-10-26 LAB — CUP PACEART REMOTE DEVICE CHECK
Battery Remaining Longevity: 128 mo
Battery Voltage: 3 V
Brady Statistic AP VP Percent: 0.47 %
Brady Statistic AP VS Percent: 0 %
Brady Statistic AS VP Percent: 99.52 %
Brady Statistic AS VS Percent: 0.01 %
Brady Statistic RA Percent Paced: 0.46 %
Brady Statistic RV Percent Paced: 99.99 %
Date Time Interrogation Session: 20250219104847
Implantable Lead Connection Status: 753985
Implantable Lead Connection Status: 753985
Implantable Lead Implant Date: 20220815
Implantable Lead Implant Date: 20220815
Implantable Lead Location: 753859
Implantable Lead Location: 753860
Implantable Lead Model: 3830
Implantable Lead Model: 5076
Implantable Pulse Generator Implant Date: 20220815
Lead Channel Impedance Value: 342 Ohm
Lead Channel Impedance Value: 418 Ohm
Lead Channel Impedance Value: 494 Ohm
Lead Channel Impedance Value: 684 Ohm
Lead Channel Pacing Threshold Amplitude: 0.625 V
Lead Channel Pacing Threshold Amplitude: 1 V
Lead Channel Pacing Threshold Pulse Width: 0.4 ms
Lead Channel Pacing Threshold Pulse Width: 0.4 ms
Lead Channel Sensing Intrinsic Amplitude: 10.25 mV
Lead Channel Sensing Intrinsic Amplitude: 2 mV
Lead Channel Sensing Intrinsic Amplitude: 2 mV
Lead Channel Sensing Intrinsic Amplitude: 5.5 mV
Lead Channel Setting Pacing Amplitude: 1.5 V
Lead Channel Setting Pacing Amplitude: 2 V
Lead Channel Setting Pacing Pulse Width: 0.4 ms
Lead Channel Setting Sensing Sensitivity: 0.9 mV
Zone Setting Status: 755011

## 2023-10-27 ENCOUNTER — Encounter: Payer: Self-pay | Admitting: Cardiology

## 2023-10-30 ENCOUNTER — Other Ambulatory Visit: Payer: Self-pay | Admitting: Family

## 2023-10-30 ENCOUNTER — Other Ambulatory Visit (HOSPITAL_COMMUNITY): Payer: Self-pay

## 2023-10-30 ENCOUNTER — Other Ambulatory Visit: Payer: Self-pay

## 2023-10-30 DIAGNOSIS — I10 Essential (primary) hypertension: Secondary | ICD-10-CM

## 2023-10-30 MED ORDER — HYDROCHLOROTHIAZIDE 25 MG PO TABS
25.0000 mg | ORAL_TABLET | Freq: Every day | ORAL | 1 refills | Status: DC
  Filled 2023-10-30: qty 90, 90d supply, fill #0
  Filled 2024-01-30: qty 90, 90d supply, fill #1

## 2023-11-09 ENCOUNTER — Encounter: Payer: Self-pay | Admitting: Family

## 2023-11-09 ENCOUNTER — Ambulatory Visit: Payer: PPO | Admitting: Family

## 2023-11-09 VITALS — BP 132/84 | HR 78 | Temp 97.6°F | Ht 66.5 in | Wt 184.6 lb

## 2023-11-09 DIAGNOSIS — Z1231 Encounter for screening mammogram for malignant neoplasm of breast: Secondary | ICD-10-CM | POA: Diagnosis not present

## 2023-11-09 DIAGNOSIS — Z23 Encounter for immunization: Secondary | ICD-10-CM | POA: Diagnosis not present

## 2023-11-09 DIAGNOSIS — G43009 Migraine without aura, not intractable, without status migrainosus: Secondary | ICD-10-CM

## 2023-11-09 DIAGNOSIS — G35 Multiple sclerosis: Secondary | ICD-10-CM | POA: Diagnosis not present

## 2023-11-09 DIAGNOSIS — Z0000 Encounter for general adult medical examination without abnormal findings: Secondary | ICD-10-CM | POA: Diagnosis not present

## 2023-11-09 DIAGNOSIS — D582 Other hemoglobinopathies: Secondary | ICD-10-CM

## 2023-11-09 DIAGNOSIS — E782 Mixed hyperlipidemia: Secondary | ICD-10-CM | POA: Diagnosis not present

## 2023-11-09 DIAGNOSIS — Z780 Asymptomatic menopausal state: Secondary | ICD-10-CM | POA: Diagnosis not present

## 2023-11-09 DIAGNOSIS — I10 Essential (primary) hypertension: Secondary | ICD-10-CM

## 2023-11-09 DIAGNOSIS — E119 Type 2 diabetes mellitus without complications: Secondary | ICD-10-CM

## 2023-11-09 LAB — COMPREHENSIVE METABOLIC PANEL
ALT: 23 U/L (ref 0–35)
AST: 25 U/L (ref 0–37)
Albumin: 4.8 g/dL (ref 3.5–5.2)
Alkaline Phosphatase: 58 U/L (ref 39–117)
BUN: 35 mg/dL — ABNORMAL HIGH (ref 6–23)
CO2: 30 meq/L (ref 19–32)
Calcium: 10.1 mg/dL (ref 8.4–10.5)
Chloride: 101 meq/L (ref 96–112)
Creatinine, Ser: 1.01 mg/dL (ref 0.40–1.20)
GFR: 56.35 mL/min — ABNORMAL LOW (ref >60.00)
Glucose, Bld: 126 mg/dL — ABNORMAL HIGH (ref 70–99)
Potassium: 4.4 meq/L (ref 3.5–5.1)
Sodium: 140 meq/L (ref 135–145)
Total Bilirubin: 0.4 mg/dL (ref 0.2–1.2)
Total Protein: 8 g/dL (ref 6.0–8.3)

## 2023-11-09 LAB — MICROALBUMIN / CREATININE URINE RATIO
Creatinine,U: 131.2 mg/dL
Microalb Creat Ratio: 18.7 mg/g (ref 0.0–30.0)
Microalb, Ur: 2.5 mg/dL — ABNORMAL HIGH (ref 0.0–1.9)

## 2023-11-09 LAB — VITAMIN B12: Vitamin B-12: 792 pg/mL (ref 211–911)

## 2023-11-09 LAB — LIPID PANEL
Cholesterol: 220 mg/dL — ABNORMAL HIGH (ref 0–200)
HDL: 38.8 mg/dL — ABNORMAL LOW (ref >39.00)
LDL Cholesterol: 140 mg/dL — ABNORMAL HIGH (ref 0–99)
NonHDL: 180.9
Total CHOL/HDL Ratio: 6
Triglycerides: 206 mg/dL — ABNORMAL HIGH (ref 0.0–149.0)
VLDL: 41.2 mg/dL — ABNORMAL HIGH (ref 0.0–40.0)

## 2023-11-09 LAB — HEMOGLOBIN A1C: Hgb A1c MFr Bld: 6.6 % — ABNORMAL HIGH (ref 4.6–6.5)

## 2023-11-09 NOTE — Assessment & Plan Note (Signed)
Continue f/u with neurologist as scheduled. 

## 2023-11-09 NOTE — Assessment & Plan Note (Signed)

## 2023-11-09 NOTE — Assessment & Plan Note (Signed)
Urine microalbumin today.  Pt advised to:  Work on diabetic diet and exercise as tolerated. Yearly foot exam, and annual eye exam.

## 2023-11-09 NOTE — Assessment & Plan Note (Signed)
Improving.  Rizatriptan prn

## 2023-11-09 NOTE — Progress Notes (Signed)
 Subjective:  Patient ID: Katherine Yu, female    DOB: 07-08-1951  Age: 73 y.o. MRN: [id number]  Patient Care Team: Mort Sawyers, FNP as PCP - General (Family Medicine) Rollene Rotunda, MD as PCP - Cardiology (Cardiology) Lanier Prude, MD as PCP - Electrophysiology (Cardiology) Jerrell Mylar, MD as Referring Physician (Urology) Gerald Leitz, MD as Consulting Physician (Obstetrics and Gynecology) Epimenio Foot, Pearletha Furl, MD (Neurology) Mateo Flow, MD as Consulting Physician (Ophthalmology)   CC:  Chief Complaint  Patient presents with   Annual Exam    HPI Katherine Yu is a 73 y.o. female who presents today for an annual physical exam. She reports consuming a general diet.  30 min 3-4 times a week at the [facility]  She generally feels well. She reports sleeping fairly well. She does not have additional problems to discuss today.   Vision:Within last year Dental:Receives regular dental care  Mammogram: 07/03/23 Last pap: > 81 y/o Colonoscopy: 06/27/22 every seven years  Bone density scan: 08/03/20, normal  Pt is without acute concerns.   Migraine, still experiencing them at least 4-5 times a month. Rizatriptan helps when they occur. she states there has been some improvement. Sometimes will last for 24 hours to come out of it, becomes light and sound sensitive. Goes into a dark area. She does state this has improved then prior when she was working. She does follow with neurology, follows annually.    Advanced Directives Patient does not have advanced directives    DEPRESSION SCREENING    11/09/2023   10:15 AM 04/11/2023    2:04 PM 11/08/2022   11:45 AM 04/28/2022    8:37 AM 04/28/2022    8:10 AM 10/29/2021    8:48 AM 05/07/2020    6:21 PM  PHQ 2/9 Scores  PHQ - 2 Score 0 0 0 0 0 0 0  PHQ- 9 Score 0  0   9      ROS: Negative unless specifically indicated above in HPI.    Current Outpatient Medications:    aspirin 325 MG tablet, Take 325 mg by mouth every 6 (six)  hours as needed for mild pain or headache., Disp: , Rfl:    benazepril (LOTENSIN) 40 MG tablet, Take 1 tablet (40 mg) by mouth daily., Disp: 90 tablet, Rfl: 3   bismuth subsalicylate (PEPTO BISMOL) 262 MG/15ML suspension, Take 30 mLs by mouth every 4 (four) hours as needed for indigestion., Disp: , Rfl:    Cholecalciferol (VITAMIN D3) 75 MCG (3000 UT) TABS, Take 2,000 mg by mouth daily., Disp: , Rfl:    clotrimazole-betamethasone (LOTRISONE) cream, Apply to affected area 2 times a day for 2-4 weeks, Disp: 30 g, Rfl: 0   Cranberry-Vitamin C-Probiotic (AZO CRANBERRY) 250-30 MG TABS, Take by mouth., Disp: , Rfl:    famotidine (PEPCID) 20 MG tablet, Take 20 mg by mouth daily as needed for heartburn or indigestion., Disp: , Rfl:    hydrochlorothiazide (HYDRODIURIL) 25 MG tablet, Take 1 tablet  by mouth daily., Disp: 90 tablet, Rfl: 1   hydrOXYzine (ATARAX) 25 MG tablet, Take 1 tablet (25 mg total) by mouth every 6 (six) hours as needed for itching., Disp: 30 tablet, Rfl: 5   ibuprofen (ADVIL) 200 MG tablet, Take 400 mg by mouth every 6 (six) hours as needed for mild pain., Disp: , Rfl:    meclizine (ANTIVERT) 25 MG tablet, Take 25 mg by mouth 3 (three) times daily as needed for dizziness., Disp: , Rfl:  Melatonin 10 MG TABS, Take 10 mg by mouth at bedtime., Disp: , Rfl:    metoprolol succinate (TOPROL-XL) 100 MG 24 hr tablet, Take 1 tablet (100 mg total) by mouth daily. Take with or immediately following a meal., Disp: 90 tablet, Rfl: 3   ondansetron (ZOFRAN-ODT) 8 MG disintegrating tablet, Dissolve 1 tablet (8 mg total) by mouth every 8 (eight) hours as needed for nausea., Disp: 60 tablet, Rfl: 12   rizatriptan (MAXALT) 10 MG tablet, Take 1 tablet by mouth as needed for migraine. May repeat in 2 hours if needed, Disp: 10 tablet, Rfl: 2    Objective:    BP 132/84 (BP Location: Left Arm, Patient Position: Sitting, Cuff Size: Normal)   Pulse 78   Temp 97.6 F (36.4 C) (Temporal)   Ht 5' 6.5" (1.689  m)   Wt 184 lb 9.6 oz (83.7 kg)   SpO2 98%   BMI 29.35 kg/m   BP Readings from Last 3 Encounters:  11/09/23 132/84  09/19/23 131/78  04/11/23 (!) 140/76      Physical Exam Constitutional:      General: She is not in acute distress.    Appearance: Normal appearance. She is normal weight. She is not ill-appearing.  HENT:     Head: Normocephalic.     Right Ear: Tympanic membrane normal.     Left Ear: Tympanic membrane normal.     Nose: Nose normal.     Mouth/Throat:     Mouth: Mucous membranes are moist.  Eyes:     Extraocular Movements: Extraocular movements intact.     Pupils: Pupils are equal, round, and reactive to light.  Cardiovascular:     Rate and Rhythm: Normal rate and regular rhythm.  Pulmonary:     Effort: Pulmonary effort is normal.     Breath sounds: Normal breath sounds.  Abdominal:     General: Abdomen is flat. Bowel sounds are normal.     Palpations: Abdomen is soft.     Tenderness: There is no guarding or rebound.  Musculoskeletal:        General: Normal range of motion.     Cervical back: Normal range of motion.  Skin:    General: Skin is warm.     Capillary Refill: Capillary refill takes less than 2 seconds.  Neurological:     General: No focal deficit present.     Mental Status: She is alert.  Psychiatric:        Mood and Affect: Mood normal.        Behavior: Behavior normal.        Thought Content: Thought content normal.        Judgment: Judgment normal.          Assessment & Plan:  Screening mammogram for breast cancer -     3D Screening Mammogram, Left and Right; Future  Postmenopausal -     DG Bone Density; Future  Essential hypertension Assessment & Plan: Controlled. Continue mctz 25 mg once daily and lotension 40 mg once daily. F/u with cardiology as scheduled.   Mixed hyperlipidemia Assessment & Plan: Statin intolerant. Pt followed by cardiology.    Orders: -     Lipid panel  Type 2 diabetes mellitus without  complication, without long-term current use of insulin (HCC) Assessment & Plan: Urine microalbumin today.  Pt advised to:  Work on diabetic diet and exercise as tolerated. Yearly foot exam, and annual eye exam.     Orders: -  Comprehensive metabolic panel -     Hemoglobin A1c -     Microalbumin / creatinine urine ratio  Multiple sclerosis (HCC) Assessment & Plan: Continue f/u with neurologist as scheduled.   Migraine without aura and without status migrainosus, not intractable Assessment & Plan: Improving.  Rizatriptan prn    Encounter for general adult medical examination without abnormal findings Assessment & Plan: Patient Counseling(The following topics were reviewed):  Preventative care handout given to pt  Health maintenance and immunizations reviewed. Please refer to Health maintenance section. Pt advised on safe sex, wearing seatbelts in car, and proper nutrition labwork ordered today for annual Dental health: Discussed importance of regular tooth brushing, flossing, and dental visits.   Orders: -     Pneumococcal conjugate vaccine 20-valent  Elevated hemoglobin (HCC) -     CBC -     Vitamin B12      Follow-up: Return in about 1 year (around 11/08/2024) for f/u CPE.   Mort Sawyers, FNP

## 2023-11-09 NOTE — Patient Instructions (Signed)
  I have sent an electronic order over to your preferred location for the following:   []   2D Mammogram  [x]   3D Mammogram  [x]   Bone Density   Please give this center a call to get scheduled at your convenience.   [x]   [facility]      [address] [place], [place]        [phone]         Make sure to wear two piece  clothing  No lotions powders or deodorants the day of the appointment Make sure to bring picture ID and insurance card.  Bring list of medications you are currently taking including any supplements.    ------------------------------------

## 2023-11-09 NOTE — Assessment & Plan Note (Signed)
 Statin intolerant. Pt followed by cardiology.

## 2023-11-09 NOTE — Assessment & Plan Note (Signed)
Controlled. Continue mctz 25 mg once daily and lotension 40 mg once daily. F/u with cardiology as scheduled.

## 2023-11-10 LAB — CBC
HCT: 46.1 % — ABNORMAL HIGH (ref 36.0–46.0)
Hemoglobin: 15.2 g/dL — ABNORMAL HIGH (ref 12.0–15.0)
MCHC: 32.9 g/dL (ref 30.0–36.0)
MCV: 94.3 fl (ref 78.0–100.0)
Platelets: 131 10*3/uL — ABNORMAL LOW (ref 150.0–400.0)
RBC: 4.88 Mil/uL (ref 3.87–5.11)
RDW: 13.9 % (ref 11.5–15.5)
WBC: 5.7 10*3/uL (ref 4.0–10.5)

## 2023-11-13 ENCOUNTER — Telehealth: Payer: Self-pay | Admitting: Family

## 2023-11-13 NOTE — Telephone Encounter (Signed)
-----   Message from [facility] [place] [facility] sent at 11/13/2023  3:20 PM EDT -----  ----- Message ----- From: Mort Sawyers, FNP Sent: 11/13/2023   2:52 PM EDT To: Alfonse Alpers Pool  Have pt make appt to discuss. Can be virtual if easier. Just a good amount of things to talk over.

## 2023-11-13 NOTE — Telephone Encounter (Signed)
 Patient has been scheduled

## 2023-11-15 ENCOUNTER — Ambulatory Visit: Admitting: Family

## 2023-11-15 ENCOUNTER — Encounter: Payer: Self-pay | Admitting: Family

## 2023-11-15 VITALS — BP 134/72 | HR 94 | Temp 98.4°F | Ht 66.0 in | Wt 181.4 lb

## 2023-11-15 DIAGNOSIS — E119 Type 2 diabetes mellitus without complications: Secondary | ICD-10-CM | POA: Diagnosis not present

## 2023-11-15 DIAGNOSIS — R809 Proteinuria, unspecified: Secondary | ICD-10-CM

## 2023-11-15 DIAGNOSIS — E782 Mixed hyperlipidemia: Secondary | ICD-10-CM

## 2023-11-15 NOTE — Patient Instructions (Addendum)
  Ask cardiology if you should be taking 81 or 325 mg once daily of aspirin

## 2023-11-15 NOTE — Assessment & Plan Note (Signed)
 Urine m/a reviewed Continue benazepril

## 2023-11-15 NOTE — Progress Notes (Signed)
 Established Patient Office Visit  Subjective:      CC:  Chief Complaint  Patient presents with   Medical Management of Chronic Issues    Discuss lab results    HPI: Katherine Yu is a 73 y.o. female presenting on 11/15/2023 for Medical Management of Chronic Issues (Discuss lab results)  Positive urine microalbumin , is on benazepril   Diabetes: has had a poor diet and not as active recently, but she is willing to move forward with working on diet and exercise. She has no desire to see a nutritionist she knows what she needs to work on.   Hyperlipidemia: ldl is at 140 and total cholesterol 220. She again does report very poor diet. She does not want to start a statin at this time she wants to give it a three month effort to improve    Wt Readings from Last 3 Encounters:  11/15/23 181 lb 6.4 oz (82.3 kg)  11/09/23 184 lb 9.6 oz (83.7 kg)  09/19/23 182 lb (82.6 kg)        Social history:  Relevant past medical, surgical, family and social history reviewed and updated as indicated. Interim medical history since our last visit reviewed.  Allergies and medications reviewed and updated.  DATA REVIEWED: CHART IN EPIC     ROS: Negative unless specifically indicated above in HPI.    Current Outpatient Medications:    aspirin 325 MG tablet, Take 325 mg by mouth every 6 (six) hours as needed for mild pain or headache., Disp: , Rfl:    benazepril (LOTENSIN) 40 MG tablet, Take 1 tablet (40 mg) by mouth daily., Disp: 90 tablet, Rfl: 3   bismuth subsalicylate (PEPTO BISMOL) 262 MG/15ML suspension, Take 30 mLs by mouth every 4 (four) hours as needed for indigestion., Disp: , Rfl:    Cholecalciferol (VITAMIN D3) 75 MCG (3000 UT) TABS, Take 2,000 mg by mouth daily., Disp: , Rfl:    clotrimazole-betamethasone (LOTRISONE) cream, Apply to affected area 2 times a day for 2-4 weeks, Disp: 30 g, Rfl: 0   Cranberry-Vitamin C-Probiotic (AZO CRANBERRY) 250-30 MG TABS, Take by mouth.,  Disp: , Rfl:    famotidine (PEPCID) 20 MG tablet, Take 20 mg by mouth daily as needed for heartburn or indigestion., Disp: , Rfl:    hydrochlorothiazide (HYDRODIURIL) 25 MG tablet, Take 1 tablet  by mouth daily., Disp: 90 tablet, Rfl: 1   hydrOXYzine (ATARAX) 25 MG tablet, Take 1 tablet (25 mg total) by mouth every 6 (six) hours as needed for itching., Disp: 30 tablet, Rfl: 5   ibuprofen (ADVIL) 200 MG tablet, Take 400 mg by mouth every 6 (six) hours as needed for mild pain., Disp: , Rfl:    meclizine (ANTIVERT) 25 MG tablet, Take 25 mg by mouth 3 (three) times daily as needed for dizziness., Disp: , Rfl:    Melatonin 10 MG TABS, Take 10 mg by mouth at bedtime., Disp: , Rfl:    metoprolol succinate (TOPROL-XL) 100 MG 24 hr tablet, Take 1 tablet (100 mg total) by mouth daily. Take with or immediately following a meal., Disp: 90 tablet, Rfl: 3   ondansetron (ZOFRAN-ODT) 8 MG disintegrating tablet, Dissolve 1 tablet (8 mg total) by mouth every 8 (eight) hours as needed for nausea., Disp: 60 tablet, Rfl: 12   rizatriptan (MAXALT) 10 MG tablet, Take 1 tablet by mouth as needed for migraine. May repeat in 2 hours if needed, Disp: 10 tablet, Rfl: 2  Objective:    BP 134/72 (BP Location: Left Arm, Patient Position: Sitting, Cuff Size: Normal)   Pulse 94   Temp 98.4 F (36.9 C) (Temporal)   Ht 5\' 6"  (1.676 m)   Wt 181 lb 6.4 oz (82.3 kg)   SpO2 96%   BMI 29.28 kg/m   Wt Readings from Last 3 Encounters:  11/15/23 181 lb 6.4 oz (82.3 kg)  11/09/23 184 lb 9.6 oz (83.7 kg)  09/19/23 182 lb (82.6 kg)    Physical Exam Constitutional:      General: She is not in acute distress.    Appearance: Normal appearance. She is obese. She is not ill-appearing, toxic-appearing or diaphoretic.  HENT:     Head: Normocephalic.  Cardiovascular:     Rate and Rhythm: Normal rate and regular rhythm.  Pulmonary:     Effort: Pulmonary effort is normal.  Musculoskeletal:        General: Normal range of  motion.  Neurological:     General: No focal deficit present.     Mental Status: She is alert and oriented to person, place, and time. Mental status is at baseline.  Psychiatric:        Mood and Affect: Mood normal.        Behavior: Behavior normal.        Thought Content: Thought content normal.        Judgment: Judgment normal.           Assessment & Plan:  Type 2 diabetes mellitus without complication, without long-term current use of insulin (HCC) Assessment & Plan: Pt would like to try lifestyle change as she states she has a lot she know she needs to change.  Will repeat in three months. Work on diabetic diet and exercise as tolerated. Yearly foot exam, and annual eye exam.     Microalbuminuria Assessment & Plan: Urine m/a reviewed Continue benazepril   Mixed hyperlipidemia Assessment & Plan: Pt wants to try lifestyle change Repeat lipid panel in three months.  Did d/w high risk stroke/mi pt aware        Return in about 3 months (around 02/15/2024) for f/u cholesterol.  Mort Sawyers, MSN, APRN, FNP-C  [facility]

## 2023-11-15 NOTE — Assessment & Plan Note (Signed)
 Pt wants to try lifestyle change Repeat lipid panel in three months.  Did d/w high risk stroke/mi pt aware

## 2023-11-15 NOTE — Assessment & Plan Note (Signed)
 Pt would like to try lifestyle change as she states she has a lot she know she needs to change.  Will repeat in three months. Work on diabetic diet and exercise as tolerated. Yearly foot exam, and annual eye exam.

## 2023-11-16 ENCOUNTER — Encounter: Payer: Self-pay | Admitting: Cardiology

## 2023-11-17 ENCOUNTER — Encounter: Payer: Self-pay | Admitting: Family

## 2023-11-17 ENCOUNTER — Other Ambulatory Visit: Payer: Self-pay | Admitting: Family

## 2023-11-17 DIAGNOSIS — I10 Essential (primary) hypertension: Secondary | ICD-10-CM

## 2023-11-17 DIAGNOSIS — I442 Atrioventricular block, complete: Secondary | ICD-10-CM

## 2023-11-17 MED ORDER — ASPIRIN 81 MG PO TBEC: 81.0000 mg | DELAYED_RELEASE_TABLET | Freq: Every day | ORAL | Status: AC

## 2023-11-28 NOTE — Progress Notes (Signed)
 Remote pacemaker transmission.

## 2023-11-28 NOTE — Addendum Note (Signed)
 Addended by: Geralyn Flash D on: 11/28/2023 02:04 PM   Modules accepted: Orders

## 2023-12-07 DIAGNOSIS — H40011 Open angle with borderline findings, low risk, right eye: Secondary | ICD-10-CM | POA: Diagnosis not present

## 2023-12-07 DIAGNOSIS — H524 Presbyopia: Secondary | ICD-10-CM | POA: Diagnosis not present

## 2023-12-07 DIAGNOSIS — H04123 Dry eye syndrome of bilateral lacrimal glands: Secondary | ICD-10-CM | POA: Diagnosis not present

## 2023-12-07 DIAGNOSIS — H401121 Primary open-angle glaucoma, left eye, mild stage: Secondary | ICD-10-CM | POA: Diagnosis not present

## 2023-12-18 ENCOUNTER — Other Ambulatory Visit: Payer: Self-pay | Admitting: Neurology

## 2023-12-19 ENCOUNTER — Other Ambulatory Visit (HOSPITAL_COMMUNITY): Payer: Self-pay

## 2023-12-19 MED ORDER — RIZATRIPTAN BENZOATE 10 MG PO TABS
10.0000 mg | ORAL_TABLET | ORAL | 9 refills | Status: DC | PRN
  Filled 2023-12-19: qty 10, 30d supply, fill #0
  Filled 2024-04-10 – 2024-04-15 (×2): qty 10, 30d supply, fill #1
  Filled 2024-07-10: qty 10, 30d supply, fill #2

## 2023-12-19 MED ORDER — HYDROXYZINE HCL 25 MG PO TABS
25.0000 mg | ORAL_TABLET | Freq: Four times a day (QID) | ORAL | 5 refills | Status: DC | PRN
  Filled 2023-12-19: qty 30, 8d supply, fill #0
  Filled 2024-03-12: qty 30, 8d supply, fill #1
  Filled 2024-04-10 – 2024-04-15 (×2): qty 30, 8d supply, fill #2
  Filled 2024-04-25: qty 30, 8d supply, fill #3
  Filled 2024-07-10: qty 30, 8d supply, fill #4
  Filled 2024-10-02: qty 30, 8d supply, fill #5

## 2024-01-10 ENCOUNTER — Other Ambulatory Visit (HOSPITAL_COMMUNITY): Payer: Self-pay

## 2024-01-10 ENCOUNTER — Other Ambulatory Visit: Payer: Self-pay | Admitting: Cardiology

## 2024-01-10 MED ORDER — METOPROLOL SUCCINATE ER 100 MG PO TB24
100.0000 mg | ORAL_TABLET | Freq: Every day | ORAL | 3 refills | Status: AC
  Filled 2024-01-10: qty 90, 90d supply, fill #0
  Filled 2024-04-10 – 2024-04-12 (×2): qty 90, 90d supply, fill #1
  Filled 2024-07-10: qty 90, 90d supply, fill #2
  Filled 2024-10-08: qty 90, 90d supply, fill #3

## 2024-01-18 ENCOUNTER — Encounter: Payer: Self-pay | Admitting: Podiatry

## 2024-01-18 ENCOUNTER — Ambulatory Visit: Payer: PPO | Admitting: Podiatry

## 2024-01-18 DIAGNOSIS — E118 Type 2 diabetes mellitus with unspecified complications: Secondary | ICD-10-CM

## 2024-01-18 DIAGNOSIS — L602 Onychogryphosis: Secondary | ICD-10-CM

## 2024-01-18 DIAGNOSIS — Q828 Other specified congenital malformations of skin: Secondary | ICD-10-CM | POA: Diagnosis not present

## 2024-01-18 NOTE — Progress Notes (Signed)
  Subjective:  Patient ID: Katherine Yu, female    DOB: 1951/01/06,  MRN: [id number]  73 y.o. female presents preventative diabetic foot care and painful porokeratotic lesions left foot. Pain prevent(s) comfortable ambulation. Aggravating factor is weightbearing with and without shoegear. Patient states she had a fall a couple of weeks ago and hit her right great toe as well as injured her ankle. States both are doing well now.  Chief Complaint  Patient presents with   Diabetes    "It's a three month check-up of my feet, I'm Diabetic."   New problem(s): None   PCP is Felicita Horns, FNP , and last visit was November 15, 2023.  Allergies  Allergen Reactions   Niacin Anaphylaxis, Hives, Itching and Rash   Crestor  [Rosuvastatin ] Other (See Comments)    Joint pain     Review of Systems: Negative except as noted in the HPI.   Objective:  Katherine Yu is a pleasant 73 y.o. female WD, WN in NAD. AAO x 3.  Vascular Examination: Vascular status intact b/l with palpable pedal pulses. CFT immediate b/l. Pedal hair present. No edema. No pain with calf compression b/l. Skin temperature gradient WNL b/l. No varicosities noted. No cyanosis or clubbing noted.  Neurological Examination: Sensation grossly intact b/l with 10 gram monofilament. Vibratory sensation intact b/l.  Dermatological Examination: Pedal skin with normal turgor, texture and tone b/l. No open wounds nor interdigital macerations noted. Nondystrophic toenails 1-5 bilaterally. Minimal hyperkeratos(is/es) noted bilateral great toes. Porokeratotic lesion(s) left second digit. No erythema, no edema, no drainage, no fluctuance. b/l.   Musculoskeletal Examination: Muscle strength 5/5 to b/l LE.  No pain, crepitus noted b/l. No gross pedal deformities. Patient ambulates independently without assistive aids.   Radiographs: None  Last A1c:      Latest Ref Rng & Units 11/09/2023   10:39 AM  Hemoglobin A1C  Hemoglobin-A1c 4.6 - 6.5 %  6.6      Assessment:   1. Overgrown toenails   2. Porokeratosis   3. Type 2 diabetes mellitus with complication, without long-term current use of insulin (HCC)    Plan:  -Consent given for treatment as described below: -Examined patient. -Continue foot and shoe inspections daily. Monitor blood glucose per PCP/Endocrinologist's recommendations. -Nondystrophic toenails trimmed 1-5 bilaterally. -Porokeratotic lesion(s) left second digit pared and enucleated with sterile currette without incident. Total number of lesions debrided=1. -Patient/POA to call should there be question/concern in the interim.  Return in about 3 months (around 04/19/2024).  Luella Sager, DPM      [place] LOCATION: [address] [place] [address]                   Office [phone]   [facility] LOCATION: [address] [place], [place] [address] Office [phone]

## 2024-01-22 ENCOUNTER — Ambulatory Visit (INDEPENDENT_AMBULATORY_CARE_PROVIDER_SITE_OTHER): Payer: 59

## 2024-01-22 DIAGNOSIS — I442 Atrioventricular block, complete: Secondary | ICD-10-CM | POA: Diagnosis not present

## 2024-01-23 ENCOUNTER — Ambulatory Visit: Payer: Self-pay | Admitting: Cardiology

## 2024-01-23 LAB — CUP PACEART REMOTE DEVICE CHECK
Battery Remaining Longevity: 116 mo
Battery Voltage: 3 V
Brady Statistic AP VP Percent: 10.88 %
Brady Statistic AP VS Percent: 0 %
Brady Statistic AS VP Percent: 88.99 %
Brady Statistic AS VS Percent: 0.13 %
Brady Statistic RA Percent Paced: 10.88 %
Brady Statistic RV Percent Paced: 99.87 %
Date Time Interrogation Session: 20250518202704
Implantable Lead Connection Status: 753985
Implantable Lead Connection Status: 753985
Implantable Lead Implant Date: 20220815
Implantable Lead Implant Date: 20220815
Implantable Lead Location: 753859
Implantable Lead Location: 753860
Implantable Lead Model: 3830
Implantable Lead Model: 5076
Implantable Pulse Generator Implant Date: 20220815
Lead Channel Impedance Value: 361 Ohm
Lead Channel Impedance Value: 399 Ohm
Lead Channel Impedance Value: 570 Ohm
Lead Channel Impedance Value: 684 Ohm
Lead Channel Pacing Threshold Amplitude: 0.75 V
Lead Channel Pacing Threshold Amplitude: 1.125 V
Lead Channel Pacing Threshold Pulse Width: 0.4 ms
Lead Channel Pacing Threshold Pulse Width: 0.4 ms
Lead Channel Sensing Intrinsic Amplitude: 1.625 mV
Lead Channel Sensing Intrinsic Amplitude: 1.625 mV
Lead Channel Sensing Intrinsic Amplitude: 10.25 mV
Lead Channel Sensing Intrinsic Amplitude: 5.5 mV
Lead Channel Setting Pacing Amplitude: 1.75 V
Lead Channel Setting Pacing Amplitude: 2.25 V
Lead Channel Setting Pacing Pulse Width: 0.4 ms
Lead Channel Setting Sensing Sensitivity: 0.9 mV
Zone Setting Status: 755011

## 2024-01-23 NOTE — Progress Notes (Signed)
  Electrophysiology Office Follow up Visit Note:    Date:  01/24/2024   ID:  Katherine Yu, Katherine Yu [birth year], MRN [id number]  PCP:  Felicita Horns, FNP  [facility] Cardiologist:  Eilleen Grates, MD  [facility] Electrophysiologist:  Boyce Byes, MD    Interval History:     Katherine Yu is a 73 y.o. female who presents for a follow up visit.   The patient has a history of complete heart block with a permanent pacemaker in place.  The patient last saw Ottie Blonder in clinic Jan 23, 2023.  The patient last saw Dr. Lavonne Prairie March 01, 2019 for.  She takes metoprolol .  She has a dual-chamber permanent pacemaker  Today she is doing well.  No complaints.  Good energy level.  She has the [facility] to exercise regularly.      Past medical, surgical, social and family history were reviewed.  ROS:   Please see the history of present illness.    All other systems reviewed and are negative.  EKGs/Labs/Other Studies Reviewed:    The following studies were reviewed today:  Jan 24, 2024 in clinic device interrogation personally reviewed Battery and lead parameter stable. Ventricular lead dependent today. No programming changes made today.         Physical Exam:    VS:  BP 138/84   Pulse 92   Ht 5\' 6"  (1.676 m)   Wt 177 lb (80.3 kg)   SpO2 94%   BMI 28.57 kg/m     Wt Readings from Last 3 Encounters:  01/24/24 177 lb (80.3 kg)  11/15/23 181 lb 6.4 oz (82.3 kg)  11/09/23 184 lb 9.6 oz (83.7 kg)     GEN: no distress CARD: RRR, No MRG.  CIED pocket well-healed. RESP: No IWOB. CTAB.      ASSESSMENT:    1. Complete heart block (HCC)   2. CHB (complete heart block) (HCC)   3. Pacemaker   4. Palpitations    PLAN:    In order of problems listed above:  #Complete heart block #Permanent pacemaker in situ Device functioning appropriately.  Continue remote monitoring. Given she ventricular paces 100% of the time, we will get an  echocardiogram.  #Palpitations Continue metoprolol   Follow-up 1 year with EP APP.  Signed, Harvie Liner, MD, [facility], [facility] 01/24/2024 10:39 AM    Electrophysiology [facility]

## 2024-01-24 ENCOUNTER — Ambulatory Visit: Payer: PPO | Attending: Cardiology | Admitting: Cardiology

## 2024-01-24 VITALS — BP 138/84 | HR 92 | Ht 66.0 in | Wt 177.0 lb

## 2024-01-24 DIAGNOSIS — R002 Palpitations: Secondary | ICD-10-CM | POA: Diagnosis not present

## 2024-01-24 DIAGNOSIS — Z950 Presence of cardiac pacemaker: Secondary | ICD-10-CM

## 2024-01-24 DIAGNOSIS — I442 Atrioventricular block, complete: Secondary | ICD-10-CM

## 2024-01-24 NOTE — Patient Instructions (Signed)
 Medication Instructions:  Your physician recommends that you continue on your current medications as directed. Please refer to the Current Medication list given to you today.  *If you need a refill on your cardiac medications before your next appointment, please call your pharmacy*   Testing/Procedures: Echocardiogram  Your physician has requested that you have an echocardiogram. Echocardiography is a painless test that uses sound waves to create images of your heart. It provides your doctor with information about the size and shape of your heart and how well your heart's chambers and valves are working. This procedure takes approximately one hour. There are no restrictions for this procedure. Please do NOT wear cologne, perfume, aftershave, or lotions (deodorant is allowed). Please arrive 15 minutes prior to your appointment time.  Please note: We ask at that you not bring children with you during ultrasound (echo/ vascular) testing. Due to room size and safety concerns, children are not allowed in the ultrasound rooms during exams. Our front office staff cannot provide observation of children in our lobby area while testing is being conducted. An adult accompanying a patient to their appointment will only be allowed in the ultrasound room at the discretion of the ultrasound technician under special circumstances. We apologize for any inconvenience.   Follow-Up: At [facility], you and your health needs are our priority.  As part of our continuing mission to provide you with exceptional heart care, our providers are all part of one team.  This team includes your primary Cardiologist (physician) and Advanced Practice Providers or APPs (Physician Assistants and Nurse Practitioners) who all work together to provide you with the care you need, when you need it.  Your next appointment:   1 year  Provider:   You will see one of the following Advanced Practice Providers on your designated  Care Team:   Mertha Abrahams, Kennard Pea [address] [place], PA-C Suzann Riddle, NP Creighton Doffing, NP

## 2024-01-30 LAB — CUP PACEART INCLINIC DEVICE CHECK
Date Time Interrogation Session: 20250521111253
Implantable Lead Connection Status: 753985
Implantable Lead Connection Status: 753985
Implantable Lead Implant Date: 20220815
Implantable Lead Implant Date: 20220815
Implantable Lead Location: 753859
Implantable Lead Location: 753860
Implantable Lead Model: 3830
Implantable Lead Model: 5076
Implantable Pulse Generator Implant Date: 20220815

## 2024-01-31 ENCOUNTER — Ambulatory Visit: Payer: Self-pay | Admitting: Cardiology

## 2024-02-04 ENCOUNTER — Other Ambulatory Visit: Payer: Self-pay | Admitting: Family

## 2024-02-05 ENCOUNTER — Other Ambulatory Visit (HOSPITAL_COMMUNITY): Payer: Self-pay

## 2024-02-05 ENCOUNTER — Other Ambulatory Visit (HOSPITAL_BASED_OUTPATIENT_CLINIC_OR_DEPARTMENT_OTHER): Payer: Self-pay

## 2024-02-05 MED ORDER — BENAZEPRIL HCL 40 MG PO TABS
40.0000 mg | ORAL_TABLET | Freq: Every day | ORAL | 3 refills | Status: AC
  Filled 2024-02-05: qty 90, 90d supply, fill #0
  Filled 2024-04-25: qty 90, 90d supply, fill #1
  Filled 2024-07-10: qty 90, 90d supply, fill #2

## 2024-02-06 ENCOUNTER — Ambulatory Visit: Attending: Cardiology

## 2024-02-06 DIAGNOSIS — R002 Palpitations: Secondary | ICD-10-CM | POA: Diagnosis not present

## 2024-02-06 DIAGNOSIS — I442 Atrioventricular block, complete: Secondary | ICD-10-CM | POA: Diagnosis not present

## 2024-02-06 DIAGNOSIS — Z950 Presence of cardiac pacemaker: Secondary | ICD-10-CM

## 2024-02-06 LAB — ECHOCARDIOGRAM COMPLETE
AR max vel: 3.28 cm2
AV Area VTI: 3.2 cm2
AV Area mean vel: 3.11 cm2
AV Mean grad: 3 mmHg
AV Peak grad: 5.2 mmHg
Ao pk vel: 1.14 m/s
Area-P 1/2: 3.42 cm2
Calc EF: 61.8 %
S' Lateral: 2.5 cm
Single Plane A2C EF: 61.6 %
Single Plane A4C EF: 62.2 %

## 2024-02-15 ENCOUNTER — Encounter: Payer: Self-pay | Admitting: Family

## 2024-02-15 ENCOUNTER — Ambulatory Visit: Admitting: Family

## 2024-02-15 VITALS — BP 160/82 | HR 80 | Temp 98.0°F | Ht 66.0 in | Wt 181.0 lb

## 2024-02-15 DIAGNOSIS — E782 Mixed hyperlipidemia: Secondary | ICD-10-CM

## 2024-02-15 DIAGNOSIS — E1122 Type 2 diabetes mellitus with diabetic chronic kidney disease: Secondary | ICD-10-CM | POA: Diagnosis not present

## 2024-02-15 DIAGNOSIS — I129 Hypertensive chronic kidney disease with stage 1 through stage 4 chronic kidney disease, or unspecified chronic kidney disease: Secondary | ICD-10-CM | POA: Diagnosis not present

## 2024-02-15 DIAGNOSIS — E119 Type 2 diabetes mellitus without complications: Secondary | ICD-10-CM

## 2024-02-15 DIAGNOSIS — I10 Essential (primary) hypertension: Secondary | ICD-10-CM | POA: Diagnosis not present

## 2024-02-15 NOTE — Assessment & Plan Note (Signed)
 Discussed goals  Urine m/a positive on benazepril   Continue diabetic diet and exercise

## 2024-02-15 NOTE — Assessment & Plan Note (Addendum)
 Ordering bmp to monitor today pending results Blood pressure elevated in office Advised pt to check blood pressure once daily for the next one week and report back.

## 2024-02-15 NOTE — Progress Notes (Signed)
 Established Patient Office Visit  Subjective:      CC:  Chief Complaint  Patient presents with   Medical Management of Chronic Issues    3 month follow up    HPI: Katherine Yu is a 73 y.o. female presenting on 02/15/2024 for Medical Management of Chronic Issues (3 month follow up)  HLD: LDL 140 total 220 trig 206. Tried rosuvastatin  in the past due to myalgias.   DM2: A1c at 6.6 with the one prior 1 year ago was 5.8.   For both disease processes pt requested lifeestyle changes vs medication management x 3 months and then we would re evaluate.   Diet: has tried to cut down on some fatty greasy foods and has increased her time at the [facility]. Cardiologist had recommended 30 min on the treadmill and then spends another hour working on strength exercising routines. She is doing about six days a week of this.   Positive urine m/a, currently taking benazepril  40 mg once daily.   CKD: last gfr 56.35.   New complaints: Recently saw cardiology in May for her pacemaker f/u. She was having ventricular paces 100% of the time so they ordered an echocardiogram, she is still pending results.   Palpitations, on metoprolol .   Had a fall back in early may and hit her right great toe and injured her ankle. She was seen and evaluated by her podiatrist, and she had improved greatly. She states doing ok, and at current without symptoms just wanted to let me know.    Social history:  Relevant past medical, surgical, family and social history reviewed and updated as indicated. Interim medical history since our last visit reviewed.  Allergies and medications reviewed and updated.  DATA REVIEWED: CHART IN EPIC     ROS: Negative unless specifically indicated above in HPI.    Current Outpatient Medications:    aspirin  EC 81 MG tablet, Take 1 tablet (81 mg total) by mouth daily. Swallow whole., Disp: , Rfl:    benazepril  (LOTENSIN ) 40 MG tablet, Take 1 tablet (40 mg) by mouth daily., Disp: 90  tablet, Rfl: 3   bismuth subsalicylate (PEPTO BISMOL) 262 MG/15ML suspension, Take 30 mLs by mouth every 4 (four) hours as needed for indigestion., Disp: , Rfl:    Cholecalciferol (VITAMIN D3) 75 MCG (3000 UT) TABS, Take 2,000 mg by mouth daily., Disp: , Rfl:    clotrimazole -betamethasone  (LOTRISONE ) cream, Apply to affected area 2 times a day for 2-4 weeks, Disp: 30 g, Rfl: 0   Cranberry-Vitamin C-Probiotic (AZO CRANBERRY) 250-30 MG TABS, Take by mouth., Disp: , Rfl:    famotidine (PEPCID) 20 MG tablet, Take 20 mg by mouth daily as needed for heartburn or indigestion., Disp: , Rfl:    hydrochlorothiazide  (HYDRODIURIL ) 25 MG tablet, Take 1 tablet  by mouth daily., Disp: 90 tablet, Rfl: 1   hydrOXYzine  (ATARAX ) 25 MG tablet, Take 1 tablet (25 mg total) by mouth every 6 (six) hours as needed for itching., Disp: 30 tablet, Rfl: 5   ibuprofen (ADVIL) 200 MG tablet, Take 400 mg by mouth every 6 (six) hours as needed for mild pain., Disp: , Rfl:    meclizine (ANTIVERT) 25 MG tablet, Take 25 mg by mouth 3 (three) times daily as needed for dizziness., Disp: , Rfl:    Melatonin 10 MG TABS, Take 10 mg by mouth at bedtime., Disp: , Rfl:    metoprolol  succinate (TOPROL -XL) 100 MG 24 hr tablet, Take 1 tablet (100 mg total)  by mouth daily. Take with or immediately following a meal., Disp: 90 tablet, Rfl: 3   ondansetron  (ZOFRAN -ODT) 8 MG disintegrating tablet, Dissolve 1 tablet (8 mg total) by mouth every 8 (eight) hours as needed for nausea., Disp: 60 tablet, Rfl: 12   rizatriptan  (MAXALT ) 10 MG tablet, Take 1 tablet by mouth as needed for migraine. May repeat in 2 hours if needed, Disp: 10 tablet, Rfl: 9      Objective:    BP (!) 160/82   Pulse 80   Temp 98 F (36.7 C) (Temporal)   Ht 5' 6 (1.676 m)   Wt 181 lb (82.1 kg)   SpO2 98%   BMI 29.21 kg/m   Wt Readings from Last 3 Encounters:  02/15/24 181 lb (82.1 kg)  01/24/24 177 lb (80.3 kg)  11/15/23 181 lb 6.4 oz (82.3 kg)    Physical  Exam Constitutional:      General: She is not in acute distress.    Appearance: Normal appearance. She is normal weight. She is not ill-appearing, toxic-appearing or diaphoretic.  HENT:     Head: Normocephalic.   Cardiovascular:     Rate and Rhythm: Normal rate and regular rhythm.  Pulmonary:     Effort: Pulmonary effort is normal.   Musculoskeletal:        General: Normal range of motion.   Neurological:     General: No focal deficit present.     Mental Status: She is alert and oriented to person, place, and time. Mental status is at baseline.   Psychiatric:        Mood and Affect: Mood normal.        Behavior: Behavior normal.        Thought Content: Thought content normal.        Judgment: Judgment normal.           Assessment & Plan:  Essential hypertension  Mixed hyperlipidemia Assessment & Plan: Discussed goal LDL < 70 and advised of sclerosis aortic valve Ordered lipid panel, pending results. 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       Return in about 6 months (around 08/16/2024) for f/u diabetes.  Felicita Horns, MSN, APRN, FNP-C [facility]

## 2024-02-15 NOTE — Assessment & Plan Note (Signed)
 Discussed goal LDL < 70 and advised of sclerosis aortic valve Ordered lipid panel, pending results. Work on low cholesterol diet and exercise as tolerated

## 2024-02-15 NOTE — Addendum Note (Signed)
 Addended by: Felicita Horns on: 02/15/2024 07:51 AM   Modules accepted: Orders

## 2024-02-16 LAB — LIPID PANEL
Cholesterol: 217 mg/dL — ABNORMAL HIGH (ref <200)
HDL: 43 mg/dL — ABNORMAL LOW (ref >=50)
LDL Cholesterol (Calc): 140 mg/dL — ABNORMAL HIGH
Non-HDL Cholesterol (Calc): 174 mg/dL — ABNORMAL HIGH (ref <130)
Total CHOL/HDL Ratio: 5 (calc) — ABNORMAL HIGH (ref <5.0)
Triglycerides: 207 mg/dL — ABNORMAL HIGH (ref <150)

## 2024-02-16 LAB — BASIC METABOLIC PANEL WITH GFR
BUN/Creatinine Ratio: 27 (calc) — ABNORMAL HIGH (ref 6–22)
BUN: 30 mg/dL — ABNORMAL HIGH (ref 7–25)
CO2: 28 mmol/L (ref 20–32)
Calcium: 10.7 mg/dL — ABNORMAL HIGH (ref 8.6–10.4)
Chloride: 100 mmol/L (ref 98–110)
Creat: 1.1 mg/dL — ABNORMAL HIGH (ref 0.60–1.00)
Glucose, Bld: 134 mg/dL — ABNORMAL HIGH (ref 65–99)
Potassium: 4.1 mmol/L (ref 3.5–5.3)
Sodium: 138 mmol/L (ref 135–146)
eGFR: 54 mL/min/{1.73_m2} — ABNORMAL LOW (ref >=60)

## 2024-02-16 LAB — HEMOGLOBIN A1C
Hgb A1c MFr Bld: 6.4 % — ABNORMAL HIGH (ref <5.7)
Mean Plasma Glucose: 137 mg/dL
eAG (mmol/L): 7.6 mmol/L

## 2024-02-19 ENCOUNTER — Ambulatory Visit: Payer: Self-pay | Admitting: Family

## 2024-02-19 DIAGNOSIS — M791 Myalgia, unspecified site: Secondary | ICD-10-CM

## 2024-02-19 DIAGNOSIS — E782 Mixed hyperlipidemia: Secondary | ICD-10-CM

## 2024-02-19 DIAGNOSIS — Z789 Other specified health status: Secondary | ICD-10-CM

## 2024-02-19 DIAGNOSIS — N1831 Chronic kidney disease, stage 3a: Secondary | ICD-10-CM

## 2024-02-21 ENCOUNTER — Other Ambulatory Visit (HOSPITAL_COMMUNITY): Payer: Self-pay

## 2024-02-21 MED ORDER — LOVASTATIN 20 MG PO TABS
20.0000 mg | ORAL_TABLET | Freq: Every day | ORAL | 3 refills | Status: DC
  Filled 2024-02-21: qty 90, 90d supply, fill #0
  Filled 2024-04-23 – 2024-05-16 (×2): qty 90, 90d supply, fill #1

## 2024-02-21 NOTE — Addendum Note (Signed)
 Addended by: Felicita Horns on: 02/21/2024 12:22 PM   Modules accepted: Orders

## 2024-02-22 ENCOUNTER — Encounter: Payer: Self-pay | Admitting: Family

## 2024-03-07 NOTE — Addendum Note (Signed)
 Addended by: TAWNI DRILLING D on: 03/07/2024 10:09 AM   Modules accepted: Orders

## 2024-03-07 NOTE — Progress Notes (Signed)
 Remote pacemaker transmission.

## 2024-03-15 ENCOUNTER — Encounter: Payer: Self-pay | Admitting: Cardiology

## 2024-04-11 ENCOUNTER — Other Ambulatory Visit: Payer: Self-pay

## 2024-04-16 ENCOUNTER — Ambulatory Visit: Payer: PPO

## 2024-04-16 VITALS — BP 124/78 | Ht 66.5 in | Wt 181.0 lb

## 2024-04-16 DIAGNOSIS — Z0000 Encounter for general adult medical examination without abnormal findings: Secondary | ICD-10-CM | POA: Diagnosis not present

## 2024-04-16 NOTE — Progress Notes (Signed)
 Subjective:   Katherine Yu is a 73 y.o. who presents for a Medicare Wellness preventive visit.  As a reminder, Annual Wellness Visits don't include a physical exam, and some assessments may be limited, especially if this visit is performed virtually. We may recommend an in-person follow-up visit with your provider if needed.  Visit Complete: In person  Persons Participating in Visit: Patient.  AWV Questionnaire: Yes: Patient Medicare AWV questionnaire was completed by the patient on 04/12/24; I have confirmed that all information answered by patient is correct and no changes since this date.  Cardiac Risk Factors include: advanced age (>17men, >41 women);diabetes mellitus;dyslipidemia;hypertension     Objective:    Today's Vitals   04/16/24 1010  Weight: 181 lb (82.1 kg)  Height: 5' 6.5 (1.689 m)   Body mass index is 28.78 kg/m.     04/16/2024   10:18 AM 04/11/2023    2:05 PM 04/28/2022    8:09 AM 04/19/2021   10:34 AM 05/07/2020    6:20 PM 10/15/2014    7:59 AM  Advanced Directives  Does Patient Have a Medical Advance Directive? No No No No No No   Would patient like information on creating a medical advance directive?  No - Patient declined Yes (MAU/Ambulatory/Procedural Areas - Information given) No - Patient declined No - Patient declined      Data saved with a previous flowsheet row definition    Current Medications (verified) Outpatient Encounter Medications as of 04/16/2024  Medication Sig   aspirin  EC 81 MG tablet Take 1 tablet (81 mg total) by mouth daily. Swallow whole.   benazepril  (LOTENSIN ) 40 MG tablet Take 1 tablet (40 mg) by mouth daily.   bismuth subsalicylate (PEPTO BISMOL) 262 MG/15ML suspension Take 30 mLs by mouth every 4 (four) hours as needed for indigestion.   Cholecalciferol (VITAMIN D3) 75 MCG (3000 UT) TABS Take 2,000 mg by mouth daily.   clotrimazole -betamethasone  (LOTRISONE ) cream Apply to affected area 2 times a day for 2-4 weeks    Cranberry-Vitamin C-Probiotic (AZO CRANBERRY) 250-30 MG TABS Take by mouth.   famotidine (PEPCID) 20 MG tablet Take 20 mg by mouth daily as needed for heartburn or indigestion.   hydrochlorothiazide  (HYDRODIURIL ) 25 MG tablet Take 1 tablet  by mouth daily.   hydrOXYzine  (ATARAX ) 25 MG tablet Take 1 tablet (25 mg total) by mouth every 6 (six) hours as needed for itching.   ibuprofen (ADVIL) 200 MG tablet Take 400 mg by mouth every 6 (six) hours as needed for mild pain.   lovastatin  (MEVACOR ) 20 MG tablet Take 1 tablet (20 mg total) by mouth at bedtime.   meclizine (ANTIVERT) 25 MG tablet Take 25 mg by mouth 3 (three) times daily as needed for dizziness.   Melatonin 10 MG TABS Take 10 mg by mouth at bedtime.   metoprolol  succinate (TOPROL -XL) 100 MG 24 hr tablet Take 1 tablet (100 mg total) by mouth daily. Take with or immediately following a meal.   ondansetron  (ZOFRAN -ODT) 8 MG disintegrating tablet Dissolve 1 tablet (8 mg total) by mouth every 8 (eight) hours as needed for nausea.   rizatriptan  (MAXALT ) 10 MG tablet Take 1 tablet by mouth as needed for migraine. May repeat in 2 hours if needed   No facility-administered encounter medications on file as of 04/16/2024.    Allergies (verified) Niacin, Crestor  [rosuvastatin ], Atorvastatin, and Simvastatin   History: Past Medical History:  Diagnosis Date   Allergy Niacin   ARTHRITIS 02/20/2009   Qualifier:  Diagnosis of  By: Wynetta CMA, Niels Christain [facility]   CHEST PAIN-PRECORDIAL 03/02/2009   Qualifier: Diagnosis of  By: Edith, MD, CODY Ned C    Chronic insomnia    Degenerative arthritis    DEPRESSION 02/20/2009   Qualifier: Diagnosis of  By: Wynetta LATHER, Carol     DEPRESSION 02/20/2009   Qualifier: Diagnosis of  By: Wynetta, CMA, Carol     Diabetes mellitus without complication (HCC) Type 06 April 2020   recent HgbA1C 08/05/21 6.4   Dyslipidemia    FATIGUE 02/20/2009   Qualifier: Diagnosis of  By: Wynetta CMA, Carol      GERD 02/20/2009   Qualifier: Diagnosis of  By: Wynetta CMA, Carol     Glaucoma    History of lower leg fracture    History of peptic ulcer    History of seizures    In childhood   HYPERLIPIDEMIA-MIXED 05/04/2010   Qualifier: Diagnosis of  By: Edith, MD, CODY Ned C    Hypertension    Migraine    Mild obesity    MULTIPLE SCLEROSIS 02/20/2009   Qualifier: Diagnosis of  By: Wynetta, CMA, Carol     Neuromuscular disorder [facility]) MS 2004   Plantar fasciitis    Sleep apnea verified on sleep study   Temporomandibular joint disease    Urinary incontinence    Past Surgical History:  Procedure Laterality Date   APPENDECTOMY     BRAIN SURGERY  repair depressed skull fx before age 73   CHOLECYSTECTOMY  ?1983   EYE SURGERY     FRACTURE SURGERY  leg fx before the age of 48   GALLBLADDER SURGERY     metal plate resection     skull   PACEMAKER IMPLANT N/A 04/19/2021   Procedure: PACEMAKER IMPLANT;  Surgeon: Cindie Ole DASEN, MD;  Location: [facility] INVASIVE CV LAB;  Service: Cardiovascular;  Laterality: N/A;   pilonidal cyst     Family History  Adopted: Yes  Problem Relation Age of Onset   Anxiety disorder Mother    Cancer Mother    Depression Mother    Heart disease Mother    Alcohol abuse Father    Cancer Father    Lupus Sister    Arthritis Sister    Depression Sister    Heart disease Sister    Cancer Sister        bones, brain but not sure where it started   Heart attack Sister    Heart attack Sister    Early death Sister    Heart disease Sister    Obesity Sister    Obesity Sister    Early death Sister    Heart disease Sister    Obesity Sister    Obesity Sister    Breast cancer Neg Hx    Colon cancer Neg Hx    Esophageal cancer Neg Hx    Stomach cancer Neg Hx    Social History   Socioeconomic History   Marital status: Married    Spouse name: Jayson   Number of children: 3   Years of education: Masters   [employer] education level: Master's degree (e.g., MA, MS,  MEng, MEd, MSW, MBA)  Occupational History   Occupation: Designer, jewellery   Occupation: retired Engineer, civil (consulting)  Tobacco Use   Smoking status: Never   Smokeless tobacco: Never  Vaping Use   Vaping status: Never Used  Substance and Sexual Activity   Alcohol use: No   Drug  use: No   Sexual activity: Yes    Partners: Male    Birth control/protection: Post-menopausal  Other Topics Concern   Not on file  Social History Narrative   Patient is right handed.   Patient drinks 1 cup caffeine daily.   Lives with husband and son.       10/29/21   From: grew up in [place] but came in 1972    Living: with husband, Jayson (1975) and son   Work: former Engineer, civil (consulting), retired due to [employer] impacting memory      Family: 3 children - Lauraine, Beverley (lives with her), [facility] ([place])      Enjoys: reading and walking, meditate      Exercise: walking, and home exercise routine   Diet: diabetic diet      Safety   Seat belts: Yes    Guns: Yes  and in gun safe   Safe in relationships: Yes       Social Drivers of Corporate investment banker Strain: Low Risk  (04/12/2024)   Overall Financial Resource Strain (CARDIA)    Difficulty of Paying Living Expenses: Not hard at all  Food Insecurity: No Food Insecurity (04/12/2024)   Hunger Vital Sign    Worried About Running Out of Food in the Last Year: Never true    Ran Out of Food in the Last Year: Never true  Transportation Needs: No Transportation Needs (04/12/2024)   PRAPARE - Administrator, Civil Service (Medical): No    Lack of Transportation (Non-Medical): No  Physical Activity: Sufficiently Active (04/12/2024)   Exercise Vital Sign    Days of Exercise per Week: 5 days    Minutes of Exercise per Session: 90 min  Stress: No Stress Concern Present (04/12/2024)   [employer] of Occupational Health - Occupational Stress Questionnaire    Feeling of Stress: Only a little  Social Connections: Moderately Integrated (04/12/2024)   Social Connection and Isolation  Panel    Frequency of Communication with Friends and Family: More than three times a week    Frequency of Social Gatherings with Friends and Family: Patient declined    Attends Religious Services: Patient declined    Database administrator or Organizations: Yes    Attends Engineer, structural: More than 4 times per year    Marital Status: Married    Tobacco Counseling Counseling given: Not Answered   Clinical Intake:  Pre-visit preparation completed: Yes  Pain : No/denies pain    BMI - recorded: 28.78 Nutritional Risks: Nausea/ vomitting/ diarrhea (has due to MS) Diabetes: Yes CBG done?: No Did pt. bring in CBG monitor from home?: No  Lab Results  Component Value Date   HGBA1C 6.4 (H) 02/15/2024   HGBA1C 6.6 (H) 11/09/2023   HGBA1C 5.8 (H) 09/20/2022     How often do you need to have someone help you when you read instructions, pamphlets, or other written materials from your doctor or pharmacy?: 1 - Never  Interpreter Needed?: No  Comments: lives with husband Information entered by :: B.Nikole Swartzentruber,LPN   Activities of Daily Living     04/12/2024   10:54 PM  In your present state of health, do you have any difficulty performing the following activities:  Hearing? 0  Vision? 0  Difficulty concentrating or making decisions? 0  Walking or climbing stairs? 0  Dressing or bathing? 0  Doing errands, shopping? 0  Preparing Food and eating ? N  Using the Toilet?  N  In the past six months, have you accidently leaked urine? Y  Do you have problems with loss of bowel control? Y  Managing your Medications? N  Managing your Finances? Y  Housekeeping or managing your Housekeeping? Y    Patient Care Team: Corwin Antu, FNP as PCP - General (Family Medicine) Lavona Agent, MD as PCP - Cardiology (Cardiology) Cindie Ole DASEN, MD as PCP - Electrophysiology (Cardiology) Alvia Dorothyann LABOR, MD as Referring Physician (Urology) Rosalva Sawyer, MD as Consulting  Physician (Obstetrics and Gynecology) Vear, Charlie LABOR, MD (Neurology) Cleatus Collar, MD as Consulting Physician (Ophthalmology) [facility], [facility]  I have updated your Care Teams any recent Medical Services you may have received from other providers in the past year.     Assessment:   This is a routine wellness examination for Katherine Yu.  Hearing/Vision screen Hearing Screening - Comments:: Pt says her hearing is good Vision Screening - Comments:: Pt says her vision is good w/glasses [facility]   Goals Addressed             This Visit's Progress    Patient Stated       04/16/24-Keep blood sugar under control, and decrease falls, and lose weight.     COMPLETED: Weight (lb) < 160 lb (72.6 kg)         Depression Screen     04/16/2024   10:16 AM 11/09/2023   10:15 AM 04/11/2023    2:04 PM 11/08/2022   11:45 AM 04/28/2022    8:37 AM 04/28/2022    8:10 AM 10/29/2021    8:48 AM  PHQ 2/9 Scores  PHQ - 2 Score 0 0 0 0 0 0 0  PHQ- 9 Score  0  0   9    Fall Risk     04/12/2024   10:54 PM 02/15/2024    7:24 AM 11/09/2023   10:15 AM 04/11/2023    2:06 PM 04/10/2023    2:49 PM  Fall Risk   Falls in the past year? 1 1 0 1 1  Number falls in past yr: 1 0 0 1 1  Injury with Fall? 1 0 0 0 1  Comment did not go to ER   [place]   Risk for fall due to : No Fall Risks No Fall Risks No Fall Risks Impaired balance/gait   Follow up Education provided;Falls prevention discussed Falls evaluation completed Falls evaluation completed Falls evaluation completed;Falls prevention discussed;Education provided     MEDICARE RISK AT HOME:  Medicare Risk at Home Any stairs in or around the home?: (Patient-Rptd) Yes If so, are there any without handrails?: (Patient-Rptd) No Home free of loose throw rugs in walkways, pet beds, electrical cords, etc?: (Patient-Rptd) Yes Adequate lighting in your home to reduce risk of falls?: (Patient-Rptd) Yes Life alert?: (Patient-Rptd) No Use of a cane,  walker or w/c?: (Patient-Rptd) Yes Grab bars in the bathroom?: (Patient-Rptd) Yes Shower chair or bench in shower?: (Patient-Rptd) No Elevated toilet seat or a handicapped toilet?: (Patient-Rptd) No  TIMED UP AND GO:  Was the test performed?  Yes  Length of time to ambulate 10 feet: 11 sec Gait steady and fast without use of assistive device  Cognitive Function: 6CIT completed        04/16/2024   10:20 AM 04/11/2023    2:09 PM  6CIT Screen  What Year? 0 points 0 points  What month? 0 points 0 points  What time? 0 points 0 points  Count  back from 20 0 points 4 points  Months in reverse 0 points 0 points  Repeat phrase 2 points 4 points  Total Score 2 points 8 points    Immunizations Immunization History  Administered Date(s) Administered   Influenza-Unspecified 07/26/2022   PFIZER(Purple Top)SARS-COV-2 Vaccination 11/15/2019, 11/24/2019, 12/09/2019, 06/15/2020, 12/29/2020, 06/21/2021   PNEUMOCOCCAL CONJUGATE-20 11/09/2023   Pfizer Covid-19 Vaccine Bivalent Booster 66yrs & up 07/26/2022   Pneumococcal Polysaccharide-23 09/24/2019   Td 08/05/2021   Tdap 08/03/2011   Varicella 01/24/2020, 04/02/2020   Zoster Recombinant(Shingrix) 01/24/2020, 04/02/2020    Screening Tests Health Maintenance  Topic Date Due   COVID-19 Vaccine (8 - 2024-25 season) 05/07/2023   INFLUENZA VACCINE  04/05/2024   HEMOGLOBIN A1C  08/16/2024   FOOT EXAM  10/18/2024   Diabetic kidney evaluation - Urine ACR  11/08/2024   OPHTHALMOLOGY EXAM  12/06/2024   Diabetic kidney evaluation - eGFR measurement  02/14/2025   Medicare Annual Wellness (AWV)  04/16/2025   MAMMOGRAM  07/02/2025   Colonoscopy  06/27/2029   DTaP/Tdap/Td (3 - Td or Tdap) 08/06/2031   Pneumococcal Vaccine: 50+ Years  Completed   DEXA SCAN  Completed   Hepatitis C Screening  Completed   Zoster Vaccines- Shingrix  Completed   Hepatitis B Vaccines  Aged Out   HPV VACCINES  Aged Out   Meningococcal B Vaccine  Aged Out    Health  Maintenance  Health Maintenance Due  Topic Date Due   COVID-19 Vaccine (8 - 2024-25 season) 05/07/2023   INFLUENZA VACCINE  04/05/2024   Health Maintenance Items Addressed: None due at this time  Additional Screening:  Vision Screening: Recommended annual ophthalmology exams for early detection of glaucoma and other disorders of the eye. Would you like a referral to an eye doctor? No    Dental Screening: Recommended annual dental exams for proper oral hygiene  Community Resource Referral / Chronic Care Management: CRR required this visit?  No   CCM required this visit?  No   Plan:    I have personally reviewed and noted the following in the patient's chart:   Medical and social history Use of alcohol, tobacco or illicit drugs  Current medications and supplements including opioid prescriptions. Patient is not currently taking opioid prescriptions. Functional ability and status Nutritional status Physical activity Advanced directives List of other physicians Hospitalizations, surgeries, and ER visits in previous 12 months Vitals Screenings to include cognitive, depression, and falls Referrals and appointments  In addition, I have reviewed and discussed with patient certain preventive protocols, quality metrics, and best practice recommendations. A written personalized care plan for preventive services as well as general preventive health recommendations were provided to patient.   Erminio LITTIE Saris, LPN   [id number]   After Visit Summary: (MyChart) Due to this being a telephonic visit, the after visit summary with patients personalized plan was offered to patient via MyChart   Notes: Nothing significant to report at this time.

## 2024-04-16 NOTE — Patient Instructions (Signed)
 Katherine Yu , Thank you for taking time out of your busy schedule to complete your Annual Wellness Visit with me. I enjoyed our conversation and look forward to speaking with you again next year. I, as well as your care team,  appreciate your ongoing commitment to your health goals. Please review the following plan we discussed and let me know if I can assist you in the future. Your Game plan/ To Do List    Referrals: If you haven't heard from the office you've been referred to, please reach out to them at the phone provided.   Follow up Visits: We will see or speak with you next year for your Next Medicare AWV with our clinical staff Have you seen your provider in the last 6 months (3 months if uncontrolled diabetes)? Yes  Clinician Recommendations:  Aim for 30 minutes of exercise or brisk walking, 6-8 glasses of water, and 5 servings of fruits and vegetables each day.       This is a list of the screenings recommended for you:  Health Maintenance  Topic Date Due   COVID-19 Vaccine (8 - 2024-25 season) 05/07/2023   Eye exam for diabetics  02/05/2024   Flu Shot  04/05/2024   Hemoglobin A1C  08/16/2024   Complete foot exam   10/18/2024   Yearly kidney health urinalysis for diabetes  11/08/2024   Yearly kidney function blood test for diabetes  02/14/2025   Medicare Annual Wellness Visit  04/16/2025   Mammogram  07/02/2025   Colon Cancer Screening  06/27/2029   DTaP/Tdap/Td vaccine (3 - Td or Tdap) 08/06/2031   Pneumococcal Vaccine for age over 90  Completed   DEXA scan (bone density measurement)  Completed   Hepatitis C Screening  Completed   Zoster (Shingles) Vaccine  Completed   Hepatitis B Vaccine  Aged Out   HPV Vaccine  Aged Out   Meningitis B Vaccine  Aged Out    Advanced directives: (Declined) Advance directive discussed with you today. Even though you declined this today, please call our office should you change your mind, and we can give you the proper paperwork for you to  fill out. Advance Care Planning is important because it:  [x]  Makes sure you receive the medical care that is consistent with your values, goals, and preferences  [x]  It provides guidance to your family and loved ones and reduces their decisional burden about whether or not they are making the right decisions based on your wishes.  Follow the link provided in your after visit summary or read over the paperwork we have mailed to you to help you started getting your Advance Directives in place. If you need assistance in completing these, please reach out to us  so that we can help you!

## 2024-04-19 ENCOUNTER — Ambulatory Visit: Admitting: Podiatry

## 2024-04-19 ENCOUNTER — Encounter: Payer: Self-pay | Admitting: Podiatry

## 2024-04-19 DIAGNOSIS — E118 Type 2 diabetes mellitus with unspecified complications: Secondary | ICD-10-CM

## 2024-04-19 DIAGNOSIS — L602 Onychogryphosis: Secondary | ICD-10-CM | POA: Diagnosis not present

## 2024-04-22 ENCOUNTER — Ambulatory Visit (INDEPENDENT_AMBULATORY_CARE_PROVIDER_SITE_OTHER): Payer: 59

## 2024-04-22 DIAGNOSIS — I442 Atrioventricular block, complete: Secondary | ICD-10-CM

## 2024-04-23 ENCOUNTER — Other Ambulatory Visit (HOSPITAL_COMMUNITY): Payer: Self-pay

## 2024-04-23 ENCOUNTER — Other Ambulatory Visit: Payer: Self-pay | Admitting: Family

## 2024-04-23 DIAGNOSIS — I10 Essential (primary) hypertension: Secondary | ICD-10-CM

## 2024-04-24 ENCOUNTER — Other Ambulatory Visit (HOSPITAL_COMMUNITY): Payer: Self-pay

## 2024-04-24 LAB — CUP PACEART REMOTE DEVICE CHECK
Battery Remaining Longevity: 107 mo
Battery Voltage: 3 V
Brady Statistic AP VP Percent: 14.53 %
Brady Statistic AP VS Percent: 0 %
Brady Statistic AS VP Percent: 85.46 %
Brady Statistic AS VS Percent: 0.01 %
Brady Statistic RA Percent Paced: 14.46 %
Brady Statistic RV Percent Paced: 99.99 %
Date Time Interrogation Session: 20250817182739
Implantable Lead Connection Status: 753985
Implantable Lead Connection Status: 753985
Implantable Lead Implant Date: 20220815
Implantable Lead Implant Date: 20220815
Implantable Lead Location: 753859
Implantable Lead Location: 753860
Implantable Lead Model: 3830
Implantable Lead Model: 5076
Implantable Pulse Generator Implant Date: 20220815
Lead Channel Impedance Value: 323 Ohm
Lead Channel Impedance Value: 361 Ohm
Lead Channel Impedance Value: 513 Ohm
Lead Channel Impedance Value: 646 Ohm
Lead Channel Pacing Threshold Amplitude: 0.75 V
Lead Channel Pacing Threshold Amplitude: 1.25 V
Lead Channel Pacing Threshold Pulse Width: 0.4 ms
Lead Channel Pacing Threshold Pulse Width: 0.4 ms
Lead Channel Sensing Intrinsic Amplitude: 10.25 mV
Lead Channel Sensing Intrinsic Amplitude: 2.375 mV
Lead Channel Sensing Intrinsic Amplitude: 2.375 mV
Lead Channel Sensing Intrinsic Amplitude: 5.5 mV
Lead Channel Setting Pacing Amplitude: 1.5 V
Lead Channel Setting Pacing Amplitude: 2.5 V
Lead Channel Setting Pacing Pulse Width: 0.4 ms
Lead Channel Setting Sensing Sensitivity: 0.9 mV
Zone Setting Status: 755011

## 2024-04-24 MED ORDER — HYDROCHLOROTHIAZIDE 25 MG PO TABS
25.0000 mg | ORAL_TABLET | Freq: Every day | ORAL | 1 refills | Status: AC
Start: 2024-04-24 — End: ?
  Filled 2024-04-24: qty 90, 90d supply, fill #0
  Filled 2024-07-10: qty 90, 90d supply, fill #1

## 2024-04-25 ENCOUNTER — Ambulatory Visit: Payer: Self-pay | Admitting: Cardiology

## 2024-04-25 ENCOUNTER — Other Ambulatory Visit: Payer: Self-pay

## 2024-04-25 ENCOUNTER — Other Ambulatory Visit (HOSPITAL_COMMUNITY): Payer: Self-pay

## 2024-04-25 NOTE — Progress Notes (Signed)
  Subjective:  Patient ID: Katherine Yu, female    DOB: 12-25-1950,  MRN: [id number]  Katherine Yu presents to clinic today for preventative diabetic foot care and painful elongated overgrown toenails which are tender when wearing enclosed shoe gear.  Chief Complaint  Patient presents with   Nail Problem    Thick painful toenails, 3 month follow up    New problem(s): None.   PCP is Corwin Antu, FNP. Katherine Yu 02/15/2024.  Allergies  Allergen Reactions   Niacin Anaphylaxis, Hives, Itching and Rash   Crestor  [Rosuvastatin ] Other (See Comments)    Joint pain    Atorvastatin Other (See Comments)    myalgias   Simvastatin Other (See Comments)    Muscle aches    Review of Systems: Negative except as noted in the HPI.  Objective: No changes noted in today's physical examination. There were no vitals filed for this visit. Katherine Yu is a pleasant 73 y.o. female WD, WN in NAD. AAO x 3.  Vascular Examination: Capillary refill time immediate b/l. Palpable pedal pulses. Pedal hair present b/l. Pedal edema absent. No pain with calf compression b/l. Skin temperature gradient WNL b/l. No cyanosis or clubbing b/l. No ischemia or gangrene noted b/l.   Neurological Examination: Sensation grossly intact b/l with 10 gram monofilament. Vibratory sensation intact b/l.   Dermatological Examination: Pedal skin with normal turgor, texture and tone b/l.  No open wounds. No interdigital macerations.   Nondystrophic toenails 1-5 bilaterally.   No hyperkeratotic nor porokeratotic lesions.  Musculoskeletal Examination: Muscle strength 5/5 to all lower extremity muscle groups bilaterally. No pain, crepitus or joint limitation noted with ROM b/l LE. No gross bony pedal deformities b/l. Patient ambulates independently without assistive aids.  Radiographs: None  Last A1c:      Latest Ref Rng & Units 02/15/2024    8:06 AM 11/09/2023   10:39 AM  Hemoglobin A1C  Hemoglobin-A1c <5.7 % 6.4  6.6     Assessment/Plan: 1. Overgrown toenails   2. Type 2 diabetes mellitus with complication, without long-term current use of insulin (HCC)   Patient was evaluated and treated. All patient's and/or POA's questions/concerns addressed on today's visit. Toenails 1-5 debrided in length and girth without incident. Continue soft, supportive shoe gear daily. Report any pedal injuries to medical professional. Call office if there are any questions/concerns. -Patient/POA to call should there be question/concern in the interim.   Return in about 9 weeks (around 06/21/2024).  Katherine Yu, DPM      [place] LOCATION: [address] [place] [address]                   Office [phone]   [id number] LOCATION: [address] [place], [place] [address] Office [phone]

## 2024-05-17 ENCOUNTER — Other Ambulatory Visit

## 2024-05-17 DIAGNOSIS — E782 Mixed hyperlipidemia: Secondary | ICD-10-CM

## 2024-05-17 DIAGNOSIS — N1831 Chronic kidney disease, stage 3a: Secondary | ICD-10-CM

## 2024-05-17 LAB — BASIC METABOLIC PANEL WITH GFR
BUN: 17 mg/dL (ref 6–23)
CO2: 29 meq/L (ref 19–32)
Calcium: 10.2 mg/dL (ref 8.4–10.5)
Chloride: 101 meq/L (ref 96–112)
Creatinine, Ser: 1.15 mg/dL (ref 0.40–1.20)
GFR: 48.05 mL/min — ABNORMAL LOW (ref >60.00)
Glucose, Bld: 149 mg/dL — ABNORMAL HIGH (ref 70–99)
Potassium: 4.1 meq/L (ref 3.5–5.1)
Sodium: 141 meq/L (ref 135–145)

## 2024-05-17 LAB — LIPID PANEL
Cholesterol: 169 mg/dL (ref 0–200)
HDL: 40.5 mg/dL (ref >39.00)
LDL Cholesterol: 92 mg/dL (ref 0–99)
NonHDL: 128.17
Total CHOL/HDL Ratio: 4
Triglycerides: 179 mg/dL — ABNORMAL HIGH (ref 0.0–149.0)
VLDL: 35.8 mg/dL (ref 0.0–40.0)

## 2024-05-20 ENCOUNTER — Ambulatory Visit: Payer: Self-pay | Admitting: Family

## 2024-05-20 DIAGNOSIS — R944 Abnormal results of kidney function studies: Secondary | ICD-10-CM

## 2024-05-20 DIAGNOSIS — E782 Mixed hyperlipidemia: Secondary | ICD-10-CM

## 2024-05-22 ENCOUNTER — Other Ambulatory Visit

## 2024-05-22 ENCOUNTER — Other Ambulatory Visit (HOSPITAL_COMMUNITY): Payer: Self-pay

## 2024-05-22 DIAGNOSIS — R944 Abnormal results of kidney function studies: Secondary | ICD-10-CM

## 2024-05-22 MED ORDER — LOVASTATIN 40 MG PO TABS
40.0000 mg | ORAL_TABLET | Freq: Every day | ORAL | 3 refills | Status: AC
  Filled 2024-05-22: qty 90, 90d supply, fill #0
  Filled 2024-07-10 – 2024-08-16 (×2): qty 90, 90d supply, fill #1
  Filled 2024-10-02 – 2024-10-11 (×3): qty 90, 90d supply, fill #2

## 2024-05-23 ENCOUNTER — Ambulatory Visit: Payer: Self-pay | Admitting: Family

## 2024-05-23 LAB — URINALYSIS W MICROSCOPIC + REFLEX CULTURE
Bacteria, UA: NONE SEEN /HPF
Bilirubin Urine: NEGATIVE
Glucose, UA: NEGATIVE
Hgb urine dipstick: NEGATIVE
Hyaline Cast: NONE SEEN /LPF
Ketones, ur: NEGATIVE
Leukocyte Esterase: NEGATIVE
Nitrites, Initial: NEGATIVE
Protein, ur: NEGATIVE
RBC / HPF: NONE SEEN /HPF (ref 0–2)
Specific Gravity, Urine: 1.009 (ref 1.001–1.035)
Squamous Epithelial / HPF: NONE SEEN /HPF (ref <=5)
WBC, UA: NONE SEEN /HPF (ref 0–5)
pH: 6.5 (ref 5.0–8.0)

## 2024-05-23 LAB — NO CULTURE INDICATED

## 2024-05-29 NOTE — Progress Notes (Signed)
 Remote PPM Transmission

## 2024-06-07 DIAGNOSIS — H40011 Open angle with borderline findings, low risk, right eye: Secondary | ICD-10-CM | POA: Diagnosis not present

## 2024-06-07 DIAGNOSIS — H524 Presbyopia: Secondary | ICD-10-CM | POA: Diagnosis not present

## 2024-06-07 DIAGNOSIS — G35D Multiple sclerosis, unspecified: Secondary | ICD-10-CM | POA: Diagnosis not present

## 2024-06-07 DIAGNOSIS — H401121 Primary open-angle glaucoma, left eye, mild stage: Secondary | ICD-10-CM | POA: Diagnosis not present

## 2024-06-07 DIAGNOSIS — H25813 Combined forms of age-related cataract, bilateral: Secondary | ICD-10-CM | POA: Diagnosis not present

## 2024-06-07 LAB — OPHTHALMOLOGY REPORT-SCANNED

## 2024-06-28 NOTE — Progress Notes (Signed)
 Katherine Yu                                          MRN: [id number]   06/28/2024   The [facility] Quality Team Specialist reviewed this patient medical record for the purposes of chart review for care gap closure. The following were reviewed: chart review for care gap closure-controlling blood pressure.    [facility] Quality Team

## 2024-07-02 ENCOUNTER — Other Ambulatory Visit: Payer: Self-pay | Admitting: Medical Genetics

## 2024-07-08 ENCOUNTER — Ambulatory Visit
Admission: RE | Admit: 2024-07-08 | Discharge: 2024-07-08 | Disposition: A | Discharge status: Home / Self Care | Source: Ambulatory Visit | Attending: Family | Admitting: Family

## 2024-07-08 ENCOUNTER — Ambulatory Visit: Payer: Self-pay | Admitting: Family

## 2024-07-08 ENCOUNTER — Ambulatory Visit
Admission: RE | Admit: 2024-07-08 | Discharge: 2024-07-08 | Disposition: A | Source: Ambulatory Visit | Attending: Family

## 2024-07-08 DIAGNOSIS — Z780 Asymptomatic menopausal state: Secondary | ICD-10-CM | POA: Insufficient documentation

## 2024-07-08 DIAGNOSIS — Z1231 Encounter for screening mammogram for malignant neoplasm of breast: Secondary | ICD-10-CM

## 2024-07-10 ENCOUNTER — Other Ambulatory Visit (HOSPITAL_COMMUNITY): Payer: Self-pay

## 2024-07-10 ENCOUNTER — Ambulatory Visit

## 2024-07-10 ENCOUNTER — Other Ambulatory Visit

## 2024-07-10 ENCOUNTER — Other Ambulatory Visit: Payer: Self-pay

## 2024-07-11 ENCOUNTER — Other Ambulatory Visit (HOSPITAL_COMMUNITY): Payer: Self-pay

## 2024-07-16 ENCOUNTER — Other Ambulatory Visit (HOSPITAL_COMMUNITY): Payer: Self-pay

## 2024-07-16 MED ORDER — CLOTRIMAZOLE-BETAMETHASONE 1-0.05 % EX CREA
TOPICAL_CREAM | CUTANEOUS | 0 refills | Status: AC
  Filled 2024-07-16: qty 30, 15d supply, fill #0
  Filled 2024-07-16: qty 30, 28d supply, fill #0

## 2024-07-17 ENCOUNTER — Other Ambulatory Visit: Payer: Self-pay

## 2024-07-22 ENCOUNTER — Ambulatory Visit: Admitting: Podiatry

## 2024-07-22 ENCOUNTER — Encounter: Payer: Self-pay | Admitting: Podiatry

## 2024-07-22 ENCOUNTER — Ambulatory Visit (INDEPENDENT_AMBULATORY_CARE_PROVIDER_SITE_OTHER): Payer: 59

## 2024-07-22 DIAGNOSIS — E118 Type 2 diabetes mellitus with unspecified complications: Secondary | ICD-10-CM

## 2024-07-22 DIAGNOSIS — R002 Palpitations: Secondary | ICD-10-CM | POA: Diagnosis not present

## 2024-07-22 DIAGNOSIS — L84 Corns and callosities: Secondary | ICD-10-CM

## 2024-07-22 LAB — CUP PACEART REMOTE DEVICE CHECK
Battery Remaining Longevity: 120 mo
Battery Voltage: 3 V
Brady Statistic AP VP Percent: 20.89 %
Brady Statistic AP VS Percent: 0 %
Brady Statistic AS VP Percent: 79.1 %
Brady Statistic AS VS Percent: 0.01 %
Brady Statistic RA Percent Paced: 20.81 %
Brady Statistic RV Percent Paced: 99.99 %
Date Time Interrogation Session: 20251116202709
Implantable Lead Connection Status: 753985
Implantable Lead Connection Status: 753985
Implantable Lead Implant Date: 20220815
Implantable Lead Implant Date: 20220815
Implantable Lead Location: 753859
Implantable Lead Location: 753860
Implantable Lead Model: 3830
Implantable Lead Model: 5076
Implantable Pulse Generator Implant Date: 20220815
Lead Channel Impedance Value: 323 Ohm
Lead Channel Impedance Value: 399 Ohm
Lead Channel Impedance Value: 437 Ohm
Lead Channel Impedance Value: 722 Ohm
Lead Channel Pacing Threshold Amplitude: 0.5 V
Lead Channel Pacing Threshold Amplitude: 0.875 V
Lead Channel Pacing Threshold Pulse Width: 0.4 ms
Lead Channel Pacing Threshold Pulse Width: 0.4 ms
Lead Channel Sensing Intrinsic Amplitude: 1 mV
Lead Channel Sensing Intrinsic Amplitude: 1 mV
Lead Channel Sensing Intrinsic Amplitude: 10.25 mV
Lead Channel Sensing Intrinsic Amplitude: 5.5 mV
Lead Channel Setting Pacing Amplitude: 1.5 V
Lead Channel Setting Pacing Amplitude: 2 V
Lead Channel Setting Pacing Pulse Width: 0.4 ms
Lead Channel Setting Sensing Sensitivity: 0.9 mV
Zone Setting Status: 755011

## 2024-07-24 ENCOUNTER — Ambulatory Visit: Payer: Self-pay | Admitting: Cardiology

## 2024-07-24 NOTE — Progress Notes (Signed)
 Remote PPM Transmission

## 2024-07-28 NOTE — Progress Notes (Signed)
  Subjective:  Patient ID: Katherine Yu, female    DOB: 1951-08-12,  MRN: [id number]  Katherine Yu presents to clinic today for preventative diabetic foot care and corn(s) left second digit and painful mycotic toenails that are difficult to trim. Painful toenails interfere with ambulation. Aggravating factors include wearing enclosed shoe gear. Pain is relieved with periodic professional debridement. Painful corns are aggravated when weightbearing when wearing enclosed shoe gear. Pain is relieved with periodic professional debridement.  Chief Complaint  Patient presents with   Diabetes    A1c is less than 7 she reports. FNP Corwin is her PCP. Last visit was in Nov.    New problem(s): None.   PCP is Corwin Antu, FNP.  Allergies  Allergen Reactions   Niacin Anaphylaxis, Hives, Itching and Rash   Crestor  [Rosuvastatin ] Other (See Comments)    Joint pain    Atorvastatin Other (See Comments)    myalgias   Simvastatin Other (See Comments)    Muscle aches    Review of Systems: Negative except as noted in the HPI.  Objective: There were no vitals filed for this visit. Katherine Yu is a pleasant 73 y.o. female WD, WN in NAD. AAO x 3.  Vascular Examination: Capillary refill time immediate b/l. Palpable pedal pulses. Pedal hair present b/l. Pedal edema absent. No pain with calf compression b/l. Skin temperature gradient WNL b/l. No cyanosis or clubbing b/l. No ischemia or gangrene noted b/l.   Neurological Examination: Sensation grossly intact b/l with 10 gram monofilament. Vibratory sensation intact b/l.   Dermatological Examination: Pedal skin with normal turgor, texture and tone b/l.  No open wounds. No interdigital macerations.   Nondystrophic toenails 1-5 bilaterally.   Hyperkeratotic lesion(s) medial DIPJ of left second digit.  No erythema, no edema, no drainage, no fluctuance.  Musculoskeletal Examination: Muscle strength 5/5 to all lower extremity muscle groups  bilaterally. No pain, crepitus or joint limitation noted with ROM b/l LE. No gross bony pedal deformities b/l. Patient ambulates independently without assistive aids.  Radiographs: None  Assessment/Plan: 1. Corns   2. Type 2 diabetes mellitus with complication, without long-term current use of insulin (HCC)   -Patient was evaluated today. All questions/concerns addressed on today's visit. -Continue foot and shoe inspections daily. Monitor blood glucose per PCP/Endocrinologist's recommendations. -Patient to continue soft, supportive shoe gear daily. -Corn(s) medial DIPJ of left second digit pared utilizing sterile scalpel blade without complication or incident. Total number debrided=1. -As a courtesy, toenails 1-5 b/l were debrided in length and girth with sterile nail nippers and dremel file without incident. -Patient/POA to call should there be question/concern in the interim.  Return in about 3 months (around 10/22/2024).  Delon LITTIE Merlin, DPM      [place] LOCATION: [address], [place] [address]                   Office [phone]   [facility] LOCATION: [address] [place], [place] [address] Office [phone]

## 2024-07-29 DIAGNOSIS — N8111 Cystocele, midline: Secondary | ICD-10-CM | POA: Diagnosis not present

## 2024-07-29 DIAGNOSIS — Z01419 Encounter for gynecological examination (general) (routine) without abnormal findings: Secondary | ICD-10-CM | POA: Diagnosis not present

## 2024-07-29 DIAGNOSIS — N814 Uterovaginal prolapse, unspecified: Secondary | ICD-10-CM | POA: Diagnosis not present

## 2024-08-12 ENCOUNTER — Other Ambulatory Visit

## 2024-08-12 DIAGNOSIS — E782 Mixed hyperlipidemia: Secondary | ICD-10-CM | POA: Diagnosis not present

## 2024-08-12 DIAGNOSIS — R944 Abnormal results of kidney function studies: Secondary | ICD-10-CM

## 2024-08-12 LAB — BASIC METABOLIC PANEL WITH GFR
BUN: 26 mg/dL — ABNORMAL HIGH (ref 6–23)
CO2: 29 meq/L (ref 19–32)
Calcium: 9.9 mg/dL (ref 8.4–10.5)
Chloride: 101 meq/L (ref 96–112)
Creatinine, Ser: 1.06 mg/dL (ref 0.40–1.20)
GFR: 52.89 mL/min — ABNORMAL LOW (ref >60.00)
Glucose, Bld: 133 mg/dL — ABNORMAL HIGH (ref 70–99)
Potassium: 4 meq/L (ref 3.5–5.1)
Sodium: 141 meq/L (ref 135–145)

## 2024-08-12 LAB — LIPID PANEL
Cholesterol: 131 mg/dL (ref 0–200)
HDL: 37.4 mg/dL — ABNORMAL LOW (ref >39.00)
LDL Cholesterol: 62 mg/dL (ref 0–99)
NonHDL: 93.74
Total CHOL/HDL Ratio: 4
Triglycerides: 160 mg/dL — ABNORMAL HIGH (ref 0.0–149.0)
VLDL: 32 mg/dL (ref 0.0–40.0)

## 2024-08-15 ENCOUNTER — Other Ambulatory Visit (HOSPITAL_COMMUNITY): Payer: Self-pay

## 2024-08-16 ENCOUNTER — Encounter: Payer: Self-pay | Admitting: Family

## 2024-08-16 ENCOUNTER — Ambulatory Visit: Admitting: Family

## 2024-08-16 VITALS — BP 136/82 | HR 73 | Temp 98.0°F | Ht 66.0 in | Wt 180.6 lb

## 2024-08-16 DIAGNOSIS — E119 Type 2 diabetes mellitus without complications: Secondary | ICD-10-CM

## 2024-08-16 LAB — COMPREHENSIVE METABOLIC PANEL WITH GFR
ALT: 22 U/L (ref 0–35)
AST: 20 U/L (ref 0–37)
Albumin: 4.6 g/dL (ref 3.5–5.2)
Alkaline Phosphatase: 59 U/L (ref 39–117)
BUN: 19 mg/dL (ref 6–23)
CO2: 31 meq/L (ref 19–32)
Calcium: 10.5 mg/dL (ref 8.4–10.5)
Chloride: 101 meq/L (ref 96–112)
Creatinine, Ser: 1.09 mg/dL (ref 0.40–1.20)
GFR: 51.15 mL/min — ABNORMAL LOW (ref >60.00)
Glucose, Bld: 130 mg/dL — ABNORMAL HIGH (ref 70–99)
Potassium: 4.1 meq/L (ref 3.5–5.1)
Sodium: 141 meq/L (ref 135–145)
Total Bilirubin: 0.2 mg/dL (ref 0.2–1.2)
Total Protein: 7.6 g/dL (ref 6.0–8.3)

## 2024-08-16 LAB — HEMOGLOBIN A1C: Hgb A1c MFr Bld: 6.5 % (ref 4.6–6.5)

## 2024-08-16 NOTE — Progress Notes (Signed)
 Established Patient Office Visit  Subjective:      CC:  Chief Complaint  Patient presents with   Medical Management of Chronic Issues    HPI: Katherine Yu is a 73 y.o. female presenting on 08/16/2024 for Medical Management of Chronic Issues .  Discussed the use of AI scribe software for clinical note transcription with the patient, who gave verbal consent to proceed.  History of Present Illness Katherine Yu is a 73 year old female with multiple sclerosis who presents for a routine follow-up visit.  She has a history of multiple sclerosis, which she describes as stable. She manages associated nausea with ginger tea and occasionally uses nausea medication as needed. She is not currently on any MS medications due to previous liver function issues. Her liver function has not been monitored since March. She is now seeing a nurse practitioner more frequently for her MS management.  Her medication regimen includes metoprolol , famotidine as needed, lovastatin , baby aspirin  daily, vitamin D , hydrochlorothiazide , and Maxalt  as needed. She has switched from using melatonin to an [employer] product called 'Sleep Now' for sleep, as she found melatonin less effective over time.  Her recent cholesterol labs showed an LDL of 62 (previously 92) and a total cholesterol of 131 (previously 169) over the past six months. She is on lovastatin  for cholesterol management.  Her kidney function has shown a consistent GFR in the fifties. She is undergoing changes in her healthcare team, with her electrophysiologist moving and her GYN transitioning to a new practice.         Social history:  Relevant past medical, surgical, family and social history reviewed and updated as indicated. Interim medical history since our last visit reviewed.  Allergies and medications reviewed and updated.  DATA REVIEWED: CHART IN EPIC     ROS: Negative unless specifically indicated above in HPI.   Current  Medications[1]        Objective:        BP 136/82 (BP Location: Left Arm, Patient Position: Sitting, Cuff Size: Normal)   Pulse 73   Temp 98 F (36.7 C) (Temporal)   Ht 5' 6 (1.676 m)   Wt 180 lb 9.6 oz (81.9 kg)   SpO2 98%   BMI 29.15 kg/m   Physical Exam   Wt Readings from Last 3 Encounters:  08/16/24 180 lb 9.6 oz (81.9 kg)  04/16/24 181 lb (82.1 kg)  02/15/24 181 lb (82.1 kg)    Physical Exam Vitals reviewed.  Constitutional:      General: She is not in acute distress.    Appearance: Normal appearance. She is normal weight. She is not ill-appearing, toxic-appearing or diaphoretic.  HENT:     Head: Normocephalic.  Cardiovascular:     Rate and Rhythm: Normal rate and regular rhythm.  Pulmonary:     Effort: Pulmonary effort is normal.     Breath sounds: Normal breath sounds.  Musculoskeletal:        General: Normal range of motion.     Right lower leg: No edema.     Left lower leg: No edema.  Neurological:     General: No focal deficit present.     Mental Status: She is alert and oriented to person, place, and time. Mental status is at baseline.  Psychiatric:        Mood and Affect: Mood normal.        Behavior: Behavior normal.        Thought Content: Thought  content normal.        Judgment: Judgment normal.          Results LABS LDL: 62 mg/dL (87/91/7974) Total Cholesterol: 131 mg/dL (87/91/7974)  Assessment & Plan:   Assessment and Plan Assessment & Plan c   Type 2 diabetes mellitus A1c levels have been slightly increasing. Regular monitoring is necessary due to prediabetic status. - Performed point of care A1c test today  Essential hypertension Blood pressure is well-controlled with current medication regimen.  Chronic kidney disease associated with hypertension and type 2 diabetes mellitus Kidney function is stable with GFR consistently in the fifties, which is normal with age. Regular monitoring is necessary due to hypertension  and diabetes. - Continue to monitor kidney function periodically  Mixed hyperlipidemia Cholesterol levels have improved significantly with current treatment. LDL decreased from 92 to 62, and total cholesterol decreased from 169 to 131. Liver function tests have not been checked since March, but are important to monitor due to statin use and previous liver issues with MS medications. - Ordered liver function tests today  Multiple sclerosis Condition is well-managed with no new symptoms reported. Previous MS medications caused liver issues, hence current monitoring of liver function. - Continue current management and monitor liver function  General health maintenance Routine health maintenance is ongoing with regular monitoring of A1c, kidney function, and liver function due to current medications and conditions. - Scheduled next physical exam in June        Return in about 6 months (around 02/14/2025) for f/u diabetes.     Ginger Patrick, MSN, APRN, FNP-C [facility]        [1]  Current Outpatient Medications:    aspirin  EC 81 MG tablet, Take 1 tablet (81 mg total) by mouth daily. Swallow whole., Disp: , Rfl:    benazepril  (LOTENSIN ) 40 MG tablet, Take 1 tablet (40 mg) by mouth daily., Disp: 90 tablet, Rfl: 3   bismuth subsalicylate (PEPTO BISMOL) 262 MG/15ML suspension, Take 30 mLs by mouth every 4 (four) hours as needed for indigestion., Disp: , Rfl:    Cholecalciferol (VITAMIN D3) 75 MCG (3000 UT) TABS, Take 2,000 mg by mouth daily., Disp: , Rfl:    clotrimazole -betamethasone  (LOTRISONE ) cream, Apply to affected area 2 times a day for 2-4 weeks, Disp: 30 g, Rfl: 0   clotrimazole -betamethasone  (LOTRISONE ) cream, Apply to affected area 2 times a day for 2-4 weeks, Disp: 30 g, Rfl: 0   Cranberry-Vitamin C-Probiotic (AZO CRANBERRY) 250-30 MG TABS, Take by mouth., Disp: , Rfl:    famotidine (PEPCID) 20 MG tablet, Take 20 mg by mouth daily as needed for  heartburn or indigestion., Disp: , Rfl:    hydrochlorothiazide  (HYDRODIURIL ) 25 MG tablet, Take 1 tablet  by mouth daily., Disp: 90 tablet, Rfl: 1   hydrOXYzine  (ATARAX ) 25 MG tablet, Take 1 tablet (25 mg total) by mouth every 6 (six) hours as needed for itching., Disp: 30 tablet, Rfl: 5   ibuprofen (ADVIL) 200 MG tablet, Take 400 mg by mouth every 6 (six) hours as needed for mild pain., Disp: , Rfl:    lovastatin  (MEVACOR ) 40 MG tablet, Take 1 tablet (40 mg total) by mouth at bedtime., Disp: 90 tablet, Rfl: 3   meclizine (ANTIVERT) 25 MG tablet, Take 25 mg by mouth 3 (three) times daily as needed for dizziness., Disp: , Rfl:    metoprolol  succinate (TOPROL -XL) 100 MG 24 hr tablet, Take 1 tablet (100 mg total) by mouth daily. Take with  or immediately following a meal., Disp: 90 tablet, Rfl: 3   ondansetron  (ZOFRAN -ODT) 8 MG disintegrating tablet, Dissolve 1 tablet (8 mg total) by mouth every 8 (eight) hours as needed for nausea., Disp: 60 tablet, Rfl: 12   OVER THE COUNTER MEDICATION, Take 1 Dose by mouth at bedtime., Disp: , Rfl:    rizatriptan  (MAXALT ) 10 MG tablet, Take 1 tablet by mouth as needed for migraine. May repeat in 2 hours if needed, Disp: 10 tablet, Rfl: 9

## 2024-08-19 ENCOUNTER — Ambulatory Visit: Payer: Self-pay | Admitting: Family

## 2024-09-18 ENCOUNTER — Ambulatory Visit: Payer: PPO | Admitting: Family Medicine

## 2024-10-03 ENCOUNTER — Other Ambulatory Visit: Payer: Self-pay

## 2024-10-07 NOTE — Patient Instructions (Signed)
 Below is our plan:  We will continue current treatment   Please make sure you are staying well hydrated. I recommend 50-60 ounces daily. Well balanced diet and regular exercise encouraged. Consistent sleep schedule with 6-8 hours recommended.   Please continue follow up with care team as directed.   Follow up with me or Dr Vear in 1 year   You may receive a survey regarding today's visit. I encourage you to leave honest feed back as I do use this information to improve patient care. Thank you for seeing me today!

## 2024-10-08 ENCOUNTER — Encounter: Payer: Self-pay | Admitting: Family Medicine

## 2024-10-08 ENCOUNTER — Other Ambulatory Visit (HOSPITAL_COMMUNITY): Payer: Self-pay

## 2024-10-08 ENCOUNTER — Ambulatory Visit: Admitting: Family Medicine

## 2024-10-08 VITALS — BP 150/77 | Ht 66.0 in | Wt 185.5 lb

## 2024-10-08 DIAGNOSIS — G35D Multiple sclerosis, unspecified: Secondary | ICD-10-CM

## 2024-10-08 DIAGNOSIS — F5104 Psychophysiologic insomnia: Secondary | ICD-10-CM

## 2024-10-08 DIAGNOSIS — R269 Unspecified abnormalities of gait and mobility: Secondary | ICD-10-CM

## 2024-10-08 DIAGNOSIS — R5383 Other fatigue: Secondary | ICD-10-CM | POA: Diagnosis not present

## 2024-10-08 DIAGNOSIS — R3915 Urgency of urination: Secondary | ICD-10-CM

## 2024-10-08 MED ORDER — RIZATRIPTAN BENZOATE 10 MG PO TABS
10.0000 mg | ORAL_TABLET | ORAL | 9 refills | Status: AC | PRN
  Filled 2024-10-08 – 2024-10-11 (×2): qty 10, 30d supply, fill #0

## 2024-10-08 MED ORDER — HYDROXYZINE HCL 25 MG PO TABS
25.0000 mg | ORAL_TABLET | Freq: Four times a day (QID) | ORAL | 5 refills | Status: AC | PRN
  Filled 2024-10-08: qty 30, 8d supply, fill #0
  Filled 2024-10-11: qty 30, 8d supply, fill #1

## 2024-10-09 ENCOUNTER — Other Ambulatory Visit (HOSPITAL_COMMUNITY): Payer: Self-pay

## 2024-10-10 ENCOUNTER — Other Ambulatory Visit (HOSPITAL_COMMUNITY): Payer: Self-pay

## 2024-10-10 ENCOUNTER — Other Ambulatory Visit: Payer: Self-pay

## 2024-10-11 ENCOUNTER — Other Ambulatory Visit: Payer: Self-pay

## 2024-10-11 ENCOUNTER — Other Ambulatory Visit (HOSPITAL_COMMUNITY): Payer: Self-pay

## 2024-10-21 ENCOUNTER — Ambulatory Visit

## 2024-10-28 ENCOUNTER — Ambulatory Visit: Admitting: Podiatry

## 2025-01-20 ENCOUNTER — Ambulatory Visit

## 2025-04-17 ENCOUNTER — Ambulatory Visit

## 2025-04-18 ENCOUNTER — Ambulatory Visit

## 2025-04-21 ENCOUNTER — Ambulatory Visit

## 2025-07-21 ENCOUNTER — Ambulatory Visit

## 2025-10-13 ENCOUNTER — Ambulatory Visit: Admitting: Family Medicine

## 2025-10-20 ENCOUNTER — Ambulatory Visit

## 2026-01-19 ENCOUNTER — Ambulatory Visit
